# Patient Record
Sex: Male | Born: 1960 | Race: White | Hispanic: No | Marital: Single
Health system: Southern US, Community
[De-identification: ages and names within clinical notes are randomized; demographics above are authoritative.]

## PROBLEM LIST (undated history)

## (undated) ENCOUNTER — Emergency Department (HOSPITAL_COMMUNITY): Admission: EM | Payer: No Typology Code available for payment source | Source: Home / Self Care

## (undated) DIAGNOSIS — F1010 Alcohol abuse, uncomplicated: Secondary | ICD-10-CM

## (undated) DIAGNOSIS — F329 Major depressive disorder, single episode, unspecified: Secondary | ICD-10-CM

## (undated) DIAGNOSIS — F32A Depression, unspecified: Secondary | ICD-10-CM

## (undated) DIAGNOSIS — Z789 Other specified health status: Secondary | ICD-10-CM

## (undated) HISTORY — PX: HAND SURGERY: SHX662

## (undated) HISTORY — PX: NO PAST SURGERIES: SHX2092

---
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
## 2009-07-26 ENCOUNTER — Emergency Department (HOSPITAL_COMMUNITY): Admission: EM | Admit: 2009-07-26 | Discharge: 2009-07-26 | Payer: Self-pay | Admitting: Emergency Medicine

## 2009-08-04 ENCOUNTER — Ambulatory Visit (HOSPITAL_COMMUNITY): Admission: RE | Admit: 2009-08-04 | Discharge: 2009-08-04 | Payer: Self-pay | Admitting: Orthopedic Surgery

## 2010-08-15 LAB — CBC
MCV: 89.9 fL (ref 78.0–100.0)
Platelets: 226 10*3/uL (ref 150–400)
RDW: 14.8 % (ref 11.5–15.5)
WBC: 6.5 10*3/uL (ref 4.0–10.5)

## 2012-12-02 ENCOUNTER — Emergency Department (HOSPITAL_COMMUNITY)
Admission: EM | Admit: 2012-12-02 | Discharge: 2012-12-03 | Disposition: A | Discharge status: Home / Self Care | Payer: No Typology Code available for payment source | Attending: Emergency Medicine | Admitting: Emergency Medicine

## 2012-12-02 ENCOUNTER — Emergency Department (HOSPITAL_COMMUNITY): Payer: No Typology Code available for payment source

## 2012-12-02 ENCOUNTER — Encounter (HOSPITAL_COMMUNITY): Payer: Self-pay | Admitting: *Deleted

## 2012-12-02 DIAGNOSIS — R413 Other amnesia: Secondary | ICD-10-CM | POA: Insufficient documentation

## 2012-12-02 DIAGNOSIS — S62644A Nondisplaced fracture of proximal phalanx of right ring finger, initial encounter for closed fracture: Secondary | ICD-10-CM

## 2012-12-02 DIAGNOSIS — M542 Cervicalgia: Secondary | ICD-10-CM | POA: Insufficient documentation

## 2012-12-02 DIAGNOSIS — Y92410 Unspecified street and highway as the place of occurrence of the external cause: Secondary | ICD-10-CM | POA: Insufficient documentation

## 2012-12-02 DIAGNOSIS — R1032 Left lower quadrant pain: Secondary | ICD-10-CM | POA: Insufficient documentation

## 2012-12-02 DIAGNOSIS — S59919A Unspecified injury of unspecified forearm, initial encounter: Secondary | ICD-10-CM | POA: Insufficient documentation

## 2012-12-02 DIAGNOSIS — F1010 Alcohol abuse, uncomplicated: Secondary | ICD-10-CM | POA: Insufficient documentation

## 2012-12-02 DIAGNOSIS — Y9389 Activity, other specified: Secondary | ICD-10-CM | POA: Insufficient documentation

## 2012-12-02 DIAGNOSIS — S62329A Displaced fracture of shaft of unspecified metacarpal bone, initial encounter for closed fracture: Secondary | ICD-10-CM | POA: Insufficient documentation

## 2012-12-02 DIAGNOSIS — S62356A Nondisplaced fracture of shaft of fifth metacarpal bone, right hand, initial encounter for closed fracture: Secondary | ICD-10-CM

## 2012-12-02 DIAGNOSIS — F17200 Nicotine dependence, unspecified, uncomplicated: Secondary | ICD-10-CM | POA: Insufficient documentation

## 2012-12-02 DIAGNOSIS — S59909A Unspecified injury of unspecified elbow, initial encounter: Secondary | ICD-10-CM | POA: Insufficient documentation

## 2012-12-02 DIAGNOSIS — F10129 Alcohol abuse with intoxication, unspecified: Secondary | ICD-10-CM

## 2012-12-02 DIAGNOSIS — S298XXA Other specified injuries of thorax, initial encounter: Secondary | ICD-10-CM | POA: Insufficient documentation

## 2012-12-02 DIAGNOSIS — M546 Pain in thoracic spine: Secondary | ICD-10-CM | POA: Insufficient documentation

## 2012-12-02 DIAGNOSIS — S62640A Nondisplaced fracture of proximal phalanx of right index finger, initial encounter for closed fracture: Secondary | ICD-10-CM

## 2012-12-02 DIAGNOSIS — R404 Transient alteration of awareness: Secondary | ICD-10-CM | POA: Insufficient documentation

## 2012-12-02 DIAGNOSIS — M25519 Pain in unspecified shoulder: Secondary | ICD-10-CM | POA: Insufficient documentation

## 2012-12-02 DIAGNOSIS — R0789 Other chest pain: Secondary | ICD-10-CM | POA: Insufficient documentation

## 2012-12-02 DIAGNOSIS — Y998 Other external cause status: Secondary | ICD-10-CM | POA: Insufficient documentation

## 2012-12-02 DIAGNOSIS — S6990XA Unspecified injury of unspecified wrist, hand and finger(s), initial encounter: Secondary | ICD-10-CM | POA: Insufficient documentation

## 2012-12-02 DIAGNOSIS — IMO0002 Reserved for concepts with insufficient information to code with codable children: Secondary | ICD-10-CM | POA: Insufficient documentation

## 2012-12-02 DIAGNOSIS — F10229 Alcohol dependence with intoxication, unspecified: Secondary | ICD-10-CM | POA: Insufficient documentation

## 2012-12-02 HISTORY — DX: Alcohol abuse, uncomplicated: F10.10

## 2012-12-02 LAB — COMPREHENSIVE METABOLIC PANEL
ALT: 25 U/L (ref 0–53)
BUN: 8 mg/dL (ref 6–23)
Creatinine, Ser: 0.69 mg/dL (ref 0.50–1.35)
GFR calc Af Amer: >90 mL/min (ref >90)
Glucose, Bld: 80 mg/dL (ref 70–99)
Potassium: 3.8 mEq/L (ref 3.5–5.1)
Sodium: 142 mEq/L (ref 135–145)
Total Bilirubin: 0.3 mg/dL (ref 0.3–1.2)

## 2012-12-02 LAB — CBC WITH DIFFERENTIAL/PLATELET
HCT: 38.6 % — ABNORMAL LOW (ref 39.0–52.0)
Hemoglobin: 13.5 g/dL (ref 13.0–17.0)
MCH: 31.5 pg (ref 26.0–34.0)
MCHC: 35 g/dL (ref 30.0–36.0)
MCV: 90 fL (ref 78.0–100.0)
Neutro Abs: 4.7 10*3/uL (ref 1.7–7.7)
RDW: 14.3 % (ref 11.5–15.5)
WBC: 7.5 10*3/uL (ref 4.0–10.5)

## 2012-12-02 LAB — ETHANOL: Alcohol, Ethyl (B): 242 mg/dL — ABNORMAL HIGH (ref 0–11)

## 2012-12-02 MED ORDER — IOHEXOL 300 MG/ML  SOLN
100.0000 mL | Freq: Once | INTRAMUSCULAR | Status: AC | PRN
  Administered 2012-12-02: 100 mL via INTRAVENOUS
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
## 2012-12-03 ENCOUNTER — Encounter (HOSPITAL_COMMUNITY): Payer: Self-pay | Admitting: *Deleted

## 2012-12-03 ENCOUNTER — Emergency Department (HOSPITAL_COMMUNITY): Payer: No Typology Code available for payment source

## 2012-12-03 DIAGNOSIS — F10129 Alcohol abuse with intoxication, unspecified: Secondary | ICD-10-CM | POA: Diagnosis present

## 2012-12-03 MED ORDER — TRAMADOL HCL 50 MG PO TABS
50.0000 mg | ORAL_TABLET | Freq: Four times a day (QID) | ORAL | Status: DC | PRN
Start: 2012-12-03 — End: 2012-12-19

## 2012-12-03 NOTE — ED Notes (Signed)
Patient being taken to xray at this time.

## 2012-12-03 NOTE — Progress Notes (Signed)
Orthopedic Tech Progress Note Patient Details:  Andrew Rivas 10-21-1960 [id number] Ulna gutter splint with radial gutter applied to Right UE to stabilize 2nd 4th and 5th digits. Fractures in all 3. Patient tolerated application well. Care instructions explained. Patient was asked if he would like arm sling as well and stated he would be fine without one.  Ortho Devices Type of Ortho Device: Rad Gutter splint;Ulna gutter splint Ortho Device/Splint Location: Right UE Ortho Device/Splint Interventions: Application   Asia R Thompson 12/03/2012, 8:16 AM

## 2012-12-03 NOTE — ED Provider Notes (Addendum)
Patient has been sleeping throughout the night. He apparently took off his cervical collar without anybody. On exam, neck is nontender with normal range of motion. I do not feel he needs any additional imaging of the cervical spine and he is discharged.  Dione Booze, MD 12/03/12 [phone]  As he was getting ready to be discharged, patient started complaining of pain in his right hand which was noted to be swollen. He was sent for x-rays which showed a fracture through the fifth metacarpal with minimal angulation, and intra-articular fracture is of the proximal phalanges of the second third fingers. He was placed in an ulnar gutter splint and referred to hand surgery for followup. Prescription will be given for, call for pain.  Dione Booze, MD 12/03/12 [phone]
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
## 2012-12-03 NOTE — ED Notes (Signed)
Xray results are noted,  ERMD aware of fractures

## 2012-12-03 NOTE — ED Notes (Signed)
Patient is now alert and oriented.  He is complaining of pain in his right hand,  States he can barely move the hand due to pain.  Swelling is noted.  Xray ordered

## 2012-12-19 ENCOUNTER — Emergency Department (HOSPITAL_COMMUNITY)
Admission: EM | Admit: 2012-12-19 | Discharge: 2012-12-19 | Disposition: A | Discharge status: Home / Self Care | Payer: Self-pay | Attending: Emergency Medicine | Admitting: Emergency Medicine

## 2012-12-19 ENCOUNTER — Emergency Department (HOSPITAL_COMMUNITY): Payer: Self-pay

## 2012-12-19 ENCOUNTER — Encounter (HOSPITAL_COMMUNITY): Payer: Self-pay | Admitting: Emergency Medicine

## 2012-12-19 DIAGNOSIS — J3489 Other specified disorders of nose and nasal sinuses: Secondary | ICD-10-CM | POA: Insufficient documentation

## 2012-12-19 DIAGNOSIS — J069 Acute upper respiratory infection, unspecified: Secondary | ICD-10-CM | POA: Insufficient documentation

## 2012-12-19 DIAGNOSIS — F17200 Nicotine dependence, unspecified, uncomplicated: Secondary | ICD-10-CM | POA: Insufficient documentation

## 2012-12-19 DIAGNOSIS — R05 Cough: Secondary | ICD-10-CM | POA: Insufficient documentation

## 2012-12-19 DIAGNOSIS — R059 Cough, unspecified: Secondary | ICD-10-CM | POA: Insufficient documentation

## 2012-12-19 DIAGNOSIS — F1010 Alcohol abuse, uncomplicated: Secondary | ICD-10-CM | POA: Insufficient documentation

## 2012-12-19 MED ORDER — ALBUTEROL SULFATE HFA 108 (90 BASE) MCG/ACT IN AERS
2.0000 | INHALATION_SPRAY | RESPIRATORY_TRACT | Status: DC | PRN
  Administered 2012-12-19: 2 via RESPIRATORY_TRACT
  Filled 2012-12-19: qty 6.7

## 2012-12-19 NOTE — ED Provider Notes (Signed)
CSN: [id number]     Arrival date & time 12/19/12  0801 History     First MD Initiated Contact with Patient 12/19/12 [phone]     Chief Complaint  Patient presents with  . URI   (Consider location/radiation/quality/duration/timing/severity/associated sxs/prior Treatment) Patient is a 52 y.o. male presenting with URI. The history is provided by the patient. No language interpreter was used.  URI Presenting symptoms: congestion and cough   Severity:  Mild Onset quality:  Gradual Duration:  2 weeks Timing:  Intermittent Progression:  Partially resolved Chronicity:  New Relieved by:  Decongestant Worsened by:  Nothing tried Associated symptoms: sinus pain   Risk factors: chronic respiratory disease     Past Medical History  Diagnosis Date  . Alcohol abuse    Past Surgical History  Procedure Laterality Date  . Hand surgery Left    No family history on file. History  Substance Use Topics  . Smoking status: Current Every Day Smoker  . Smokeless tobacco: Not on file  . Alcohol Use: Yes     Comment: admits 2-3 beers daily but today had 12 pack    Review of Systems  HENT: Positive for congestion.   Respiratory: Positive for cough.   All other systems reviewed and are negative.    Allergies  Review of patient's allergies indicates no known allergies.  Home Medications   Current Outpatient Rx  Name  Route  Sig  Dispense  Refill  . guaiFENesin (MUCINEX) 600 MG 12 hr tablet   Oral   Take 1,200 mg by mouth 2 (two) times daily as needed for congestion.          BP 118/70  Pulse 82  Temp(Src) 98.7 F (37.1 C)  Resp 19  SpO2 92% Physical Exam  Nursing note and vitals reviewed. Constitutional: He is oriented to person, place, and time. He appears well-developed and well-nourished.  HENT:  Head: Normocephalic and atraumatic.  Right Ear: External ear normal.  Left Ear: External ear normal.  Nose: Nose normal.  Mouth/Throat: Oropharynx is clear and moist. No  oropharyngeal exudate.  Eyes: Conjunctivae and EOM are normal. Pupils are equal, round, and reactive to light. Right eye exhibits no discharge. Left eye exhibits no discharge. No scleral icterus.  Neck: Normal range of motion. Neck supple. No JVD present.  Cardiovascular: Normal rate, regular rhythm, normal heart sounds and intact distal pulses.  Exam reveals no gallop and no friction rub.   No murmur heard. Pulmonary/Chest: Effort normal and breath sounds normal. No respiratory distress. He has no wheezes. He has no rales. He exhibits no tenderness.  Abdominal: Soft. Bowel sounds are normal. He exhibits no distension and no mass. There is no tenderness. There is no rebound and no guarding.  Musculoskeletal: Normal range of motion. He exhibits no edema and no tenderness.  Neurological: He is alert and oriented to person, place, and time.  CN 3-12 intact  Skin: Skin is warm and dry.  Psychiatric: He has a normal mood and affect. His behavior is normal. Judgment and thought content normal.    ED Course   Procedures (including critical care time)  Labs Reviewed - No data to display Dg Chest 2 View  12/19/2012   *RADIOLOGY REPORT*  Clinical Data: Cough and shortness of breath.  CHEST - 2 VIEW  Comparison: Chest CT 12/02/2012.  Findings: The cardiac silhouette, mediastinal and hilar contours are normal and stable.  The lungs are clear.  No pleural effusion. The bony thorax is intact.  Remote ununited distal clavicle fracture on the right.  IMPRESSION: No acute cardiopulmonary findings.   Original Report Authenticated By: Rudie Meyer, M.D.   1. URI (upper respiratory infection)     MDM  Patient with URI, no evidence of pneumonia.  Will discharge to home with inhaler.  Recommend continuing mucinex.  Afebrile, PERC negative.  VSS.  Roxy Horseman, PA-C 12/19/12 0926  Roxy Horseman, PA-C 12/19/12 [phone]

## 2012-12-19 NOTE — ED Provider Notes (Signed)
Medical screening examination/treatment/procedure(s) were performed by non-physician practitioner and as supervising physician I was immediately available for consultation/collaboration.  Ethelda Chick, MD 12/19/12 [phone]

## 2012-12-19 NOTE — ED Notes (Signed)
[facility] and he has cold and congestion in his nose and chest sputum is yellow/ green he states states he thinks this cold is going around there he coughing alot

## 2016-07-24 ENCOUNTER — Emergency Department (HOSPITAL_COMMUNITY)
Admission: EM | Admit: 2016-07-24 | Discharge: 2016-07-24 | Discharge status: Against Medical Advice | Payer: No Typology Code available for payment source

## 2016-07-24 NOTE — ED Notes (Signed)
Pt. Stated that he was leaving. Pt. Walked out. Pt. Was called x3 for vitals.  Nurses aware.
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
## 2017-01-24 ENCOUNTER — Emergency Department (HOSPITAL_COMMUNITY)
Admission: EM | Admit: 2017-01-24 | Discharge: 2017-01-24 | Disposition: A | Discharge status: Home / Self Care | Payer: Self-pay | Attending: Emergency Medicine | Admitting: Emergency Medicine

## 2017-01-24 ENCOUNTER — Encounter (HOSPITAL_COMMUNITY): Payer: Self-pay | Admitting: Emergency Medicine

## 2017-01-24 DIAGNOSIS — F10920 Alcohol use, unspecified with intoxication, uncomplicated: Secondary | ICD-10-CM | POA: Insufficient documentation

## 2017-01-24 DIAGNOSIS — F17200 Nicotine dependence, unspecified, uncomplicated: Secondary | ICD-10-CM | POA: Insufficient documentation

## 2017-01-24 DIAGNOSIS — F1910 Other psychoactive substance abuse, uncomplicated: Secondary | ICD-10-CM | POA: Insufficient documentation

## 2017-01-24 NOTE — ED Notes (Signed)
Patient ambulated to bathroom with no assistance.

## 2017-01-24 NOTE — Discharge Instructions (Signed)
Substance Abuse Treatment Programs ° °Intensive Outpatient Programs °[facility]     °[address]      °[place], [place]                   °[phone]      ° °[facility] °[address] °[place], [place] °336-379-7146 ° °[facility]     °(Inpatient and outpatient)     °[address]           °[phone]   ° °[facility] °336-288-1484 (Suboxone and Methadone) ° °[address]      °[place], [place] [address]      °336-882-2125      ° °[address] °[place], [place] °852-3033 ° °[facility] (Outpatient/Inpatient, Chemical)    °(insurance only) [phone]      °       °[facility] (Groups & Residential) °[place], [place] °336-389-1413 ° °   °[facility]     °[address]     °[place], [place]      °336-389-1413      ° °[facility] (for caregivers and family) °[address] °[place], [place] °336-299-4655 ° ° ° ° ° °Residential Treatment Programs °[facility]      °[address], [place], [place] [address]  °([phone]      ° °[facility] °[address] [place], [place] [address] °919-419-1059 ° °[facility]        °336-248-8914      ° °[facility] °1-800-659-3381 ° °[facility]             °[address]                                         °[place], [place]                                                °877-615-2722 or 336-784-9470                              ° °[facility] [place] °[address] °[place], [address] °1.877.941.8954 ° °[facility]    °[address]      °[place], [place]     °336-273-5306      ° °[facility] °[address] °[place], [place] °336-285-9073 ° °[facility]   °[address]     °[place], [place] [address]     °336-899-1550      °Admissions: 8am-3pm M-F ° °[facility] °[address] °,  [place] °336-227-7417 ° °[facility]: Residential Program (90 Days)   °[place], [place]      °336-725-8389 or 800-758-6077    ° °[facility] °[place], [place] °(Walk in Hours over the weekend or by referral) ° °[facility] °[address], [place], [place] [address] °(336) [phone] ° °Crisis Mobile: [facility]:  1-877-626-1772 (for crisis response 24 hours a day) °[facility] Hotline:      1-800-256-2452 °Outpatient Psychiatry and Counseling ° °[facility]: Mobile Crisis   Management 24 hours:  1-877-626-1772 ° °[facility] sliding scale fee and walk in schedule: M-F 8am-12pm/1pm-3pm °[address]  °[place], [place] [address] °336-387-6161 ° °[facility] °[address] °[place], [place] [address] °336-703-9650 ° °[facility] (Former[facility] known [facility])- new patient walk-in appointments available Monday - Friday 8am -3pm.          °[address] °[place], [place] [address] °336-676-6840 or crisis line- 336-676-6905 ° °[facility]/ Intensive Outpatient Therapy Program °[address] °[place], [place] [address] °336-832-9804 ° °[facility]                  °Crisis Services      °336.641.4993      °[address]     °[place], [place] [address]                ° °[facility]   °[facility] °[phone] °[address] °[place], [place] [address] ° ° °[facility]          °[address]  °[place], [place] [address]       °(336) [phone] ° °[facility] °[address] °[place], [place] [address] °336-292-1510 ° °[facility]    °[address]    °[place], [place] [address]     °336-632-3505      ° °Parish McKinney, MD     °[address]     °[place] [address]     °336-282-1251     °  °[facility] °[address] °[place] [address] ° °[facility]     °[address]     °[place], [place]      °336-542-2076      ° °[facility] °Shamsher Ahluwalia, MD °[address] °[place], [place] [address] °336-420-9558 ° °[facility]     °[address]     °[place], [place] [address]     °336-274-1237      ° °[facility] °[address] °[place], [place] [address] °336-854-4450 °Resources for Temporary Residential Assistance/Crisis Centers ° °DAY CENTERS °[facility] °M-F 8am-3pm   °[address] [place], [place] [address]   336-332-0824 °Services include: laundry, barbering, support groups, case management, phone  & computer access, showers, AA/NA mtgs, mental health/substance abuse nurse, job skills class, disability information, VA assistance, spiritual classes, etc.  ° °HOMELESS SHELTERS ° °[facility]     °[facility]   °[address], [place]     °336.271.5959       °       °[facility] (women and children)       °[address] °[place], [place] [address] °336-275-0820 °[email or web address] for application and process °Application Required ° °[facility]   °[address]    °[place] [address]     °336.886.4922       °             °[facility] °[address] °[place],  [address] °336.273.5572 °336-235-0363(schedule application appt.) °Application Required ° °[facility] (women only)    °[address]     °[place],  [address]     °336-884-1039      °  Intake starts 6pm daily °Need valid ID, SSC, & Police report °[facility] [place] °[address] °[place], [place] °336-881-5420 °Application Required ° °[facility] (men only)     °[address]      °[place], [place]     °336.748.1962      ° °[facility] °(Pregnant women only) °[address] °[place], [place] °336-275-0206 ° °[facility]      °[address]      °[place], [place] [address]     °336-722-9951      °       °[facility] °[address] °[place], [place] °336-723-1848 °90 day commitment/SA/Application process ° °[facility](men only)     °[address]     °[place], [place]     °336-748-1962       °Check-in at 7pm     °       °[facility] °[address] °[place], [place] [address] °336-248-6684 °Men/Women/Women and Children must be there by 7 pm ° °[facility] °[place], [place] °336-722-8721                ° °
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
## 2017-01-24 NOTE — ED Triage Notes (Signed)
Pt comes from the bathroom at [employer] via EMS after bystanders called after finding him during closing asleep.  Pt reports drinking 24 beers and taking hydrocodone that was not prescribed to him.  Pt laughing and in no acute distress in triage. Vitals [facility]NL in route. Ambulatory on arrival.

## 2017-09-23 ENCOUNTER — Emergency Department (HOSPITAL_COMMUNITY)
Admission: EM | Admit: 2017-09-23 | Discharge: 2017-09-23 | Disposition: A | Discharge status: Home / Self Care | Payer: Self-pay | Attending: Emergency Medicine | Admitting: Emergency Medicine

## 2017-09-23 ENCOUNTER — Encounter (HOSPITAL_COMMUNITY): Payer: Self-pay

## 2017-09-23 ENCOUNTER — Emergency Department (HOSPITAL_COMMUNITY): Payer: Self-pay

## 2017-09-23 ENCOUNTER — Other Ambulatory Visit: Payer: Self-pay

## 2017-09-23 DIAGNOSIS — Z590 Homelessness: Secondary | ICD-10-CM | POA: Insufficient documentation

## 2017-09-23 DIAGNOSIS — R531 Weakness: Secondary | ICD-10-CM | POA: Insufficient documentation

## 2017-09-23 DIAGNOSIS — R2681 Unsteadiness on feet: Secondary | ICD-10-CM | POA: Insufficient documentation

## 2017-09-23 DIAGNOSIS — R569 Unspecified convulsions: Secondary | ICD-10-CM | POA: Insufficient documentation

## 2017-09-23 DIAGNOSIS — F17200 Nicotine dependence, unspecified, uncomplicated: Secondary | ICD-10-CM | POA: Insufficient documentation

## 2017-09-23 LAB — URINALYSIS, ROUTINE W REFLEX MICROSCOPIC
BILIRUBIN URINE: NEGATIVE
GLUCOSE, UA: NEGATIVE mg/dL
HGB URINE DIPSTICK: NEGATIVE
Leukocytes, UA: NEGATIVE
Nitrite: NEGATIVE
PH: 5.5 (ref 5.0–8.0)
PROTEIN: NEGATIVE mg/dL
Specific Gravity, Urine: 1.025 (ref 1.005–1.030)

## 2017-09-23 LAB — CBC WITH DIFFERENTIAL/PLATELET
BASOS ABS: 0 10*3/uL (ref 0.0–0.1)
Basophils Relative: 1 %
EOS PCT: 1 %
Eosinophils Absolute: 0.1 10*3/uL (ref 0.0–0.7)
HEMATOCRIT: 42.8 % (ref 39.0–52.0)
Hemoglobin: 15.1 g/dL (ref 13.0–17.0)
LYMPHS ABS: 1.2 10*3/uL (ref 0.7–4.0)
LYMPHS PCT: 14 %
MCH: 31.5 pg (ref 26.0–34.0)
MCHC: 35.3 g/dL (ref 30.0–36.0)
MCV: 89.4 fL (ref 78.0–100.0)
MONO ABS: 0.6 10*3/uL (ref 0.1–1.0)
Monocytes Relative: 7 %
NEUTROS ABS: 6.5 10*3/uL (ref 1.7–7.7)
Neutrophils Relative %: 77 %
Platelets: 268 10*3/uL (ref 150–400)
RBC: 4.79 MIL/uL (ref 4.22–5.81)
RDW: 14.6 % (ref 11.5–15.5)
WBC: 8.4 10*3/uL (ref 4.0–10.5)

## 2017-09-23 LAB — COMPREHENSIVE METABOLIC PANEL
ALT: 40 U/L (ref 17–63)
AST: 53 U/L — ABNORMAL HIGH (ref 15–41)
Albumin: 3.8 g/dL (ref 3.5–5.0)
Alkaline Phosphatase: 76 U/L (ref 38–126)
Anion gap: 15 (ref 5–15)
BILIRUBIN TOTAL: 0.5 mg/dL (ref 0.3–1.2)
BUN: 10 mg/dL (ref 6–20)
CO2: 22 mmol/L (ref 22–32)
CREATININE: 0.64 mg/dL (ref 0.61–1.24)
Calcium: 8.2 mg/dL — ABNORMAL LOW (ref 8.9–10.3)
Chloride: 105 mmol/L (ref 101–111)
Glucose, Bld: 82 mg/dL (ref 65–99)
POTASSIUM: 4.3 mmol/L (ref 3.5–5.1)
Sodium: 142 mmol/L (ref 135–145)
TOTAL PROTEIN: 7 g/dL (ref 6.5–8.1)

## 2017-09-23 LAB — MAGNESIUM: MAGNESIUM: 1.8 mg/dL (ref 1.7–2.4)

## 2017-09-23 LAB — ETHANOL: ALCOHOL ETHYL (B): 108 mg/dL — AB (ref <10)

## 2017-09-23 LAB — AMMONIA: Ammonia: 28 umol/L (ref 9–35)

## 2017-09-23 LAB — CBG MONITORING, ED: Glucose-Capillary: 75 mg/dL (ref 65–99)

## 2017-09-23 LAB — CK: CK TOTAL: 486 U/L — AB (ref 49–397)

## 2017-09-23 MED ORDER — LACTATED RINGERS IV BOLUS
1000.0000 mL | Freq: Once | INTRAVENOUS | Status: AC
  Administered 2017-09-23: 1000 mL via INTRAVENOUS

## 2017-09-23 MED ORDER — SODIUM CHLORIDE 0.9 % IV SOLN
1.0000 mg | Freq: Once | INTRAVENOUS | Status: AC
Start: 2017-09-23 — End: 2017-09-23
  Administered 2017-09-23: 1 mg via INTRAVENOUS
  Filled 2017-09-23: qty 0.2

## 2017-09-23 MED ORDER — LORAZEPAM 1 MG PO TABS
1.0000 mg | ORAL_TABLET | Freq: Once | ORAL | Status: AC
  Administered 2017-09-23: 1 mg via ORAL
  Filled 2017-09-23: qty 1

## 2017-09-23 MED ORDER — THIAMINE HCL 100 MG/ML IJ SOLN
100.0000 mg | Freq: Once | INTRAMUSCULAR | Status: AC
  Administered 2017-09-23: 100 mg via INTRAVENOUS
  Filled 2017-09-23: qty 2

## 2017-09-23 MED ORDER — LORAZEPAM 2 MG/ML IJ SOLN
2.0000 mg | Freq: Once | INTRAMUSCULAR | Status: AC
  Administered 2017-09-23: 2 mg via INTRAVENOUS
  Filled 2017-09-23: qty 1
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
## 2017-09-23 NOTE — ED Notes (Signed)
Pt ambulated over 30 ft w/o assist  .  Pt exemplified slight unsteady gait.  Pt stated that he felt a little weak.

## 2017-09-23 NOTE — ED Notes (Signed)
He is able to eat/drink, however, he is too unsteady to ambulate.

## 2017-09-23 NOTE — ED Notes (Signed)
Pt tried to stand for a few minutes to urinate but was not steady on his feet.   Pt stated that he felt weak.  Pt was able to intake 120cc and asked for more.  Pt is eating at present.

## 2017-09-23 NOTE — ED Notes (Signed)
Pt is sleep and not easy to arouse.  Will do fluid challenge when pt is not sleep.   RN informed.
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
## 2017-09-23 NOTE — ED Notes (Signed)
Bed: ZO10 Expected date:  Expected time:  Means of arrival:  Comments: 57 yo seizure

## 2017-09-23 NOTE — ED Notes (Signed)
Pt had a sandwich and two carton of  milk

## 2017-09-23 NOTE — Discharge Instructions (Signed)
Start taking a multivitamin with Thiamine.  Call one of the numbers provided for help with rehabilitation.

## 2017-09-26 ENCOUNTER — Emergency Department (HOSPITAL_COMMUNITY)
Admission: EM | Admit: 2017-09-26 | Discharge: 2017-09-28 | Disposition: A | Discharge status: Other Acute Inpatient Hospital | Payer: Self-pay | Attending: Emergency Medicine | Admitting: Emergency Medicine

## 2017-09-26 ENCOUNTER — Other Ambulatory Visit: Payer: Self-pay

## 2017-09-26 DIAGNOSIS — F17200 Nicotine dependence, unspecified, uncomplicated: Secondary | ICD-10-CM | POA: Insufficient documentation

## 2017-09-26 DIAGNOSIS — F1010 Alcohol abuse, uncomplicated: Secondary | ICD-10-CM | POA: Insufficient documentation

## 2017-09-26 DIAGNOSIS — R45851 Suicidal ideations: Secondary | ICD-10-CM | POA: Insufficient documentation

## 2017-09-26 DIAGNOSIS — F332 Major depressive disorder, recurrent severe without psychotic features: Secondary | ICD-10-CM | POA: Insufficient documentation

## 2017-09-26 LAB — ACETAMINOPHEN LEVEL

## 2017-09-26 LAB — COMPREHENSIVE METABOLIC PANEL
ALT: 46 U/L (ref 17–63)
AST: 71 U/L — ABNORMAL HIGH (ref 15–41)
Albumin: 3.6 g/dL (ref 3.5–5.0)
Alkaline Phosphatase: 74 U/L (ref 38–126)
Anion gap: 13 (ref 5–15)
BUN: 6 mg/dL (ref 6–20)
CHLORIDE: 100 mmol/L — AB (ref 101–111)
CO2: 21 mmol/L — AB (ref 22–32)
Calcium: 8.2 mg/dL — ABNORMAL LOW (ref 8.9–10.3)
Creatinine, Ser: 0.74 mg/dL (ref 0.61–1.24)
GFR calc non Af Amer: >60 mL/min (ref >60)
Glucose, Bld: 77 mg/dL (ref 65–99)
Potassium: 3.9 mmol/L (ref 3.5–5.1)
SODIUM: 134 mmol/L — AB (ref 135–145)
Total Bilirubin: 0.7 mg/dL (ref 0.3–1.2)
Total Protein: 6.1 g/dL — ABNORMAL LOW (ref 6.5–8.1)

## 2017-09-26 LAB — CBC WITH DIFFERENTIAL/PLATELET
BASOS PCT: 1 %
Basophils Absolute: 0 10*3/uL (ref 0.0–0.1)
EOS ABS: 0.2 10*3/uL (ref 0.0–0.7)
Eosinophils Relative: 4 %
HEMATOCRIT: 38.9 % — AB (ref 39.0–52.0)
HEMOGLOBIN: 13.9 g/dL (ref 13.0–17.0)
Lymphocytes Relative: 32 %
Lymphs Abs: 1.9 10*3/uL (ref 0.7–4.0)
MCH: 31.4 pg (ref 26.0–34.0)
MCHC: 35.7 g/dL (ref 30.0–36.0)
MCV: 87.8 fL (ref 78.0–100.0)
MONO ABS: 0.4 10*3/uL (ref 0.1–1.0)
Monocytes Relative: 6 %
NEUTROS ABS: 3.3 10*3/uL (ref 1.7–7.7)
Neutrophils Relative %: 57 %
Platelets: 204 10*3/uL (ref 150–400)
RBC: 4.43 MIL/uL (ref 4.22–5.81)
RDW: 14.7 % (ref 11.5–15.5)
WBC: 5.7 10*3/uL (ref 4.0–10.5)

## 2017-09-26 LAB — SALICYLATE LEVEL

## 2017-09-26 LAB — ETHANOL: ALCOHOL ETHYL (B): 298 mg/dL — AB (ref <10)

## 2017-09-26 MED ORDER — LORAZEPAM 2 MG/ML IJ SOLN: 0.0000 mg | Freq: Two times a day (BID) | INTRAMUSCULAR | Status: DC

## 2017-09-26 MED ORDER — LORAZEPAM 2 MG/ML IJ SOLN: 0.0000 mg | Freq: Four times a day (QID) | INTRAMUSCULAR | Status: DC

## 2017-09-26 MED ORDER — LORAZEPAM 1 MG PO TABS
0.0000 mg | ORAL_TABLET | Freq: Four times a day (QID) | ORAL | Status: DC
  Administered 2017-09-26 – 2017-09-28 (×3): 1 mg via ORAL
  Filled 2017-09-26 (×3): qty 1

## 2017-09-26 MED ORDER — VITAMIN B-1 100 MG PO TABS
100.0000 mg | ORAL_TABLET | Freq: Every day | ORAL | Status: DC
  Administered 2017-09-27 – 2017-09-28 (×2): 100 mg via ORAL
  Filled 2017-09-26 (×2): qty 1

## 2017-09-26 MED ORDER — THIAMINE HCL 100 MG/ML IJ SOLN: 100.0000 mg | Freq: Every day | INTRAMUSCULAR | Status: DC

## 2017-09-26 MED ORDER — LORAZEPAM 1 MG PO TABS: 0.0000 mg | ORAL_TABLET | Freq: Two times a day (BID) | ORAL | Status: DC
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
## 2017-09-26 NOTE — ED Notes (Signed)
TTS in process-Monique,RN  

## 2017-09-27 LAB — RAPID URINE DRUG SCREEN, HOSP PERFORMED
Amphetamines: NOT DETECTED
Barbiturates: NOT DETECTED
Benzodiazepines: NOT DETECTED
Cocaine: NOT DETECTED
OPIATES: NOT DETECTED
Tetrahydrocannabinol: NOT DETECTED

## 2017-09-27 LAB — URINALYSIS, ROUTINE W REFLEX MICROSCOPIC
Bilirubin Urine: NEGATIVE
Glucose, UA: NEGATIVE mg/dL
Hgb urine dipstick: NEGATIVE
Ketones, ur: 5 mg/dL — AB
LEUKOCYTES UA: NEGATIVE
NITRITE: NEGATIVE
PH: 5 (ref 5.0–8.0)
Protein, ur: NEGATIVE mg/dL
SPECIFIC GRAVITY, URINE: 1.011 (ref 1.005–1.030)

## 2017-09-27 MED ORDER — IBUPROFEN 400 MG PO TABS
600.0000 mg | ORAL_TABLET | Freq: Four times a day (QID) | ORAL | Status: DC | PRN
  Administered 2017-09-27 – 2017-09-28 (×2): 600 mg via ORAL
  Filled 2017-09-27 (×2): qty 1

## 2017-09-27 NOTE — ED Notes (Signed)
Pt c/o having the shakes, CWA was 9, pt medicated and TTS being dione now

## 2017-09-27 NOTE — ED Notes (Signed)
Dr Jeraldine Loots into remove tick from pts rt arm pit

## 2017-09-27 NOTE — ED Notes (Addendum)
Given coke cola   To drink states feels much better

## 2017-09-27 NOTE — Progress Notes (Signed)
Pt. meets criteria for inpatient treatment per Donell Sievert, PA.  Referred out to the following hospitals:  [facility]  [facility]  [facility]  [facility]       Disposition CSW will continue to follow for placement.  Timmothy Euler. Kaylyn Lim, MSW, LCSWA Disposition Clinical Social Work [phone] (cell) [phone] (office)

## 2017-09-27 NOTE — ED Provider Notes (Signed)
1:59 PM Patient awaiting psychiatric placement. He states that he is physically comfortable, though he does have a take he just found in his right under arm. After obtaining verbal consent to remove the tick, I did so.  Procedure well-tolerated.  .Foreign Body Removal Date/Time: 09/27/2017 2:00 PM Performed by: Gerhard Munch, MD Authorized by: Gerhard Munch, MD  Consent: The procedure was performed in an emergent situation. Verbal consent obtained. Risks and benefits: risks, benefits and alternatives were discussed Consent given by: patient Patient consent: the patient's understanding of the procedure matches consent given Procedure consent: procedure consent matches procedure scheduled Required items: required blood products, implants, devices, and special equipment available Patient identity confirmed: verbally with patient Time out: Immediately prior to procedure a "time out" was called to verify the correct patient, procedure, equipment, support staff and site/side marked as required. Body area: skin General location: trunk Location details: right axilla  Sedation: Patient sedated: no  Patient restrained: no Patient cooperative: yes Localization method: visualized Removal mechanism: forceps Dressing: dressing applied Tendon involvement: none Depth: subcutaneous Complexity: simple 1 objects recovered. Objects recovered: tick Post-procedure assessment: foreign body removed Patient tolerance: Patient tolerated the procedure well with no immediate complications      Gerhard Munch, MD 09/27/17 1402

## 2017-09-27 NOTE — [facility] Counselor (Signed)
Patient has visible tremors. He reports he feels "weak and shaky". Pt says was unable to eat this morning. He reports he has probably had seizures in the past when withdrawing from alcohol. Pt endorses suicide. He reports he tried to kill himself last night when he drank a lot of alcohol and ingested four or five opioid pills. Pt says he would sign himself in as voluntary as a psychiatric inpatient facility. He says he is worried he might try to kill himself if he is discharged. Patient continues to meet criteria for inpatient treatment.   Evette Cristal, [place] Therapeutic Triage Specialist

## 2017-09-28 ENCOUNTER — Encounter (HOSPITAL_COMMUNITY): Payer: Self-pay

## 2017-09-28 ENCOUNTER — Inpatient Hospital Stay (HOSPITAL_COMMUNITY)
Admission: AD | Admit: 2017-09-28 | Discharge: 2017-10-03 | DRG: 885 | Disposition: A | Discharge status: Home / Self Care | Payer: No Typology Code available for payment source | Source: Intra-hospital | Attending: Psychiatry | Admitting: Psychiatry

## 2017-09-28 ENCOUNTER — Other Ambulatory Visit: Payer: Self-pay

## 2017-09-28 DIAGNOSIS — F322 Major depressive disorder, single episode, severe without psychotic features: Secondary | ICD-10-CM | POA: Diagnosis not present

## 2017-09-28 DIAGNOSIS — R450 Nervousness: Secondary | ICD-10-CM | POA: Diagnosis not present

## 2017-09-28 DIAGNOSIS — Z653 Problems related to other legal circumstances: Secondary | ICD-10-CM | POA: Diagnosis not present

## 2017-09-28 DIAGNOSIS — Z818 Family history of other mental and behavioral disorders: Secondary | ICD-10-CM | POA: Diagnosis not present

## 2017-09-28 DIAGNOSIS — Y908 Blood alcohol level of 240 mg/100 ml or more: Secondary | ICD-10-CM | POA: Diagnosis present

## 2017-09-28 DIAGNOSIS — F10239 Alcohol dependence with withdrawal, unspecified: Secondary | ICD-10-CM | POA: Diagnosis present

## 2017-09-28 DIAGNOSIS — F17210 Nicotine dependence, cigarettes, uncomplicated: Secondary | ICD-10-CM | POA: Diagnosis present

## 2017-09-28 DIAGNOSIS — R45851 Suicidal ideations: Secondary | ICD-10-CM | POA: Diagnosis present

## 2017-09-28 DIAGNOSIS — F1590 Other stimulant use, unspecified, uncomplicated: Secondary | ICD-10-CM | POA: Diagnosis not present

## 2017-09-28 DIAGNOSIS — F10129 Alcohol abuse with intoxication, unspecified: Secondary | ICD-10-CM | POA: Diagnosis not present

## 2017-09-28 DIAGNOSIS — F419 Anxiety disorder, unspecified: Secondary | ICD-10-CM | POA: Diagnosis present

## 2017-09-28 DIAGNOSIS — F1024 Alcohol dependence with alcohol-induced mood disorder: Secondary | ICD-10-CM | POA: Diagnosis present

## 2017-09-28 DIAGNOSIS — Z560 Unemployment, unspecified: Secondary | ICD-10-CM | POA: Diagnosis not present

## 2017-09-28 DIAGNOSIS — T1491XA Suicide attempt, initial encounter: Secondary | ICD-10-CM | POA: Diagnosis not present

## 2017-09-28 DIAGNOSIS — F1910 Other psychoactive substance abuse, uncomplicated: Secondary | ICD-10-CM | POA: Diagnosis present

## 2017-09-28 DIAGNOSIS — F1020 Alcohol dependence, uncomplicated: Secondary | ICD-10-CM

## 2017-09-28 DIAGNOSIS — X780XXA Intentional self-harm by sharp glass, initial encounter: Secondary | ICD-10-CM | POA: Diagnosis not present

## 2017-09-28 DIAGNOSIS — Z590 Homelessness: Secondary | ICD-10-CM | POA: Diagnosis not present

## 2017-09-28 DIAGNOSIS — R454 Irritability and anger: Secondary | ICD-10-CM | POA: Diagnosis not present

## 2017-09-28 DIAGNOSIS — F339 Major depressive disorder, recurrent, unspecified: Secondary | ICD-10-CM | POA: Diagnosis present

## 2017-09-28 DIAGNOSIS — M545 Low back pain: Secondary | ICD-10-CM | POA: Diagnosis not present

## 2017-09-28 DIAGNOSIS — G4700 Insomnia, unspecified: Secondary | ICD-10-CM | POA: Diagnosis present

## 2017-09-28 DIAGNOSIS — F1490 Cocaine use, unspecified, uncomplicated: Secondary | ICD-10-CM | POA: Diagnosis not present

## 2017-09-28 DIAGNOSIS — F332 Major depressive disorder, recurrent severe without psychotic features: Principal | ICD-10-CM | POA: Diagnosis present

## 2017-09-28 DIAGNOSIS — R4587 Impulsiveness: Secondary | ICD-10-CM | POA: Diagnosis not present

## 2017-09-28 DIAGNOSIS — F1190 Opioid use, unspecified, uncomplicated: Secondary | ICD-10-CM | POA: Diagnosis not present

## 2017-09-28 DIAGNOSIS — F1290 Cannabis use, unspecified, uncomplicated: Secondary | ICD-10-CM | POA: Diagnosis not present

## 2017-09-28 DIAGNOSIS — R456 Violent behavior: Secondary | ICD-10-CM | POA: Diagnosis not present

## 2017-09-28 HISTORY — DX: Other specified health status: Z78.9

## 2017-09-28 MED ORDER — HYDROXYZINE HCL 25 MG PO TABS
25.0000 mg | ORAL_TABLET | Freq: Four times a day (QID) | ORAL | Status: AC | PRN
  Administered 2017-09-29 – 2017-09-30 (×2): 25 mg via ORAL
  Filled 2017-09-28 (×2): qty 1

## 2017-09-28 MED ORDER — CHLORDIAZEPOXIDE HCL 25 MG PO CAPS
25.0000 mg | ORAL_CAPSULE | Freq: Three times a day (TID) | ORAL | Status: AC
  Administered 2017-09-29 (×3): 25 mg via ORAL
  Filled 2017-09-28 (×3): qty 1

## 2017-09-28 MED ORDER — CHLORDIAZEPOXIDE HCL 25 MG PO CAPS
25.0000 mg | ORAL_CAPSULE | Freq: Every day | ORAL | Status: AC
  Administered 2017-10-01: 25 mg via ORAL
  Filled 2017-09-28: qty 1

## 2017-09-28 MED ORDER — CHLORDIAZEPOXIDE HCL 25 MG PO CAPS
25.0000 mg | ORAL_CAPSULE | Freq: Four times a day (QID) | ORAL | Status: AC | PRN
  Administered 2017-09-29: 25 mg via ORAL
  Filled 2017-09-28: qty 1

## 2017-09-28 MED ORDER — ONDANSETRON 4 MG PO TBDP: 4.0000 mg | ORAL_TABLET | Freq: Four times a day (QID) | ORAL | Status: AC | PRN

## 2017-09-28 MED ORDER — TRAZODONE HCL 50 MG PO TABS
50.0000 mg | ORAL_TABLET | Freq: Every evening | ORAL | Status: DC | PRN
  Administered 2017-09-28 – 2017-10-02 (×5): 50 mg via ORAL
  Filled 2017-09-28 (×2): qty 1
  Filled 2017-09-28: qty 7
  Filled 2017-09-28: qty 1
  Filled 2017-09-28: qty 5

## 2017-09-28 MED ORDER — ENSURE ENLIVE PO LIQD
237.0000 mL | Freq: Two times a day (BID) | ORAL | Status: DC
  Administered 2017-09-28 – 2017-09-30 (×4): 237 mL via ORAL

## 2017-09-28 MED ORDER — VITAMIN B-1 100 MG PO TABS
100.0000 mg | ORAL_TABLET | Freq: Every day | ORAL | Status: DC
  Administered 2017-09-29 – 2017-10-03 (×5): 100 mg via ORAL
  Filled 2017-09-28 (×8): qty 1

## 2017-09-28 MED ORDER — CHLORDIAZEPOXIDE HCL 25 MG PO CAPS
25.0000 mg | ORAL_CAPSULE | Freq: Four times a day (QID) | ORAL | Status: AC
  Administered 2017-09-28 (×2): 25 mg via ORAL
  Filled 2017-09-28 (×2): qty 1

## 2017-09-28 MED ORDER — ACETAMINOPHEN 325 MG PO TABS
650.0000 mg | ORAL_TABLET | Freq: Four times a day (QID) | ORAL | Status: DC | PRN
  Administered 2017-09-30 (×2): 650 mg via ORAL
  Filled 2017-09-28 (×2): qty 2

## 2017-09-28 MED ORDER — CHLORDIAZEPOXIDE HCL 25 MG PO CAPS
25.0000 mg | ORAL_CAPSULE | ORAL | Status: AC
  Administered 2017-09-30 (×2): 25 mg via ORAL
  Filled 2017-09-28 (×2): qty 1

## 2017-09-28 MED ORDER — LOPERAMIDE HCL 2 MG PO CAPS: 2.0000 mg | ORAL_CAPSULE | ORAL | Status: AC | PRN

## 2017-09-28 MED ORDER — ADULT MULTIVITAMIN W/MINERALS CH
1.0000 | ORAL_TABLET | Freq: Every day | ORAL | Status: DC
  Administered 2017-09-28 – 2017-10-03 (×6): 1 via ORAL
  Filled 2017-09-28 (×8): qty 1

## 2017-09-28 MED ORDER — ALUM & MAG HYDROXIDE-SIMETH 200-200-20 MG/5ML PO SUSP
30.0000 mL | ORAL | Status: DC | PRN
  Administered 2017-10-01 – 2017-10-02 (×2): 30 mL via ORAL
  Filled 2017-09-28 (×2): qty 30

## 2017-09-28 MED ORDER — MAGNESIUM HYDROXIDE 400 MG/5ML PO SUSP: 30.0000 mL | Freq: Every day | ORAL | Status: DC | PRN

## 2017-09-28 NOTE — ED Notes (Signed)
Report given to [facility], [facility] called, patient waiting to be picked up.
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
## 2017-09-28 NOTE — ED Notes (Signed)
Copy of voluntary admission and consent signed and faxed to [facility]

## 2017-09-28 NOTE — ED Notes (Signed)
Report called to [facility] 

## 2017-09-28 NOTE — ED Notes (Signed)
Patient reports that he did not sleep last night, he states that he was a "very bad dream, hearing voices, telling me WHY, WHY" and patient's hands tremors as he is tearful. He repeats "why cant I sleep, my drinking is doing this to me" Patient also reports having a headache. Will continue to re-assess

## 2017-09-28 NOTE — ED Notes (Signed)
[facility]  CALLED  FOR  TRANSPORT   

## 2017-09-28 NOTE — Tx Team (Signed)
Initial Treatment Plan 09/28/2017 7:42 PM Andrew Rivas [id number]    PATIENT STRESSORS: Financial difficulties Legal issue Substance abuse   PATIENT STRENGTHS: Capable of independent living Motivation for treatment/growth Physical Health   PATIENT IDENTIFIED PROBLEMS: Patient is homeless  Patient abuses alcohol Patient abuses Oxycontin                   DISCHARGE CRITERIA:  Ability to meet basic life and health needs Improved stabilization in mood, thinking, and/or behavior Safe-care adequate arrangements made  PRELIMINARY DISCHARGE PLAN: Attend aftercare/continuing care group Outpatient therapy  PATIENT/FAMILY INVOLVEMENT: This treatment plan has been presented to and reviewed with the patient, Andrew Rivas.  The patient and family have been given the opportunity to ask questions and make suggestions.  Kirstie Mirza, RN 09/28/2017, 7:42 PM

## 2017-09-28 NOTE — [facility] Counselor (Signed)
Patient accepted to [facility] by Donell Sievert, PA and the attending provider is Dr. Jola Babinski. The room number is 300-1. Nursing report [phone]. Patient is voluntary and pending [facility] transport. Patient's nurse Al Decant) made aware of the disposition.

## 2017-09-29 DIAGNOSIS — Z560 Unemployment, unspecified: Secondary | ICD-10-CM

## 2017-09-29 DIAGNOSIS — Z653 Problems related to other legal circumstances: Secondary | ICD-10-CM

## 2017-09-29 DIAGNOSIS — R4587 Impulsiveness: Secondary | ICD-10-CM

## 2017-09-29 DIAGNOSIS — F1190 Opioid use, unspecified, uncomplicated: Secondary | ICD-10-CM

## 2017-09-29 DIAGNOSIS — F1020 Alcohol dependence, uncomplicated: Secondary | ICD-10-CM

## 2017-09-29 DIAGNOSIS — R454 Irritability and anger: Secondary | ICD-10-CM

## 2017-09-29 DIAGNOSIS — R456 Violent behavior: Secondary | ICD-10-CM

## 2017-09-29 DIAGNOSIS — Z720 Tobacco use: Secondary | ICD-10-CM

## 2017-09-29 DIAGNOSIS — F10129 Alcohol abuse with intoxication, unspecified: Secondary | ICD-10-CM

## 2017-09-29 DIAGNOSIS — T1491XA Suicide attempt, initial encounter: Secondary | ICD-10-CM

## 2017-09-29 DIAGNOSIS — F332 Major depressive disorder, recurrent severe without psychotic features: Principal | ICD-10-CM

## 2017-09-29 DIAGNOSIS — G4700 Insomnia, unspecified: Secondary | ICD-10-CM

## 2017-09-29 DIAGNOSIS — Z590 Homelessness: Secondary | ICD-10-CM

## 2017-09-29 DIAGNOSIS — Y908 Blood alcohol level of 240 mg/100 ml or more: Secondary | ICD-10-CM

## 2017-09-29 DIAGNOSIS — F1490 Cocaine use, unspecified, uncomplicated: Secondary | ICD-10-CM

## 2017-09-29 DIAGNOSIS — F1024 Alcohol dependence with alcohol-induced mood disorder: Secondary | ICD-10-CM

## 2017-09-29 DIAGNOSIS — Z818 Family history of other mental and behavioral disorders: Secondary | ICD-10-CM

## 2017-09-29 DIAGNOSIS — F1590 Other stimulant use, unspecified, uncomplicated: Secondary | ICD-10-CM

## 2017-09-29 DIAGNOSIS — X780XXA Intentional self-harm by sharp glass, initial encounter: Secondary | ICD-10-CM

## 2017-09-29 DIAGNOSIS — F1290 Cannabis use, unspecified, uncomplicated: Secondary | ICD-10-CM

## 2017-09-29 MED ORDER — SERTRALINE HCL 25 MG PO TABS
25.0000 mg | ORAL_TABLET | Freq: Every day | ORAL | Status: DC
  Administered 2017-09-29: 25 mg via ORAL
  Filled 2017-09-29 (×4): qty 1

## 2017-09-29 MED ORDER — SERTRALINE HCL 50 MG PO TABS
50.0000 mg | ORAL_TABLET | Freq: Every day | ORAL | Status: DC
  Administered 2017-09-30 – 2017-10-03 (×4): 50 mg via ORAL
  Filled 2017-09-29 (×3): qty 1
  Filled 2017-09-29: qty 7
  Filled 2017-09-29 (×3): qty 1
  Filled 2017-09-29: qty 7

## 2017-09-29 NOTE — Progress Notes (Signed)
[facility] Group Notes:  (Nursing/MHT/Case Management/Adjunct)  Date:  09/29/2017  Time:  2045 Type of Therapy:  wrap up group  Participation Level:  Active  Participation Quality:  Appropriate, Attentive, Sharing and Supportive  Affect:  Flat  Cognitive:  Appropriate  Insight:  Good  Engagement in Group:  Engaged  Modes of Intervention:  Clarification, Education and Support  Summary of Progress/Problems:  Andrew Rivas 09/29/2017, 9:30 PM

## 2017-09-29 NOTE — [facility] Group Notes (Signed)
[facility] Group Notes:  (Nursing/MHT/Case Management/Adjunct)  Date:  09/29/2017  Time:  9:14 AM  Type of Therapy:  Psychoeducational Skills  Participation Level:  Active  Participation Quality:  Appropriate  Affect:  Appropriate  Cognitive:  Alert  Insight:  Appropriate  Engagement in Group:  Engaged  Modes of Intervention:  Discussion and Education  Summary of Progress/Problems:  Pt attended and participated Orientation/ Psychoeducational group. During this group staff discussed the unit rules and the treatment agreement. Pts then participated in a therapeutic game.    Karren Cobble 09/29/2017, 9:14 AM

## 2017-09-29 NOTE — Progress Notes (Signed)
D: Patient presents anxious, shaky, with tremor in bilateral arms. Patient reports having a mild headache and anxiety and auditory hallucinations of "people talking to me that aren't there." Patient also listed on inventory sheet: tremors, cravings, runny nose, chilling, cramping. Patient scored 6 on CIWA, continuing to administer Librium taper. Patient wants to quit use of drugs and alcohol, and is attending group therapy sessions to help with such.  Sleep was poor last night, and patient received trazodone, but reports it was not effective. Energy level is low, concentration is good, appetite is poor. Patient rates depression 8/10, hopelessness 6/10, anxiety 8/10. Patient denies SI, however.   A: Patient checked q15 min, and checks reviewed. Reviewed medication changes with patient and educated on side effects. Educated patient on importance of attending group therapy sessions and educated on several coping skills. Encouarged participation in milieu through recreation therapy and attending meals with peers. Educated on depression management and treatment. R: Patient receptive to education on medications, and is medication compliant. Patient attending all group therapy, recreation time, and cafeteria with peers. Patient contracts for safety on the unit. Goal: "to pull myself together" and to meet goal: "move around a little more, get a shower."
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
## 2017-09-29 NOTE — Progress Notes (Signed)
D.  Pt pleasant on approach, mild withdrawal symptoms reported.  Pt was positive for evening wrap up group, observed engaged in appropriate interaction with peers on the unit.  Pt denies SI/HI/AVH at this time.  A.  Support and encouragement offered, medication given as ordered  R. Pt remains safe on the unit, will continue to monitor.

## 2017-09-29 NOTE — Progress Notes (Signed)
Pt reports he is feeling better than he felt earlier today.  He has been observed sitting in the dayroom watching TV.  He attended evening AA group.  He reports some mild to moderate withdrawal symptoms still and in compliant with meds.  He denies SI/HI/AVH at this time.  He makes his needs known to staff.  Support and encouragement offered.  Medicated per orders.  Discharge plans are in process.  Safety maintained with q15 minute checks.

## 2017-09-29 NOTE — [facility] Suicide Risk Assessment (Signed)
[facility] INPATIENT:  Family/Significant Other Suicide Prevention Education  Suicide Prevention Education:  Patient Refusal for Family/Significant Other Suicide Prevention Education: The patient KIEN MIRSKY has refused to provide written consent for family/significant other to be provided Family/Significant Other Suicide Prevention Education during admission and/or prior to discharge.  Physician notified.  Suicide Prevention Education was reviewed thoroughly with patient, including risk factors, warning signs, and what to do.  Mobile Crisis services were described and that telephone number pointed out, with encouragement to patient to put this number in personal cell phone.  Brochure was provided to patient to share with loved ones.  Patient acknowledged the ways in which they are at risk, and how working through each of their issues can gradually start to reduce their risk factors.  Patient was encouraged to think of the information in the context of people in their own lives.  Patient denied having access to weapons.  Patient endorsed a desire to live.   Carloyn Jaeger Grossman-Orr 09/29/2017, 2:44 PM
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
## 2017-09-29 NOTE — Plan of Care (Signed)
Patient participating in treatment plan, working on coping skills. Patient demonstrates an improving mood, decrease in SI. Patient still experiencing withdrawal from alcohol and opiates.

## 2017-09-30 DIAGNOSIS — F419 Anxiety disorder, unspecified: Secondary | ICD-10-CM

## 2017-09-30 DIAGNOSIS — F17210 Nicotine dependence, cigarettes, uncomplicated: Secondary | ICD-10-CM

## 2017-09-30 DIAGNOSIS — F322 Major depressive disorder, single episode, severe without psychotic features: Secondary | ICD-10-CM

## 2017-09-30 DIAGNOSIS — M545 Low back pain: Secondary | ICD-10-CM

## 2017-09-30 DIAGNOSIS — R450 Nervousness: Secondary | ICD-10-CM

## 2017-09-30 LAB — TSH: TSH: 2.058 u[IU]/mL (ref 0.350–4.500)

## 2017-09-30 LAB — CK: Total CK: 81 U/L (ref 49–397)

## 2017-09-30 MED ORDER — LIDOCAINE 5 % EX PTCH
1.0000 | MEDICATED_PATCH | Freq: Every day | CUTANEOUS | Status: DC
  Administered 2017-09-30 – 2017-10-02 (×3): 1 via TRANSDERMAL
  Filled 2017-09-30 (×2): qty 1
  Filled 2017-09-30 (×2): qty 7
  Filled 2017-09-30: qty 1
  Filled 2017-09-30: qty 7
  Filled 2017-09-30 (×2): qty 1

## 2017-09-30 NOTE — Progress Notes (Signed)
Pt did not attend goals group today. During goals group, pt was sleep.
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
## 2017-09-30 NOTE — Progress Notes (Signed)
D.  Pt pleasant on approach, no complaints voiced at this time.  Pt was positive for evening AA group, observed engaged in appropriate interaction with peers on the unit.  Pt states "it will be probably be another couple of weeks until I get right, but then no more drinking for me, I'm ready to quit".  A.  Support and encouragement offered, medication given as ordered R.  Pt remains safe on the unit, will continue to monitor.

## 2017-09-30 NOTE — Progress Notes (Deleted)
[email or web address]   D Pt is observed OOB UAL on the 300 hall today..he tolerates this well... He makes direct eye  contat and says

## 2017-09-30 NOTE — [facility] Group Notes (Signed)
[facility] LCSW Group Therapy Note  09/30/2017  9:00-10:00AM  Type of Therapy and Topic:  Group Therapy:  Acknowledging and Resolving Issues with Mothers  Participation Level:  Active   Description of Group:   Patients in this group were asked to briefly describe their experience with the mother figure(s) in their lives, both in childhood and adulthood.  Different types of support provided by these individuals were identified.   Patients were then encouraged to determine whether their mother figure was or is a healthy or unhealthy support.  The manner in which that early relationship has shaped patient's feelings and life decisions was pointed out and acknowledged.  Group members gave support to each other.  CSW led a discussion on how helpful it can be to resolve past issues, and how this can be done whether the mother figure is now alive or already deceased.  An emphasis was placed on continuing to work with a therapist on these issues  when patients leave the hospital in order to be able to focus on the future instead of the past, to continue becoming healthier and happier.   Therapeutic Goals: 1)  discuss the possibility of mother figure(s) being positive and/or negative in one's life, normalizing that some people never had positive experiences with "maternal" persons  2)  describe patient's specific example of mother figure(s), allowing time to vent  3)  identify the patient's current need for resolution in the relationship with the aforementioned person  4)  elicit commitments to work on resolving feelings about mother figure(s) in order to move forward in life and wellness   Summary of Patient Progress:  The patient expressed full comprehension of the concepts presented, and talked at length about his relationship with his mother who is now deceased.  He said repeatedly how close they were although he wished they had been closer, and that he had been able to tell her more.  Looking back he can see  that she suffered from significant depression, and recalls once when she attempted suicide in front of him by cutting her wrist with a can lid.  He stated he has to accept that she is gone, but days like today are hard and he can't "focus on it too much."  He was encouraged to write to his mother or talk to her and not keep his feelings inside.   Therapeutic Modalities:   Processing Brief Solution-Focused Therapy  Lynnell Chad

## 2017-09-30 NOTE — Progress Notes (Signed)
Nutrition Brief Note  Patient identified on the Malnutrition Screening Tool (MST) Report  No weight loss recorded. Pt was eating 100% of meals in the ED. Ensure supplements have been ordered, will continue.  Wt Readings from Last 15 Encounters:  09/28/17 140 lb (63.5 kg)  09/26/17 140 lb (63.5 kg)  09/23/17 140 lb (63.5 kg)    Body mass index is 20.67 kg/m. Patient meets criteria for normal based on current BMI.   Labs and medications reviewed.   No nutrition interventions warranted at this time. If nutrition issues arise, please consult RD.   Tilda Franco, MS, RD, LDN [facility] Inpatient Clinical Dietitian Pager: [phone] After Hours Pager: [phone]

## 2017-10-01 DIAGNOSIS — F1020 Alcohol dependence, uncomplicated: Secondary | ICD-10-CM

## 2017-10-01 DIAGNOSIS — F339 Major depressive disorder, recurrent, unspecified: Secondary | ICD-10-CM | POA: Diagnosis present

## 2017-10-01 MED ORDER — HYDROXYZINE HCL 25 MG PO TABS
25.0000 mg | ORAL_TABLET | Freq: Three times a day (TID) | ORAL | Status: DC | PRN
  Administered 2017-10-01 – 2017-10-02 (×2): 25 mg via ORAL
  Filled 2017-10-01: qty 5
  Filled 2017-10-01: qty 10

## 2017-10-01 MED ORDER — GABAPENTIN 300 MG PO CAPS
300.0000 mg | ORAL_CAPSULE | Freq: Three times a day (TID) | ORAL | Status: DC
Start: 2017-10-01 — End: 2017-10-03
  Administered 2017-10-01 – 2017-10-03 (×6): 300 mg via ORAL
  Filled 2017-10-01 (×2): qty 1
  Filled 2017-10-01 (×2): qty 21
  Filled 2017-10-01 (×6): qty 1
  Filled 2017-10-01 (×2): qty 21
  Filled 2017-10-01: qty 1
  Filled 2017-10-01: qty 21
  Filled 2017-10-01: qty 1
  Filled 2017-10-01: qty 21

## 2017-10-01 MED ORDER — IBUPROFEN 400 MG PO TABS
400.0000 mg | ORAL_TABLET | Freq: Four times a day (QID) | ORAL | Status: DC | PRN
Start: 2017-10-01 — End: 2017-10-03
  Administered 2017-10-01 – 2017-10-03 (×6): 400 mg via ORAL
  Filled 2017-10-01 (×6): qty 1
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
## 2017-10-01 NOTE — Progress Notes (Signed)
Adult Psychoeducational Group Note  Date:  10/01/2017 Time:  8:55 PM  Group Topic/Focus:  Wrap-Up Group:   The focus of this group is to help patients review their daily goal of treatment and discuss progress on daily workbooks.  Participation Level:  Active  Participation Quality:  Appropriate  Affect:  Appropriate  Cognitive:  Alert  Insight: Appropriate  Engagement in Group:  Engaged  Modes of Intervention:  Discussion  Additional Comments:  Patient stated having a good day. Patient stated he socialized with his peers. Patient's goal for today was to "get body back right, and to get on the right medications".  Andrew Rivas L Andrew Rivas 10/01/2017, 8:55 PM

## 2017-10-01 NOTE — Progress Notes (Signed)
Pt attended orientation and goals group. Pt also participated and said his goal for the day is to get through his withdrawal. He also wants to get over his suicidal thoughts.

## 2017-10-01 NOTE — Progress Notes (Signed)
Patient ID: Andrew Rivas, male   DOB: 1960-08-20, 57 y.o.   MRN: [id number]  Nursing Progress Note [id number]  Data: Patient presents with pleasant mood and is mildly anxious. Patient does endorse some withdrawal symptoms including body aches, headache and restlessness. Patient complaint with scheduled medications. Patient denies SI/HI/AVH or pain. Patient contracts for safety on the unit at this time. Patient completed self-inventory sheet and rates depression, hopelessness, and anxiety 8,8,7 respectively. Patient rates their sleep and appetite as fair/fair respectively. Patient states goal for today is to "get plenty of rest". Patient is visible in the milieu and cooperative with staff.  Action: Patient educated about and provided medication per provider's orders. Patient safety maintained with q15 min safety checks. Low fall risk precautions in place. Emotional support given. 1:1 interaction and active listening provided. Patient encouraged to attend meals and groups. Labs, vital signs and patient behavior monitored throughout shift. Patient encouraged to work on treatment plan and goals.  Response: Patient remains safe on the unit at this time. Patient is interacting with peers appropriately on the unit. Will continue to support and monitor.

## 2017-10-01 NOTE — [facility] Group Notes (Signed)
LCSW Group Therapy Note   10/01/2017 1:15pm   Type of Therapy and Topic:  Group Therapy:  Overcoming Obstacles   Participation Level:  Active   Description of Group:    In this group patients will be encouraged to explore what they see as obstacles to their own wellness and recovery. They will be guided to discuss their thoughts, feelings, and behaviors related to these obstacles. The group will process together ways to cope with barriers, with attention given to specific choices patients can make. Each patient will be challenged to identify changes they are motivated to make in order to overcome their obstacles. This group will be process-oriented, with patients participating in exploration of their own experiences as well as giving and receiving support and challenge from other group members.   Therapeutic Goals: 1. Patient will identify personal and current obstacles as they relate to admission. 2. Patient will identify barriers that currently interfere with their wellness or overcoming obstacles.  3. Patient will identify feelings, thought process and behaviors related to these barriers. 4. Patient will identify two changes they are willing to make to overcome these obstacles:      Summary of Patient Progress   Andrew Rivas was attentive and engaged during today's processing group. He shared that his biggest obstacle involves having to go to court and then to jail for not being able to pay fees associated with his probation. Andrew Rivas states that he has been struggling with depression and substance abuse for many years. Andrew Rivas continues to show progress in the group setting with improving insight.    Therapeutic Modalities:   Cognitive Behavioral Therapy Solution Focused Therapy Motivational Interviewing Relapse Prevention Therapy  Ledell Peoples Smart, LCSW 10/01/2017 10:31 AM

## 2017-10-01 NOTE — Progress Notes (Signed)
Late entry for 09/30/17    D Pt has been observed OOB UAL on the 300 hall today tolerated well. He is seen socializing appropriately with his peers. He is cooperative, endorses a flat, depressed affect. He takes his scheduled meds as planned.     A> He completed his daily assessment and on this he wrote he denies SI today and he rated his feelings of depression, hopelessness and anxiety " 8/8/7", respectively.     R Currently he does not have final dc plans. Safety is in  Place.

## 2017-10-01 NOTE — Progress Notes (Signed)
Nursing Progress Note: 7p-7a D: Pt currently presents with a sad/depressed affect and behavior. Pt states "I was just feeling really depressed earlier today. I just am glad I can stay here another day. I feel like I'm still detoxing. I don't wanna leave before it's time." Interacting appropriately with the milieu. Pt reports good sleep during the previous night with current medication regimen. Pt did attend wrap-up group.  A: Pt provided with medications per providers orders. Pt's labs and vitals were monitored throughout the night. Pt supported emotionally and encouraged to express concerns and questions. Pt educated on medications.  R: Pt's safety ensured with 15 minute and environmental checks. Pt currently denies SI, HI, and AVH. Pt verbally contracts to seek staff if SI,HI, or AVH occurs and to consult with staff before acting on any harmful thoughts. Will continue to monitor.

## 2017-10-01 NOTE — Tx Team (Signed)
Interdisciplinary Treatment and Diagnostic Plan Update  10/01/2017 Time of Session: 0830AM Andrew Rivas MRN: [id number]  Principal Diagnosis: MDD, recurrent, severe  Secondary Diagnoses: Active Problems:   MDD (major depressive disorder), severe (HCC)   Alcohol dependence with alcohol-induced mood disorder (HCC)   Current Medications:  Current Facility-Administered Medications  Medication Dose Route Frequency Provider Last Rate Last Dose  . acetaminophen (TYLENOL) tablet 650 mg  650 mg Oral Q6H PRN Money, Gerlene Burdock, FNP   650 mg at 09/30/17 2213  . alum & mag hydroxide-simeth (MAALOX/MYLANTA) 200-200-20 MG/5ML suspension 30 mL  30 mL Oral Q4H PRN Money, Gerlene Burdock, FNP      . chlordiazePOXIDE (LIBRIUM) capsule 25 mg  25 mg Oral Q6H PRN Money, Gerlene Burdock, FNP   25 mg at 09/29/17 2101  . feeding supplement (ENSURE ENLIVE) (ENSURE ENLIVE) liquid 237 mL  237 mL Oral BID BM Money, Travis B, FNP   237 mL at 09/30/17 1037  . hydrOXYzine (ATARAX/VISTARIL) tablet 25 mg  25 mg Oral Q6H PRN Money, Gerlene Burdock, FNP   25 mg at 09/30/17 2212  . lidocaine (LIDODERM) 5 % 1 patch  1 patch Transdermal Daily Oneta Rack, NP   1 patch at 10/01/17 0759  . loperamide (IMODIUM) capsule 2-4 mg  2-4 mg Oral PRN Money, Gerlene Burdock, FNP      . magnesium hydroxide (MILK OF MAGNESIA) suspension 30 mL  30 mL Oral Daily PRN Money, Gerlene Burdock, FNP      . multivitamin with minerals tablet 1 tablet  1 tablet Oral Daily Money, Gerlene Burdock, FNP   1 tablet at 10/01/17 0758  . ondansetron (ZOFRAN-ODT) disintegrating tablet 4 mg  4 mg Oral Q6H PRN Money, Gerlene Burdock, FNP      . sertraline (ZOLOFT) tablet 50 mg  50 mg Oral Daily Cobos, Rockey Situ, MD   50 mg at 10/01/17 0757  . thiamine (VITAMIN B-1) tablet 100 mg  100 mg Oral Daily Money, Gerlene Burdock, FNP   100 mg at 10/01/17 0758  . traZODone (DESYREL) tablet 50 mg  50 mg Oral QHS PRN Money, Gerlene Burdock, FNP   50 mg at 09/30/17 2212   PTA Medications: Medications Prior to Admission   Medication Sig Dispense Refill Last Dose  . Aspirin-Acetaminophen-Caffeine (GOODY HEADACHE PO) Take 1 Package by mouth as needed (pain).   Past Week at Unknown time  . oxyCODONE HCl (OXYCONTIN PO) Take by mouth.   09/26/2017 at Unknown time    Patient Stressors: Financial difficulties Legal issue Substance abuse  Patient Strengths: Capable of independent living Motivation for treatment/growth Physical Health  Treatment Modalities: Medication Management, Group therapy, Case management,  1 to 1 session with clinician, Psychoeducation, Recreational therapy.   Physician Treatment Plan for Primary Diagnosis: MDD, recurrent, severe Long Term Goal(s): Improvement in symptoms so as ready for discharge Improvement in symptoms so as ready for discharge   Short Term Goals: Ability to identify changes in lifestyle to reduce recurrence of condition will improve Ability to disclose and discuss suicidal ideas Ability to identify and develop effective coping behaviors will improve Compliance with prescribed medications will improve Ability to demonstrate self-control will improve Ability to identify and develop effective coping behaviors will improve Ability to maintain clinical measurements within normal limits will improve Compliance with prescribed medications will improve Ability to identify triggers associated with substance abuse/mental health issues will improve  Medication Management: Evaluate patient's response, side effects, and tolerance of medication regimen.  Therapeutic Interventions: 1  to 1 sessions, Unit Group sessions and Medication administration.  Evaluation of Outcomes: Progressing  Physician Treatment Plan for Secondary Diagnosis: Active Problems:   MDD (major depressive disorder), severe (HCC)   Alcohol dependence with alcohol-induced mood disorder (HCC)  Long Term Goal(s): Improvement in symptoms so as ready for discharge Improvement in symptoms so as ready for  discharge   Short Term Goals: Ability to identify changes in lifestyle to reduce recurrence of condition will improve Ability to disclose and discuss suicidal ideas Ability to identify and develop effective coping behaviors will improve Compliance with prescribed medications will improve Ability to demonstrate self-control will improve Ability to identify and develop effective coping behaviors will improve Ability to maintain clinical measurements within normal limits will improve Compliance with prescribed medications will improve Ability to identify triggers associated with substance abuse/mental health issues will improve     Medication Management: Evaluate patient's response, side effects, and tolerance of medication regimen.  Therapeutic Interventions: 1 to 1 sessions, Unit Group sessions and Medication administration.  Evaluation of Outcomes: Progressing   RN Treatment Plan for Primary Diagnosis: MDD, recurrent, severe Long Term Goal(s): Knowledge of disease and therapeutic regimen to maintain health will improve  Short Term Goals: Ability to remain free from injury will improve, Ability to disclose and discuss suicidal ideas and Ability to identify and develop effective coping behaviors will improve  Medication Management: RN will administer medications as ordered by provider, will assess and evaluate patient's response and provide education to patient for prescribed medication. RN will report any adverse and/or side effects to prescribing provider.  Therapeutic Interventions: 1 on 1 counseling sessions, Psychoeducation, Medication administration, Evaluate responses to treatment, Monitor vital signs and CBGs as ordered, Perform/monitor CIWA, COWS, AIMS and Fall Risk screenings as ordered, Perform wound care treatments as ordered.  Evaluation of Outcomes: Progressing   LCSW Treatment Plan for Primary Diagnosis: MDD, recurrent, severe Long Term Goal(s): Safe transition to  appropriate next level of care at discharge, Engage patient in therapeutic group addressing interpersonal concerns.  Short Term Goals: Engage patient in aftercare planning with referrals and resources, Facilitate patient progression through stages of change regarding substance use diagnoses and concerns and Identify triggers associated with mental health/substance abuse issues  Therapeutic Interventions: Assess for all discharge needs, 1 to 1 time with Social worker, Explore available resources and support systems, Assess for adequacy in community support network, Educate family and significant other(s) on suicide prevention, Complete Psychosocial Assessment, Interpersonal group therapy.  Evaluation of Outcomes: Progressing   Progress in Treatment: Attending groups: Yes. Participating in groups: Yes. Taking medication as prescribed: Yes. Toleration medication: Yes. Family/Significant other contact made: SPE completed with pt; pt declined to consent to family contact.  Patient understands diagnosis: Yes. Discussing patient identified problems/goals with staff: Yes. Medical problems stabilized or resolved: Yes. Denies suicidal/homicidal ideation: Yes. Issues/concerns per patient self-inventory: Yes. Other: n/a   New problem(s) identified: None   New Short Term/Long Term Goal(s): detox, medication management for mood stabilization; elimination of SI thoughts; development of comprehensive mental wellness/sobriety plan.   Patient Goal: "to stop using drus and get on medication for my mood."   Discharge Plan or Barriers: CSW assessing for appropriate referrals. Pt states he has court date on 5/16 and will be going to jail for six months. ADS for outpatient care. MHAG pamphlet and AA/NA information also provided for additional community support.   Reason for Continuation of Hospitalization: Anxiety Depression Medication stabilization Suicidal ideation Withdrawal symptoms  Estimated  Length of  Stay: Tuesday, 10/02/17  Attendees: Patient: Andrew Rivas 10/01/2017 8:34 AM  Physician: Dr. Jola Babinski MD; Dr. Altamese Media MD 10/01/2017 8:34 AM  Nursing: Marchelle Folks RN 10/01/2017 8:34 AM  RN Care Manager:x 10/01/2017 8:34 AM  Social Worker: Trula Slade, LCSW 10/01/2017 8:34 AM  Recreational Therapist: x 10/01/2017 8:34 AM  Other: Armandina Stammer NP; Nanine Means NP 10/01/2017 8:34 AM  Other:  10/01/2017 8:34 AM  Other: 10/01/2017 8:34 AM    Scribe for Treatment Team: Ledell Peoples Smart, LCSW 10/01/2017 8:34 AM

## 2017-10-01 NOTE — Progress Notes (Signed)
Patient did attend the evening speaker AA meeting.  

## 2017-10-02 NOTE — Progress Notes (Signed)
Recreation Therapy Notes  Animal-Assisted Activity (AAA) Program Checklist/Progress Notes Patient Eligibility Criteria Checklist & Daily Group note for Rec Tx Intervention  Date: 5.14.19 Time: 1430 Location: [address]   AAA/T Program Assumption of Risk Form signed by Engineer, production or Parent Legal Guardian YES   Patient is free of allergies or sever asthma NO  Patient reports no fear of animals YES   Patient reports no history of cruelty to animals YES   Patient understands his/her participation is voluntary YES   Patient washes hands before animal contact YES   Patient washes hands after animal contact YES   Education: Charity fundraiser, Appropriate Animal Interaction   Education Outcome: Acknowledges understanding/In group clarification offered/Needs additional education.   Clinical Observations/Feedback: Pt did not attend group.    Caroll Rancher, LRT/CTRS     Caroll Rancher A 10/02/2017 3:50 PM

## 2017-10-02 NOTE — Progress Notes (Signed)
Patient ID: Andrew Rivas, male   DOB: [birth year], 57 y.o.   MRN: [id number]  Nursing Progress Note [id number]  Data: Patient presents with depressed mood but is pleasant and cooperative. Patient complaint with scheduled medications. Patient denies SI/HI/AVH. Patient contracts for safety on the unit at this time. Patient completed self-inventory sheet and rates depression, hopelessness, and anxiety 6,6,8 respectively. Patient rates their sleep and appetite as fair/good respectively. Patient states goal for today is to "rest" and "get myself together". Patient blood pressure initially low this morning. Patient ate breakfast and drank 2 cups of Gatorade. BP increased after interventions. Patient does endorse a mild headache and gas pains and is provided PRN medications as ordered.  Action: Patient educated about and provided medication per provider's orders. Patient safety maintained with q15 min safety checks. Low fall risk precautions in place. Emotional support given. 1:1 interaction and active listening provided. Patient encouraged to attend meals and groups. Labs, vital signs and patient behavior monitored throughout shift. Patient encouraged to work on treatment plan and goals.  Response: Patient remains safe on the unit at this time. Patient is interacting with peers appropriately on the unit. Will continue to support and monitor.

## 2017-10-02 NOTE — B[facility] Group Notes (Signed)
[facility] Mental [facility] Association Group Therapy 10/02/2017 1:15pm  Type of Therapy: Mental Health Association Presentation  Participation Level: Active  Participation Quality: Attentive  Affect: Appropriate  Cognitive: Oriented  Insight: Developing/Improving  Engagement in Therapy: Engaged  Modes of Intervention: Discussion, Education and Socialization  Summary of Progress/Problems: CSW provided pt and group with MHAG information and other community resources The pt processed ways by which to utilize these services. CSW provided handouts and educational information pertaining to groups and services offered by the [facility]. Pt was engaged in presentation and was receptive to resources provided.   [employer], LCSW 10/02/2017 11:24 AM

## 2017-10-02 NOTE — Progress Notes (Signed)
Nursing Progress Note: 7p-7a D: Pt currently presents with a depressed/pleasant on approach affect and behavior. Pt states "I feel the affects of the meds. My mood has improved so much." Interacting appropriately with the milieu. Pt reports good sleep during the previous night with current medication regimen. Pt did attend wrap-up group.  A: Pt provided with medications per providers orders. Pt's labs and vitals were monitored throughout the night. Pt supported emotionally and encouraged to express concerns and questions. Pt educated on medications.  R: Pt's safety ensured with 15 minute and environmental checks. Pt currently denies SI, HI, and AVH. Pt verbally contracts to seek staff if SI,HI, or AVH occurs and to consult with staff before acting on any harmful thoughts. Will continue to monitor.
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
## 2017-10-02 NOTE — B[facility] Group Notes (Signed)
Adult Psychoeducational Group Note  Date:  10/02/2017 Time:  1600  Group Topic/Focus: Mindfulness, Support Systems, Reocvery Recovery Goals:   The focus of this group is to identify appropriate goals for recovery and establish a plan to achieve them.  Participation Level:  Minimal  Participation Quality:  Appropriate  Affect:  Depressed  Cognitive:  Alert and Oriented  Insight: Improving  Engagement in Group:  Engaged  Modes of Intervention:  Discussion, Education, Socialization and Support  Additional Comments:  Patient attended group and participated. Patient discussed that recovery to him meant making the right choices.  Marchelle Folks A Giavanni Odonovan 10/02/2017, 6:00 PM

## 2017-10-02 NOTE — Progress Notes (Signed)
Patient did attend the evening speaker AA meeting.  

## 2017-10-03 DIAGNOSIS — F339 Major depressive disorder, recurrent, unspecified: Secondary | ICD-10-CM

## 2017-10-03 DIAGNOSIS — F1010 Alcohol abuse, uncomplicated: Secondary | ICD-10-CM

## 2017-10-03 MED ORDER — SERTRALINE HCL 50 MG PO TABS: 50.0000 mg | ORAL_TABLET | Freq: Every day | ORAL | 0 refills | Status: DC

## 2017-10-03 MED ORDER — HYDROXYZINE HCL 25 MG PO TABS: 25.0000 mg | ORAL_TABLET | Freq: Three times a day (TID) | ORAL | 0 refills | Status: DC | PRN

## 2017-10-03 MED ORDER — GABAPENTIN 300 MG PO CAPS: 300.0000 mg | ORAL_CAPSULE | Freq: Three times a day (TID) | ORAL | 0 refills | Status: DC

## 2017-10-03 MED ORDER — TRAZODONE HCL 50 MG PO TABS: 50.0000 mg | ORAL_TABLET | Freq: Every evening | ORAL | 0 refills | Status: DC | PRN

## 2017-10-03 NOTE — Progress Notes (Signed)
D:  Patient's self inventory sheet, patient has fair sleep, sleep medication helpful.  Fair appetite, normal energy level, good concentration.  Rated depression 8, hopeless 7, anxiety 6.  Withdrawals, tremors, cravings, runny nose, irritability.  Denied SSI.  Physical problems, dizziness, headaches.  Physical pain, headaches, pain medication helpful.  Goal is "get over headaches and withdrawals.  Help me get my right meds to rid of this.  I need more time to make sure I am suitable to leave, working on that."   A:  Medications administered per MD orders.  Emotional support and encouragement given patient. R:  Patient denied SI and HI, contracts for safety.  Denied A/V hallucinations.  Safety maintained with1 5 minute checks.

## 2017-10-03 NOTE — Progress Notes (Signed)
Recreation Therapy Notes  Date: 5.15.19 Time: 0930 Location: 300 [address] Dayroom  Group Topic: Stress Management  Goal Area(s) Addresses:  Patient will verbalize importance of using healthy stress management.  Patient will identify positive emotions associated with healthy stress management.   Behavioral Response: Engaged  Intervention: Stress Management  Activity :  Meditation.  LRT played a meditation on being resilient in the face of adversity.  Patients were to follow along as the meditation played.   Education:  Stress Management, Discharge Planning.   Education Outcome: Acknowledges edcuation/In group clarification offered/Needs additional education  Clinical Observations/Feedback: Pt attended and participated in group.    Caroll Rancher, LRT/CTRS     Lillia Abed, Dequan [facility] A 10/03/2017 10:55 AM

## 2017-10-03 NOTE — Progress Notes (Signed)
  [facility] Adult Case Management Discharge Plan :  Will you be returning to the same living situation after discharge:  No.Pt is going to jail tomorrow to serve 6 mo sentence and will be going to shelter this evening At discharge, do you have transportation home?: Yes,  bus pass in chart Do you have the ability to pay for your medications: Yes,  mental health  Release of information consent forms completed and submitted to medical records by CSW.  Patient to Follow up at: Follow-up Information    [facility] Follow up.   Why:  Please walk in within 3 days of discharge to be assessed for outpatient mental health services including: medication management and therapy/Substance Abuse Intensive Outpatient Program. Walk in times: Monday-Friday 11am-12:30pm. Thank you.  Contact information: [address] [place], [place] [address] Phone: [phone] Fax: [phone]          Next level of care provider has access to [facility] Link:no  Safety Planning and Suicide Prevention discussed: Yes,  SPE completed with pt; pt declined to consent to collateral contact. SPI pamphlet and Mobile Crisis information provided to pt.   Have you used any form of tobacco in the last 30 days? (Cigarettes, Smokeless Tobacco, Cigars, and/or Pipes): No  Has patient been referred to the Quitline?: N/A patient is not a smoker  Patient has been referred for addiction treatment: Yes  [employer], LCSW 10/03/2017, 9:26 AM

## 2017-10-03 NOTE — [facility] Group Notes (Signed)
Adult Psychoeducational Group Note  Date:  10/03/2017 Time:  9:26 AM  Group Topic/Focus:  Goals Group:   The focus of this group is to help patients establish daily goals to achieve during treatment and discuss how the patient can incorporate goal setting into their daily lives to aide in recovery.  Participation Level:  Active  Participation Quality:  Appropriate  Affect:  Appropriate  Cognitive:  Alert  Insight: Appropriate  Engagement in Group:  Engaged  Modes of Intervention:  Orientation  Additional Comments:  Pt attended and participated in orientation/goals group. Pt goal for today is to talk with doctor about leaving tomorrow instead of today.   Dellia Nims 10/03/2017, 9:26 AM

## 2017-10-03 NOTE — Progress Notes (Signed)
Discharge Note:  Patient discharged with bus tickets.  Patient denied SI and HI.  Denied A/V hallucinations.  Suicide prevention information given and discussed with patient who stated he understood and had no questions.  Patient stated he received all his belongings, clothing, toiletries, misc items, prescriptions, etc.  Patient stated he appreciated all assistance received from [facility] staff.  All required discharge information given to patient at discharge.
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
## 2017-10-04 ENCOUNTER — Other Ambulatory Visit: Payer: Self-pay

## 2017-10-04 ENCOUNTER — Encounter (HOSPITAL_COMMUNITY): Payer: Self-pay | Admitting: Emergency Medicine

## 2017-10-04 ENCOUNTER — Inpatient Hospital Stay (HOSPITAL_COMMUNITY)
Admission: EM | Admit: 2017-10-04 | Discharge: 2017-10-05 | DRG: 918 | Disposition: A | Discharge status: Home / Self Care | Payer: Self-pay | Attending: Internal Medicine | Admitting: Internal Medicine

## 2017-10-04 ENCOUNTER — Inpatient Hospital Stay (HOSPITAL_COMMUNITY): Payer: Self-pay

## 2017-10-04 DIAGNOSIS — T43214A Poisoning by selective serotonin and norepinephrine reuptake inhibitors, undetermined, initial encounter: Principal | ICD-10-CM | POA: Diagnosis present

## 2017-10-04 DIAGNOSIS — T43594A Poisoning by other antipsychotics and neuroleptics, undetermined, initial encounter: Secondary | ICD-10-CM | POA: Diagnosis present

## 2017-10-04 DIAGNOSIS — F1210 Cannabis abuse, uncomplicated: Secondary | ICD-10-CM | POA: Diagnosis present

## 2017-10-04 DIAGNOSIS — F1490 Cocaine use, unspecified, uncomplicated: Secondary | ICD-10-CM | POA: Diagnosis present

## 2017-10-04 DIAGNOSIS — Z818 Family history of other mental and behavioral disorders: Secondary | ICD-10-CM

## 2017-10-04 DIAGNOSIS — T43224A Poisoning by selective serotonin reuptake inhibitors, undetermined, initial encounter: Secondary | ICD-10-CM | POA: Diagnosis present

## 2017-10-04 DIAGNOSIS — Z653 Problems related to other legal circumstances: Secondary | ICD-10-CM

## 2017-10-04 DIAGNOSIS — R51 Headache: Secondary | ICD-10-CM | POA: Diagnosis present

## 2017-10-04 DIAGNOSIS — F332 Major depressive disorder, recurrent severe without psychotic features: Secondary | ICD-10-CM

## 2017-10-04 DIAGNOSIS — R7401 Elevation of levels of liver transaminase levels: Secondary | ICD-10-CM | POA: Diagnosis present

## 2017-10-04 DIAGNOSIS — T43592A Poisoning by other antipsychotics and neuroleptics, intentional self-harm, initial encounter: Secondary | ICD-10-CM

## 2017-10-04 DIAGNOSIS — R45851 Suicidal ideations: Secondary | ICD-10-CM | POA: Diagnosis present

## 2017-10-04 DIAGNOSIS — F1290 Cannabis use, unspecified, uncomplicated: Secondary | ICD-10-CM

## 2017-10-04 DIAGNOSIS — R4587 Impulsiveness: Secondary | ICD-10-CM

## 2017-10-04 DIAGNOSIS — R110 Nausea: Secondary | ICD-10-CM | POA: Diagnosis present

## 2017-10-04 DIAGNOSIS — T50902A Poisoning by unspecified drugs, medicaments and biological substances, intentional self-harm, initial encounter: Secondary | ICD-10-CM

## 2017-10-04 DIAGNOSIS — R740 Nonspecific elevation of levels of transaminase and lactic acid dehydrogenase [LDH]: Secondary | ICD-10-CM | POA: Diagnosis present

## 2017-10-04 DIAGNOSIS — T43212A Poisoning by selective serotonin and norepinephrine reuptake inhibitors, intentional self-harm, initial encounter: Secondary | ICD-10-CM

## 2017-10-04 DIAGNOSIS — F17210 Nicotine dependence, cigarettes, uncomplicated: Secondary | ICD-10-CM

## 2017-10-04 DIAGNOSIS — T50904A Poisoning by unspecified drugs, medicaments and biological substances, undetermined, initial encounter: Secondary | ICD-10-CM | POA: Diagnosis present

## 2017-10-04 DIAGNOSIS — T391X1A Poisoning by 4-Aminophenol derivatives, accidental (unintentional), initial encounter: Secondary | ICD-10-CM | POA: Diagnosis present

## 2017-10-04 DIAGNOSIS — Y929 Unspecified place or not applicable: Secondary | ICD-10-CM

## 2017-10-04 DIAGNOSIS — R7989 Other specified abnormal findings of blood chemistry: Secondary | ICD-10-CM

## 2017-10-04 DIAGNOSIS — Y903 Blood alcohol level of 60-79 mg/100 ml: Secondary | ICD-10-CM | POA: Diagnosis present

## 2017-10-04 DIAGNOSIS — Z560 Unemployment, unspecified: Secondary | ICD-10-CM

## 2017-10-04 DIAGNOSIS — F1190 Opioid use, unspecified, uncomplicated: Secondary | ICD-10-CM

## 2017-10-04 DIAGNOSIS — F1590 Other stimulant use, unspecified, uncomplicated: Secondary | ICD-10-CM

## 2017-10-04 DIAGNOSIS — F1010 Alcohol abuse, uncomplicated: Secondary | ICD-10-CM | POA: Diagnosis present

## 2017-10-04 DIAGNOSIS — R109 Unspecified abdominal pain: Secondary | ICD-10-CM

## 2017-10-04 DIAGNOSIS — T43222A Poisoning by selective serotonin reuptake inhibitors, intentional self-harm, initial encounter: Secondary | ICD-10-CM

## 2017-10-04 DIAGNOSIS — R945 Abnormal results of liver function studies: Secondary | ICD-10-CM

## 2017-10-04 DIAGNOSIS — Z79899 Other long term (current) drug therapy: Secondary | ICD-10-CM

## 2017-10-04 DIAGNOSIS — T50901A Poisoning by unspecified drugs, medicaments and biological substances, accidental (unintentional), initial encounter: Secondary | ICD-10-CM | POA: Diagnosis present

## 2017-10-04 DIAGNOSIS — R197 Diarrhea, unspecified: Secondary | ICD-10-CM

## 2017-10-04 DIAGNOSIS — T1491XA Suicide attempt, initial encounter: Secondary | ICD-10-CM | POA: Diagnosis present

## 2017-10-04 DIAGNOSIS — Z87891 Personal history of nicotine dependence: Secondary | ICD-10-CM

## 2017-10-04 LAB — RAPID URINE DRUG SCREEN, HOSP PERFORMED
AMPHETAMINES: NOT DETECTED
BENZODIAZEPINES: POSITIVE — AB
Barbiturates: NOT DETECTED
COCAINE: NOT DETECTED
Opiates: NOT DETECTED
TETRAHYDROCANNABINOL: NOT DETECTED

## 2017-10-04 LAB — COMPREHENSIVE METABOLIC PANEL
ALBUMIN: 3.5 g/dL (ref 3.5–5.0)
ALK PHOS: 81 U/L (ref 38–126)
ALT: 231 U/L — AB (ref 17–63)
ALT: 201 U/L — AB (ref 17–63)
AST: 182 U/L — AB (ref 15–41)
AST: 126 U/L — ABNORMAL HIGH (ref 15–41)
Albumin: 3.2 g/dL — ABNORMAL LOW (ref 3.5–5.0)
Alkaline Phosphatase: 94 U/L (ref 38–126)
Anion gap: 12 (ref 5–15)
Anion gap: 10 (ref 5–15)
BILIRUBIN TOTAL: 0.2 mg/dL — AB (ref 0.3–1.2)
BUN: 10 mg/dL (ref 6–20)
BUN: 10 mg/dL (ref 6–20)
CALCIUM: 8 mg/dL — AB (ref 8.9–10.3)
CHLORIDE: 102 mmol/L (ref 101–111)
CO2: 25 mmol/L (ref 22–32)
CO2: 24 mmol/L (ref 22–32)
CREATININE: 0.58 mg/dL — AB (ref 0.61–1.24)
Calcium: 8.6 mg/dL — ABNORMAL LOW (ref 8.9–10.3)
Chloride: 102 mmol/L (ref 101–111)
Creatinine, Ser: 0.75 mg/dL (ref 0.61–1.24)
GFR calc Af Amer: >60 mL/min (ref >60)
GLUCOSE: 92 mg/dL (ref 65–99)
Glucose, Bld: 101 mg/dL — ABNORMAL HIGH (ref 65–99)
Potassium: 4.1 mmol/L (ref 3.5–5.1)
Potassium: 4.3 mmol/L (ref 3.5–5.1)
Sodium: 139 mmol/L (ref 135–145)
Sodium: 136 mmol/L (ref 135–145)
TOTAL PROTEIN: 6.2 g/dL — AB (ref 6.5–8.1)
Total Bilirubin: 0.4 mg/dL (ref 0.3–1.2)
Total Protein: 6.7 g/dL (ref 6.5–8.1)

## 2017-10-04 LAB — CBC WITH DIFFERENTIAL/PLATELET
BASOS ABS: 0.1 10*3/uL (ref 0.0–0.1)
BASOS PCT: 1 %
EOS PCT: 1 %
Eosinophils Absolute: 0.1 10*3/uL (ref 0.0–0.7)
HEMATOCRIT: 39 % (ref 39.0–52.0)
Hemoglobin: 13 g/dL (ref 13.0–17.0)
Lymphocytes Relative: 26 %
Lymphs Abs: 1.4 10*3/uL (ref 0.7–4.0)
MCH: 30.3 pg (ref 26.0–34.0)
MCHC: 33.3 g/dL (ref 30.0–36.0)
MCV: 90.9 fL (ref 78.0–100.0)
MONO ABS: 1 10*3/uL (ref 0.1–1.0)
MONOS PCT: 19 %
NEUTROS ABS: 2.9 10*3/uL (ref 1.7–7.7)
Neutrophils Relative %: 53 %
PLATELETS: 156 10*3/uL (ref 150–400)
RBC: 4.29 MIL/uL (ref 4.22–5.81)
RDW: 15 % (ref 11.5–15.5)
WBC: 5.5 10*3/uL (ref 4.0–10.5)

## 2017-10-04 LAB — MRSA PCR SCREENING: MRSA by PCR: NEGATIVE

## 2017-10-04 LAB — CBG MONITORING, ED: Glucose-Capillary: 87 mg/dL (ref 65–99)

## 2017-10-04 LAB — ETHANOL: Alcohol, Ethyl (B): 71 mg/dL — ABNORMAL HIGH (ref <10)

## 2017-10-04 LAB — SALICYLATE LEVEL

## 2017-10-04 LAB — HIV ANTIBODY (ROUTINE TESTING W REFLEX): HIV SCREEN 4TH GENERATION: NONREACTIVE

## 2017-10-04 LAB — ACETAMINOPHEN LEVEL: Acetaminophen (Tylenol), Serum: <10 ug/mL — ABNORMAL LOW (ref 10–30)

## 2017-10-04 MED ORDER — KETOROLAC TROMETHAMINE 30 MG/ML IJ SOLN
30.0000 mg | Freq: Four times a day (QID) | INTRAMUSCULAR | Status: DC | PRN
  Administered 2017-10-05: 30 mg via INTRAVENOUS
  Filled 2017-10-04: qty 1

## 2017-10-04 MED ORDER — ENOXAPARIN SODIUM 40 MG/0.4ML ~~LOC~~ SOLN
40.0000 mg | SUBCUTANEOUS | Status: DC
  Administered 2017-10-04 – 2017-10-05 (×2): 40 mg via SUBCUTANEOUS
  Filled 2017-10-04 (×2): qty 0.4

## 2017-10-04 MED ORDER — SODIUM CHLORIDE 0.9 % IV BOLUS
1000.0000 mL | Freq: Once | INTRAVENOUS | Status: AC
  Administered 2017-10-04: 1000 mL via INTRAVENOUS

## 2017-10-04 MED ORDER — ADULT MULTIVITAMIN W/MINERALS CH
1.0000 | ORAL_TABLET | Freq: Every day | ORAL | Status: DC
Start: 2017-10-04 — End: 2017-10-05
  Administered 2017-10-04 – 2017-10-05 (×2): 1 via ORAL
  Filled 2017-10-04 (×2): qty 1

## 2017-10-04 MED ORDER — ACETYLCYSTEINE LOAD VIA INFUSION
150.0000 mg/kg | Freq: Once | INTRAVENOUS | Status: AC
  Administered 2017-10-04: 9525 mg via INTRAVENOUS
  Filled 2017-10-04: qty 239

## 2017-10-04 MED ORDER — IBUPROFEN 200 MG PO TABS
400.0000 mg | ORAL_TABLET | ORAL | Status: DC | PRN
  Administered 2017-10-04 – 2017-10-05 (×2): 400 mg via ORAL
  Filled 2017-10-04: qty 2

## 2017-10-04 MED ORDER — ACETAMINOPHEN 325 MG PO TABS: 650.0000 mg | ORAL_TABLET | Freq: Four times a day (QID) | ORAL | Status: DC | PRN

## 2017-10-04 MED ORDER — DEXTROSE 5 % IV SOLN
15.0000 mg/kg/h | INTRAVENOUS | Status: DC
  Administered 2017-10-04: 15 mg/kg/h via INTRAVENOUS
  Filled 2017-10-04: qty 200

## 2017-10-04 MED ORDER — FOLIC ACID 1 MG PO TABS
1.0000 mg | ORAL_TABLET | Freq: Every day | ORAL | Status: DC
  Administered 2017-10-04 – 2017-10-05 (×2): 1 mg via ORAL
  Filled 2017-10-04 (×2): qty 1

## 2017-10-04 MED ORDER — SODIUM CHLORIDE 0.9 % IV SOLN
INTRAVENOUS | Status: DC
  Administered 2017-10-04: 03:00:00 via INTRAVENOUS

## 2017-10-04 MED ORDER — ACETAMINOPHEN 650 MG RE SUPP: 650.0000 mg | Freq: Four times a day (QID) | RECTAL | Status: DC | PRN

## 2017-10-04 MED ORDER — VITAMIN B-1 100 MG PO TABS
100.0000 mg | ORAL_TABLET | Freq: Every day | ORAL | Status: DC
Start: 2017-10-04 — End: 2017-10-05
  Administered 2017-10-04 – 2017-10-05 (×2): 100 mg via ORAL
  Filled 2017-10-04 (×2): qty 1

## 2017-10-04 MED ORDER — ONDANSETRON HCL 4 MG/2ML IJ SOLN: 4.0000 mg | Freq: Four times a day (QID) | INTRAMUSCULAR | Status: DC | PRN

## 2017-10-04 MED ORDER — SODIUM CHLORIDE 0.9 % IV SOLN
INTRAVENOUS | Status: DC
  Administered 2017-10-04 – 2017-10-05 (×2): via INTRAVENOUS
  Administered 2017-10-05: 100 mL/h via INTRAVENOUS

## 2017-10-04 MED ORDER — ONDANSETRON HCL 4 MG PO TABS
4.0000 mg | ORAL_TABLET | Freq: Four times a day (QID) | ORAL | Status: DC | PRN
  Administered 2017-10-05: 4 mg via ORAL
  Filled 2017-10-04: qty 1

## 2017-10-04 NOTE — H&P (Signed)
History and Physical    Andrew Rivas JXB:[id number] DOB: 1960-06-30 DOA: 10/04/2017  PCP: Patient, No Pcp Per  Patient coming from: Home  I have personally briefly reviewed patient's old medical records in [facility]  Chief Complaint: OD  HPI: Andrew Rivas is a 57 y.o. male with medical history significant of polysubstance abuse, presents to ED after an OD that occurred sometime this evening.  Took trazodone, vistaril, neurontin, and zoloft sometime earlier this evening in apparent suicide attempt.  Was discharged from [facility] yesterday around 1pm it looks like.  3 trazodone, 2 vistaril, 4 neurontin and 1 zoloft missing from scripts given yesterday.   ED Course: LFTs are elevated.  Tylenol level negative but PSN control says we need to treat empirically with acetadote as tylenol OD.   Review of Systems: As per HPI otherwise 10 point review of systems negative.   Past Medical History:  Diagnosis Date  . Alcohol abuse   . Medical history non-contributory     Past Surgical History:  Procedure Laterality Date  . HAND SURGERY Left   . NO PAST SURGERIES       reports that he has been smoking.  He has been smoking about 1.00 pack per day. He has never used smokeless tobacco. He reports that he drinks alcohol. He reports that he has current or past drug history. Drugs: Marijuana and Oxycodone.  No Known Allergies  History reviewed. No pertinent family history.   Prior to Admission medications   Medication Sig Start Date End Date Taking? Authorizing Provider  gabapentin (NEURONTIN) 300 MG capsule Take 1 capsule (300 mg total) by mouth 3 (three) times daily. 10/03/17  Yes Charm Rings, NP  hydrOXYzine (ATARAX/VISTARIL) 25 MG tablet Take 1 tablet (25 mg total) by mouth 3 (three) times daily as needed for anxiety. 10/03/17  Yes Charm Rings, NP  sertraline (ZOLOFT) 50 MG tablet Take 1 tablet (50 mg total) by mouth daily. 10/04/17  Yes Charm Rings, NP  traZODone  (DESYREL) 50 MG tablet Take 1 tablet (50 mg total) by mouth at bedtime as needed for sleep. 10/03/17  Yes Charm Rings, NP    Physical Exam: Vitals:   10/04/17 0300 10/04/17 0330 10/04/17 0401 10/04/17 0430  BP: 126/83 118/77 113/75 110/86  Pulse: 88 86 89 87  Resp: Temp:      TempSrc:      SpO2: 97% 94% 95% 95%  Weight:      Height:        Constitutional: NAD, calm, comfortable Eyes: PERRL, lids and conjunctivae normal ENMT: Mucous membranes are moist. Posterior pharynx clear of any exudate or lesions.Normal dentition.  Neck: normal, supple, no masses, no thyromegaly Respiratory: clear to auscultation bilaterally, no wheezing, no crackles. Normal respiratory effort. No accessory muscle use.  Cardiovascular: Regular rate and rhythm, no murmurs / rubs / gallops. No extremity edema. 2+ pedal pulses. No carotid bruits.  Abdomen: no tenderness, no masses palpated. No hepatosplenomegaly. Bowel sounds positive.  Musculoskeletal: no clubbing / cyanosis. No joint deformity upper and lower extremities. Good ROM, no contractures. Normal muscle tone.  Skin: no rashes, lesions, ulcers. No induration Neurologic: CN 2-12 grossly intact. Sensation intact, DTR normal. Strength 5/5 in all 4.  Psychiatric: Normal judgment and insight. Alert and oriented x 3. Normal mood.    Labs on Admission: I have personally reviewed following labs and imaging studies  CBC: Recent Labs  Lab 10/04/17 0221  WBC  5.5  NEUTROABS 2.9  HGB 13.0  HCT 39.0  MCV 90.9  PLT 156   Basic Metabolic Panel: Recent Labs  Lab 10/04/17 0221  NA 139  K 4.1  CL 102  CO2 25  GLUCOSE 92  BUN 10  CREATININE 0.75  CALCIUM 8.6*   GFR: Estimated Creatinine Clearance: 94.8 mL/min (by C-G formula based on SCr of 0.75 mg/dL). Liver Function Tests: Recent Labs  Lab 10/04/17 0221  AST 182*  ALT 231*  ALKPHOS 94  BILITOT 0.4  PROT 6.7  ALBUMIN 3.5   No results for input(s): LIPASE, AMYLASE in the  last 168 hours. No results for input(s): AMMONIA in the last 168 hours. Coagulation Profile: No results for input(s): INR, PROTIME in the last 168 hours. Cardiac Enzymes: Recent Labs  Lab 09/30/17 0630  CKTOTAL 81   BNP (last 3 results) No results for input(s): PROBNP in the last 8760 hours. HbA1C: No results for input(s): HGBA1C in the last 72 hours. CBG: Recent Labs  Lab 10/04/17 0220  GLUCAP 87   Lipid Profile: No results for input(s): CHOL, HDL, LDLCALC, TRIG, CHOLHDL, LDLDIRECT in the last 72 hours. Thyroid Function Tests: No results for input(s): TSH, T4TOTAL, FREET4, T3FREE, THYROIDAB in the last 72 hours. Anemia Panel: No results for input(s): VITAMINB12, FOLATE, FERRITIN, TIBC, IRON, RETICCTPCT in the last 72 hours. Urine analysis:    Component Value Date/Time   COLORURINE YELLOW 09/26/2017 1911   APPEARANCEUR HAZY (A) 09/26/2017 1911   LABSPEC 1.011 09/26/2017 1911   PHURINE 5.0 09/26/2017 1911   GLUCOSEU NEGATIVE 09/26/2017 1911   HGBUR NEGATIVE 09/26/2017 1911   BILIRUBINUR NEGATIVE 09/26/2017 1911   KETONESUR 5 (A) 09/26/2017 1911   PROTEINUR NEGATIVE 09/26/2017 1911   NITRITE NEGATIVE 09/26/2017 1911   LEUKOCYTESUR NEGATIVE 09/26/2017 1911    Radiological Exams on Admission: No results found.  EKG: Independently reviewed.  Assessment/Plan Principal Problem:   Overdose, undetermined intent, initial encounter Active Problems:   Alcohol abuse   Transaminitis    1. OD - 1. Given transaminitis treating empirically as tylenol OD with NAC at instructions of PSN control 2. EtOH abuse - 1. EtOH level 70 2. Watch for withdrawal, but hopefully not since he was just at [facility] up until 1pm yesterday. 3. Transaminitis - 1. Repeat CMP tomorrow AM 2. RUQ [place] 3. Hepatitis pnl pending  DVT prophylaxis: lovenox Code Status: Full Family Communication: No family in room Disposition Plan: Home after admit Consults called: None Admission status: Place in  obs   Kashari Chalmers, Heywood Iles. DO [facility] Pager [phone]  If 7AM-7PM, please contact day team taking care of patient [email or web address] Password [email or web address]  10/04/2017, 4:55 AM

## 2017-10-04 NOTE — ED Provider Notes (Addendum)
TIME SEEN: 2:00 AM  CHIEF COMPLAINT: Drug overdose  HPI: Patient is a 57 year old male with history of polysubstance abuse who presents to the emergency department after an overdose that occurred sometime this evening.  States that he took several trazodone, Vistaril, Neurontin and Zoloft sometime tonight when it was "getting dark".  States he took these medications in an attempt to kill himself.  Per EMS, patient was just discharged from [facility] and was given prescriptions for medications.  Has 3 trazodone missing, 2 Vistaril missing, for Neurontin missing and 1 Zoloft missing.  Also reports drinking several beers today.  ROS: See HPI Constitutional: no fever  Eyes: no drainage  ENT: no runny nose   Cardiovascular:  no chest pain  Resp: no SOB  GI: no vomiting GU: no dysuria Integumentary: no rash  Allergy: no hives  Musculoskeletal: no leg swelling  Neurological: no slurred speech ROS otherwise negative  PAST MEDICAL HISTORY/PAST SURGICAL HISTORY:  Past Medical History:  Diagnosis Date  . Alcohol abuse   . Medical history non-contributory     MEDICATIONS:  Prior to Admission medications   Medication Sig Start Date End Date Taking? Authorizing Provider  gabapentin (NEURONTIN) 300 MG capsule Take 1 capsule (300 mg total) by mouth 3 (three) times daily. 10/03/17   Charm Rings, NP  hydrOXYzine (ATARAX/VISTARIL) 25 MG tablet Take 1 tablet (25 mg total) by mouth 3 (three) times daily as needed for anxiety. 10/03/17   Charm Rings, NP  sertraline (ZOLOFT) 50 MG tablet Take 1 tablet (50 mg total) by mouth daily. 10/04/17   Charm Rings, NP  traZODone (DESYREL) 50 MG tablet Take 1 tablet (50 mg total) by mouth at bedtime as needed for sleep. 10/03/17   Charm Rings, NP    ALLERGIES:  No Known Allergies  SOCIAL HISTORY:  Social History   Tobacco Use  . Smoking status: Current Every Day Smoker    Packs/day: 1.00  . Smokeless tobacco: Never Used  Substance Use Topics   . Alcohol use: Yes    Comment: daily     FAMILY HISTORY: No family history on file.  EXAM: BP 113/83 (BP Location: Right Arm)   Pulse 90   Temp 98.1 F (36.7 C) (Oral)   Resp 16   Ht 6' (1.829 m)   Wt 63.5 kg (140 lb)   SpO2 97%   BMI 18.99 kg/m  CONSTITUTIONAL: Alert and oriented and responds appropriately to questions.  Chronically ill-appearing, drowsy but arousable HEAD: Normocephalic EYES: Conjunctivae clear, pupils appear equal, EOMI ENT: normal nose; moist mucous membranes NECK: Supple, no meningismus, no nuchal rigidity, no LAD  CARD: RRR; S1 and S2 appreciated; no murmurs, no clicks, no rubs, no gallops RESP: Normal chest excursion without splinting or tachypnea; breath sounds clear and equal bilaterally; no wheezes, no rhonchi, no rales, no hypoxia or respiratory distress, speaking full sentences ABD/GI: Normal bowel sounds; non-distended; soft, non-tender, no rebound, no guarding, no peritoneal signs, no hepatosplenomegaly BACK:  The back appears normal and is non-tender to palpation, there is no CVA tenderness EXT: Normal ROM in all joints; non-tender to palpation; no edema; normal capillary refill; no cyanosis, no calf tenderness or swelling    SKIN: Normal color for age and race; warm; no rash NEURO: Moves all extremities equally PSYCH: Doris is SI.  MEDICAL DECISION MAKING: Patient here with suicidal ideation and drug overdose.  Nurse will discuss with poison control.  Will obtain labs, urine, EKG. we will consult TTS  once patient is medically cleared.  ED PROGRESS: Patient's work-up unremarkable other than  elevated AST and ALT.  No abdominal tenderness on examination.  Alcohol level is 71.  He is still drowsy.  Poison control recommends observation until he is no longer drowsy and that he will be medically cleared and we can have TTS see the patient.  Discussed patient's elevated liver function tests with poison control and they recommend giving N-acetylcysteine  given his LFTs have more than tripled and are higher than his baseline.  Initial Tylenol level is negative.  States given that we do not know when he overdosed, we need to treat this as a potential Tylenol overdose given elevated liver function test.  Poison control does not believe any of the other medications on his list would have caused a rise in his LFTs today.  Poison control recommends rechecking CMP in the morning.  We will also check hepatitis panel.  Abdominal exam is benign.  Will discuss with medicine for admission.  He is under IVC.   4:52 AM Discussed patient's case with hospitalist, Dr. Julian Reil.  I have recommended admission and patient (and family if present) agree with this plan. Admitting physician will place admission orders.   I have written and submitted IVC papers on patient.  I reviewed all nursing notes, vitals, pertinent previous records, EKGs, lab and urine results, imaging (as available).     EKG Interpretation  Date/Time:  Thursday Oct 04 2017 01:53:32 EDT Ventricular Rate:  90 PR Interval:    QRS Duration: 88 QT Interval:  357 QTC Calculation: 437 R Axis:   82 Text Interpretation:  Sinus rhythm Probable left atrial enlargement ST elev, probable normal early repol pattern No significant change since last tracing Confirmed by Rochele Raring [phone]) on 10/04/2017 2:22:35 AM        CRITICAL CARE Performed by: Baxter Hire Ward   Total critical care time: 45 minutes  Critical care time was exclusive of separately billable procedures and treating other patients.  Critical care was necessary to treat or prevent imminent or life-threatening deterioration.  Critical care was time spent personally by me on the following activities: development of treatment plan with patient and/or surrogate as well as nursing, discussions with consultants, evaluation of patient's response to treatment, examination of patient, obtaining history from patient or surrogate, ordering and  performing treatments and interventions, ordering and review of laboratory studies, ordering and review of radiographic studies, pulse oximetry and re-evaluation of patient's condition.      Ward, Layla Maw, DO 10/04/17 0452    Ward, Layla Maw, DO 10/04/17 4403

## 2017-10-04 NOTE — ED Notes (Signed)
Bed: WA17 Expected date:  Expected time:  Means of arrival:  Comments: 
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
## 2017-10-04 NOTE — ED Notes (Signed)
Pt placed on cardiac monitoring and in hospital gown. Pt's belongings placed in cabinets marked "16-18/Resus A"

## 2017-10-04 NOTE — ED Notes (Signed)
Pt is aware a urine specimen is needed.  

## 2017-10-04 NOTE — Progress Notes (Signed)
MEDICATION RELATED CONSULT NOTE - INITIAL   Pharmacy Consult for Acetadote Indication: OD  No Known Allergies  Patient Measurements: Height: 6' (182.9 cm) Weight: 140 lb (63.5 kg) IBW/kg (Calculated) : 77.6   Vital Signs: Temp: 98.1 F (36.7 C) (05/16 0153) Temp Source: Oral (05/16 0153) BP: 114/80 (05/16 0600) Pulse Rate: 92 (05/16 0600) Intake/Output from previous day: 05/15 0701 - 05/16 0700 In: 1000 [IV Piggyback:1000] Out: -  Intake/Output from this shift: Total I/O In: 1000 [IV Piggyback:1000] Out: -   Labs: Recent Labs    10/04/17 0221  WBC 5.5  HGB 13.0  HCT 39.0  PLT 156  CREATININE 0.75  ALBUMIN 3.5  PROT 6.7  AST 182*  ALT 231*  ALKPHOS 94  BILITOT 0.4   Estimated Creatinine Clearance: 94.8 mL/min (by C-G formula based on SCr of 0.75 mg/dL).   Microbiology: No results found for this or any previous visit (from the past 720 hour(s)).  Medical History: Past Medical History:  Diagnosis Date  . Alcohol abuse   . Medical history non-contributory       Assessment: 12 yoM with history of polysubstance abuse discharged 5/15 from [facility] now with OD on trazodone, vistaril, neurontin and zoloft.  Tylenol level negative but PC says to treat empirically with acetadote. LFTs are elevated. AST=182 and ALT=231  Goal of Therapy:  Prevent liver toxicity  Plan:  Acetadote 150 mg/kg over 1 hour then 15 mg/kg/hr F/u PC recommendations, Apap level, labs  Susanne Greenhouse R 10/04/2017,6:15 AM

## 2017-10-04 NOTE — ED Notes (Signed)
Called lab, will add on hepatitis panel to existing blood work.

## 2017-10-04 NOTE — ED Notes (Signed)
ED TO INPATIENT HANDOFF REPORT  Name/Age/Gender Andrew Rivas 57 y.o. male  Code Status    Code Status Orders  (From admission, onward)        Start     Ordered   10/04/17 0518  Full code  Continuous     10/04/17 0519    Code Status History    Date Active Date Inactive Code Status Order ID Comments User Context   10/04/2017 0439 10/04/2017 0519 Full Code [id number]  Ward, Delice Bison, DO ED   09/28/2017 1534 10/03/2017 2035 Full Code [id number]  Lewis Shock, FNP Inpatient   09/26/2017 2202 09/28/2017 1432 Full Code [id number]  Valarie Merino, MD ED      Home/SNF/Other Home  Chief Complaint overdose  Level of Care/Admitting Diagnosis ED Disposition    ED Disposition Condition [facility] Area: [facility] [100102]  Level of Care: Stepdown [14]  Admit to SDU based on following criteria: Other see comments  Comments: N-acetylcystine  Diagnosis: OD (overdose of drug) [315400]  Admitting Physician: Doreatha Massed  Attending Physician: Etta Quill [phone]  Estimated length of stay: past midnight tomorrow  Certification:: I certify this patient will need inpatient services for at least 2 midnights  PT Class (Do Not Modify): Inpatient [101]  PT Acc Code (Do Not Modify): Private [1]       Medical History Past Medical History:  Diagnosis Date  . Alcohol abuse   . Medical history non-contributory     Allergies No Known Allergies  IV Location/Drains/Wounds Patient Lines/Drains/Airways Status   Active Line/Drains/Airways    Name:   Placement date:   Placement time:   Site:   Days:   Peripheral IV 10/04/17 Left Antecubital   10/04/17    0147    Antecubital   less than 1          Labs/Imaging Results for orders placed or performed during the hospital encounter of 10/04/17 (from the past 48 hour(s))  CBG monitoring, ED     Status: None   Collection Time: 10/04/17  2:20 AM  Result Value Ref Range   Glucose-Capillary  87 65 - 99 mg/dL  Comprehensive metabolic panel     Status: Abnormal   Collection Time: 10/04/17  2:21 AM  Result Value Ref Range   Sodium 139 135 - 145 mmol/L   Potassium 4.1 3.5 - 5.1 mmol/L   Chloride 102 101 - 111 mmol/L   CO2 25 22 - 32 mmol/L   Glucose, Bld 92 65 - 99 mg/dL   BUN 10 6 - 20 mg/dL   Creatinine, Ser 0.75 0.61 - 1.24 mg/dL   Calcium 8.6 (L) 8.9 - 10.3 mg/dL   Total Protein 6.7 6.5 - 8.1 g/dL   Albumin 3.5 3.5 - 5.0 g/dL   AST 182 (H) 15 - 41 U/L   ALT 231 (H) 17 - 63 U/L   Alkaline Phosphatase 94 38 - 126 U/L   Total Bilirubin 0.4 0.3 - 1.2 mg/dL   GFR calc non Af Amer >60 >60 mL/min   GFR calc Af Amer >60 >60 mL/min    Comment: (NOTE) The eGFR has been calculated using the CKD EPI equation. This calculation has not been validated in all clinical situations. eGFR's persistently <60 mL/min signify possible Chronic Kidney Disease.    Anion gap 12 5 - 15    Comment: Performed at [facility], [place] [address] [place], [place] [address]  Salicylate level     Status: None   Collection Time: 10/04/17  2:21 AM  Result Value Ref Range   Salicylate Lvl <0.2 2.8 - 30.0 mg/dL    Comment: Performed at [facility], [place] [address] [place], [place] [address]  Acetaminophen level     Status: Abnormal   Collection Time: 10/04/17  2:21 AM  Result Value Ref Range   Acetaminophen (Tylenol), Serum <10 (L) 10 - 30 ug/mL    Comment: (NOTE) Therapeutic concentrations vary significantly. A range of 10-30 ug/mL  may be an effective concentration for many patients. However, some  are best treated at concentrations outside of this range. Acetaminophen concentrations >150 ug/mL at 4 hours after ingestion  and >50 ug/mL at 12 hours after ingestion are often associated with  toxic reactions. Performed at [facility], [place] [address], [place], [place] [address]   Ethanol     Status: Abnormal   Collection Time: 10/04/17  2:21  AM  Result Value Ref Range   Alcohol, Ethyl (B) 71 (H) <10 mg/dL    Comment: (NOTE) Lowest detectable limit for serum alcohol is 10 mg/dL. For medical purposes only. Performed at [facility], [place] [address], [place], [place] [address]   Urine rapid drug screen (hosp performed)     Status: Abnormal   Collection Time: 10/04/17  2:21 AM  Result Value Ref Range   Opiates NONE DETECTED NONE DETECTED   Cocaine NONE DETECTED NONE DETECTED   Benzodiazepines POSITIVE (A) NONE DETECTED   Amphetamines NONE DETECTED NONE DETECTED   Tetrahydrocannabinol NONE DETECTED NONE DETECTED   Barbiturates NONE DETECTED NONE DETECTED    Comment: (NOTE) DRUG SCREEN FOR MEDICAL PURPOSES ONLY.  IF CONFIRMATION IS NEEDED FOR ANY PURPOSE, NOTIFY LAB WITHIN 5 DAYS. LOWEST DETECTABLE LIMITS FOR URINE DRUG SCREEN Drug Class                     Cutoff (ng/mL) Amphetamine and metabolites    1000 Barbiturate and metabolites    200 Benzodiazepine                 341 Tricyclics and metabolites     300 Opiates and metabolites        300 Cocaine and metabolites        300 THC                            50 Performed at [facility], [place] [address] [place], [place] [address]   CBC WITH DIFFERENTIAL     Status: None   Collection Time: 10/04/17  2:21 AM  Result Value Ref Range   WBC 5.5 4.0 - 10.5 K/uL   RBC 4.29 4.22 - 5.81 MIL/uL   Hemoglobin 13.0 13.0 - 17.0 g/dL   HCT 39.0 39.0 - 52.0 %   MCV 90.9 78.0 - 100.0 fL   MCH 30.3 26.0 - 34.0 pg   MCHC 33.3 30.0 - 36.0 g/dL   RDW 15.0 11.5 - 15.5 %   Platelets 156 150 - 400 K/uL   Neutrophils Relative % 53 %   Neutro Abs 2.9 1.7 - 7.7 K/uL   Lymphocytes Relative 26 %   Lymphs Abs 1.4 0.7 - 4.0 K/uL   Monocytes Relative 19 %   Monocytes Absolute 1.0 0.1 - 1.0 K/uL   Eosinophils Relative 1 %   Eosinophils Absolute 0.1 0.0 - 0.7  K/uL   Basophils Relative 1 %   Basophils Absolute 0.1 0.0 - 0.1 K/uL    Comment:  Performed at [facility], [place] [address] [place], [place] [address]  HIV antibody (Routine Testing)     Status: None   Collection Time: 10/04/17  2:21 AM  Result Value Ref Range   HIV Screen 4th Generation wRfx Non Reactive Non Reactive    Comment: (NOTE) Performed At: [facility] [place], [place] [id number] Rush Farmer MD NI:[id number] Performed at [facility], [place] [address], [place], [place] [address]   Comprehensive metabolic panel     Status: Abnormal   Collection Time: 10/04/17  8:00 AM  Result Value Ref Range   Sodium 136 135 - 145 mmol/L   Potassium 4.3 3.5 - 5.1 mmol/L   Chloride 102 101 - 111 mmol/L   CO2 24 22 - 32 mmol/L   Glucose, Bld 101 (H) 65 - 99 mg/dL   BUN 10 6 - 20 mg/dL   Creatinine, Ser 0.58 (L) 0.61 - 1.24 mg/dL   Calcium 8.0 (L) 8.9 - 10.3 mg/dL   Total Protein 6.2 (L) 6.5 - 8.1 g/dL   Albumin 3.2 (L) 3.5 - 5.0 g/dL   AST 126 (H) 15 - 41 U/L   ALT 201 (H) 17 - 63 U/L   Alkaline Phosphatase 81 38 - 126 U/L   Total Bilirubin 0.2 (L) 0.3 - 1.2 mg/dL   GFR calc non Af Amer >60 >60 mL/min   GFR calc Af Amer >60 >60 mL/min    Comment: (NOTE) The eGFR has been calculated using the CKD EPI equation. This calculation has not been validated in all clinical situations. eGFR's persistently <60 mL/min signify possible Chronic Kidney Disease.    Anion gap 10 5 - 15    Comment: Performed at [facility], [place] [address] [place], [place] [address]   US Abdomen Limited Ruq  Result Date: 10/04/2017 CLINICAL DATA:  Acute onset of transaminitis.  Drug overdose. EXAM: ULTRASOUND ABDOMEN LIMITED RIGHT UPPER QUADRANT COMPARISON:  None. FINDINGS: Gallbladder: No gallstones or wall thickening visualized. No sonographic Murphy sign noted by sonographer. Common bile duct: Diameter: 0.6 cm, within normal limits in caliber. Liver: No focal lesion identified. Within normal limits in parenchymal  echogenicity. Portal vein is patent on color Doppler imaging with normal direction of blood flow towards the liver. IMPRESSION: Unremarkable ultrasound of the right upper quadrant. Electronically Signed   By: Garald Balding M.D.   On: 10/04/2017 06:24    Pending Labs Unresulted Labs (From admission, onward)   Start     Ordered   10/05/17 0500  Comprehensive metabolic panel  Tomorrow morning,   R     10/04/17 0458   10/04/17 0445  Hepatitis panel, acute  STAT,   STAT     10/04/17 0444      Vitals/Pain Today's Vitals   10/04/17 1156 10/04/17 1459 10/04/17 1545 10/04/17 1654  BP: 110/72 116/80 113/82   Pulse: 89 82 84   Resp: '15 14 15   '$ Temp: 98.1 F (36.7 C) 98 F (36.7 C) 98.1 F (36.7 C)   TempSrc: Oral Oral Oral   SpO2: 96% 99% 98%   Weight:      Height:      PainSc:    5     Isolation Precautions No active isolations  Medications Medications  sodium chloride 0.9 % bolus 1,000 mL (0 mLs Intravenous Stopped 10/04/17  0501)    And  0.9 %  sodium chloride infusion ( Intravenous Stopped 10/04/17 1655)  ondansetron (ZOFRAN) tablet 4 mg (has no administration in time range)    Or  ondansetron (ZOFRAN) injection 4 mg (has no administration in time range)  enoxaparin (LOVENOX) injection 40 mg (40 mg Subcutaneous Given 10/04/17 1648)  multivitamin with minerals tablet 1 tablet (1 tablet Oral Given 10/04/17 1649)  thiamine (VITAMIN B-1) tablet 100 mg (100 mg Oral Given 9/76/73 4193)  folic acid (FOLVITE) tablet 1 mg (1 mg Oral Given 10/04/17 1649)  ibuprofen (ADVIL,MOTRIN) tablet 400 mg (400 mg Oral Given 10/04/17 1649)  0.9 %  sodium chloride infusion ( Intravenous New Bag/Given 10/04/17 1650)  ketorolac (TORADOL) 30 MG/ML injection 30 mg (has no administration in time range)  acetylcysteine (ACETADOTE) 40 mg/mL load via infusion 9,525 mg (9,525 mg Intravenous Bolus from Bag 10/04/17 0535)    Mobility walks

## 2017-10-04 NOTE — ED Triage Notes (Signed)
Pt BIB [facility] from [employer] [facility]. Pt released from [facility] earlier today, was given weeks worth of meds. Pt missing 1 Zoloft, 3 trazodone, 2 vistaril, 4 gabapentin. Pt also admits to drinking beer with it. Pt admits to trying to kill him self, and wishes he had taken more. Pt lethargic but able to able to answer all questions appropriately .

## 2017-10-04 NOTE — Progress Notes (Signed)
   10/04/17 1200  Clinical Encounter Type  Visited With Patient  Visit Type Initial;Psychological support;Spiritual support;ED  Referral From Nurse  Consult/Referral To Chaplain  Spiritual Encounters  Spiritual Needs Emotional;Prayer;Other (Comment) (Spiritual Care Conversation/Support)  Stress Factors  Patient Stress Factors Family relationships;Health changes;Loss;Major life changes   I visited with the patient per Spiritual Care consult. The patient stated that he has been going through a difficult time and has been upset over his nephew's recent attempt at suicide. Mr. Laymon stated that he doesn't have many relatives left, and this has effected him emotionally. He states that he has been having trouble getting his scooter fixed, and this has overwhelmed him.  Mr. Bordas requested prayer. He states that he wants to get better and not have "these thoughts of suicide."   Please, contact Spiritual Care for further assistance.   Chaplain Clint Bolder M.Div., [facility]

## 2017-10-04 NOTE — ED Notes (Signed)
Poison control rec: EKG, acetaminophen level, CMP, and alcohol level. They recommend observation until asymptomatic. Observe for drowsiness and ataxia.

## 2017-10-04 NOTE — ED Notes (Signed)
Pt reminded a urine specimen is needed 

## 2017-10-04 NOTE — Progress Notes (Signed)
PROGRESS NOTE    Andrew Rivas   WUJ:[id number]  DOB: 1960/11/17  DOA: 10/04/2017 PCP: Patient, No Pcp Per   Brief Narrative:  Andrew Rivas is a 57 y/o with alcohol abuse and depression who was released from [facility] on 5/15 and came back to the ED the same night. He tells me that he went to a bar and started to drink and talk to someone there. He then took his meds which he had with him in his bag. A while later he decided to take more and continued to drink. He tells me that he had suicidal thoughts but also states the Trazodone made him feel better which is why he took more. He did take repeated doses of all of his meds taking 2-3 tab at at time (Zoloft, Neurontin, Trazodone and Atarax) but does not know how many meds he took.   After the left the bar around midnight, he had to stop at a [facility] to use the bathroom. He was leaving but then felt very light headed and off balance and asked to be taken to the ED.  His LFTs were noted to be elevated and poison controlled recommended an acetylcysteine drip. He did not admit to taking APAP and his level was not elevated   Subjective: Nauseated. Has had multiple watery stools. No abdominal pain. He has a severe headache. Still feels suicidal.  "not the first time and probably not the last time" that he will try to commit suicide    Assessment & Plan:   Principal Problem:   Overdose, undetermined intent - hold all home meds- EGK / tele shows NSR- cont follow on telemetry- obtain psych eval     Alcohol abuse - should not have withdrawal symptoms has he was in [facility] yesterday    Transaminitis - possibly due to Trazodone- do not recommend that it be resumed in an alcoholic  Headache - Toradol and Ibuprofen PRN   DVT prophylaxis: Lovenox Code Status: Full code Family Communication:  Disposition Plan: per psych Consultants:   psych Procedures:   none Antimicrobials:  Anti-infectives (From admission, onward)   None        Objective: Vitals:   10/04/17 0530 10/04/17 0600 10/04/17 0806 10/04/17 1156  BP: 109/73 114/80 117/77 110/72  Pulse: 94 92 85 89  Resp: Temp:    98.1 F (36.7 C)  TempSrc:    Oral  SpO2: 93% 93% 96% 96%  Weight:      Height:        Intake/Output Summary (Last 24 hours) at 10/04/2017 1459 Last data filed at 10/04/2017 1300 Gross per 24 hour  Intake 1480 ml  Output 1000 ml  Net 480 ml   Filed Weights   10/04/17 0153  Weight: 63.5 kg (140 lb)    Examination: General exam: Appears comfortable  HEENT: PERRLA, oral mucosa moist, no sclera icterus or thrush Respiratory system: Clear to auscultation. Respiratory effort normal. Cardiovascular system: S1 & S2 heard, RRR.   Gastrointestinal system: Abdomen soft, non-tender, nondistended. Normal bowel sound. No organomegaly Central nervous system: Alert and oriented. No focal neurological deficits. Extremities: No cyanosis, clubbing or edema Skin: No rashes or ulcers Psychiatry:  Mood & affect appropriate.     Data Reviewed: I have personally reviewed following labs and imaging studies  CBC: Recent Labs  Lab 10/04/17 0221  WBC 5.5  NEUTROABS 2.9  HGB 13.0  HCT 39.0  MCV 90.9  PLT 156  Basic Metabolic Panel: Recent Labs  Lab 10/04/17 0221 10/04/17 0800  NA 139 136  K 4.1 4.3  CL 102 102  CO2 25 24  GLUCOSE 92 101*  BUN 10 10  CREATININE 0.75 0.58*  CALCIUM 8.6* 8.0*   GFR: Estimated Creatinine Clearance: 94.8 mL/min (A) (by C-G formula based on SCr of 0.58 mg/dL (L)). Liver Function Tests: Recent Labs  Lab 10/04/17 0221 10/04/17 0800  AST 182* 126*  ALT 231* 201*  ALKPHOS 94 81  BILITOT 0.4 0.2*  PROT 6.7 6.2*  ALBUMIN 3.5 3.2*   No results for input(s): LIPASE, AMYLASE in the last 168 hours. No results for input(s): AMMONIA in the last 168 hours. Coagulation Profile: No results for input(s): INR, PROTIME in the last 168 hours. Cardiac Enzymes: Recent Labs  Lab  09/30/17 0630  CKTOTAL 81   BNP (last 3 results) No results for input(s): PROBNP in the last 8760 hours. HbA1C: No results for input(s): HGBA1C in the last 72 hours. CBG: Recent Labs  Lab 10/04/17 0220  GLUCAP 87   Lipid Profile: No results for input(s): CHOL, HDL, LDLCALC, TRIG, CHOLHDL, LDLDIRECT in the last 72 hours. Thyroid Function Tests: No results for input(s): TSH, T4TOTAL, FREET4, T3FREE, THYROIDAB in the last 72 hours. Anemia Panel: No results for input(s): VITAMINB12, FOLATE, FERRITIN, TIBC, IRON, RETICCTPCT in the last 72 hours. Urine analysis:    Component Value Date/Time   COLORURINE YELLOW 09/26/2017 1911   APPEARANCEUR HAZY (A) 09/26/2017 1911   LABSPEC 1.011 09/26/2017 1911   PHURINE 5.0 09/26/2017 1911   GLUCOSEU NEGATIVE 09/26/2017 1911   HGBUR NEGATIVE 09/26/2017 1911   BILIRUBINUR NEGATIVE 09/26/2017 1911   KETONESUR 5 (A) 09/26/2017 1911   PROTEINUR NEGATIVE 09/26/2017 1911   NITRITE NEGATIVE 09/26/2017 1911   LEUKOCYTESUR NEGATIVE 09/26/2017 1911   Sepsis Labs: (procalcitonin:4,lacticidven:4) )No results found for this or any previous visit (from the past 240 hour(s)).       Radiology Studies: US Abdomen Limited Ruq  Result Date: 10/04/2017 CLINICAL DATA:  Acute onset of transaminitis.  Drug overdose. EXAM: ULTRASOUND ABDOMEN LIMITED RIGHT UPPER QUADRANT COMPARISON:  None. FINDINGS: Gallbladder: No gallstones or wall thickening visualized. No sonographic Murphy sign noted by sonographer. Common bile duct: Diameter: 0.6 cm, within normal limits in caliber. Liver: No focal lesion identified. Within normal limits in parenchymal echogenicity. Portal vein is patent on color Doppler imaging with normal direction of blood flow towards the liver. IMPRESSION: Unremarkable ultrasound of the right upper quadrant. Electronically Signed   By: Roanna Raider M.D.   On: 10/04/2017 06:24      Scheduled Meds: . enoxaparin (LOVENOX) injection  40 mg  Subcutaneous Q24H  . folic acid  1 mg Oral Daily  . multivitamin with minerals  1 tablet Oral Daily  . thiamine  100 mg Oral Daily   Continuous Infusions: . sodium chloride 125 mL/hr at 10/04/17 0233  . sodium chloride    . acetylcysteine 15 mg/kg/hr (10/04/17 0539)     LOS: 0 days    Time spent in minutes: 35    Calvert Cantor, MD [facility] Pager: [email or web address] Password [email or web address] 10/04/2017, 2:59 PM

## 2017-10-05 ENCOUNTER — Other Ambulatory Visit: Payer: Self-pay

## 2017-10-05 ENCOUNTER — Inpatient Hospital Stay (HOSPITAL_COMMUNITY)
Admission: AD | Admit: 2017-10-05 | Discharge: 2017-10-09 | DRG: 885 | Disposition: A | Discharge status: Home / Self Care | Payer: No Typology Code available for payment source | Source: Intra-hospital | Attending: Psychiatry | Admitting: Psychiatry

## 2017-10-05 ENCOUNTER — Encounter (HOSPITAL_COMMUNITY): Payer: Self-pay

## 2017-10-05 DIAGNOSIS — F1010 Alcohol abuse, uncomplicated: Secondary | ICD-10-CM | POA: Diagnosis present

## 2017-10-05 DIAGNOSIS — F322 Major depressive disorder, single episode, severe without psychotic features: Secondary | ICD-10-CM | POA: Diagnosis present

## 2017-10-05 DIAGNOSIS — T50902A Poisoning by unspecified drugs, medicaments and biological substances, intentional self-harm, initial encounter: Secondary | ICD-10-CM

## 2017-10-05 DIAGNOSIS — R740 Nonspecific elevation of levels of transaminase and lactic acid dehydrogenase [LDH]: Secondary | ICD-10-CM

## 2017-10-05 DIAGNOSIS — T1491XA Suicide attempt, initial encounter: Secondary | ICD-10-CM

## 2017-10-05 DIAGNOSIS — F1020 Alcohol dependence, uncomplicated: Secondary | ICD-10-CM | POA: Diagnosis present

## 2017-10-05 DIAGNOSIS — F332 Major depressive disorder, recurrent severe without psychotic features: Secondary | ICD-10-CM | POA: Diagnosis present

## 2017-10-05 DIAGNOSIS — R45851 Suicidal ideations: Secondary | ICD-10-CM | POA: Diagnosis present

## 2017-10-05 DIAGNOSIS — Z79899 Other long term (current) drug therapy: Secondary | ICD-10-CM | POA: Diagnosis not present

## 2017-10-05 DIAGNOSIS — R945 Abnormal results of liver function studies: Secondary | ICD-10-CM

## 2017-10-05 DIAGNOSIS — F1490 Cocaine use, unspecified, uncomplicated: Secondary | ICD-10-CM | POA: Diagnosis not present

## 2017-10-05 DIAGNOSIS — G4700 Insomnia, unspecified: Secondary | ICD-10-CM | POA: Diagnosis present

## 2017-10-05 DIAGNOSIS — Y908 Blood alcohol level of 240 mg/100 ml or more: Secondary | ICD-10-CM | POA: Diagnosis not present

## 2017-10-05 DIAGNOSIS — F1120 Opioid dependence, uncomplicated: Secondary | ICD-10-CM | POA: Diagnosis present

## 2017-10-05 DIAGNOSIS — F1590 Other stimulant use, unspecified, uncomplicated: Secondary | ICD-10-CM | POA: Diagnosis not present

## 2017-10-05 DIAGNOSIS — F17210 Nicotine dependence, cigarettes, uncomplicated: Secondary | ICD-10-CM | POA: Diagnosis present

## 2017-10-05 DIAGNOSIS — F1910 Other psychoactive substance abuse, uncomplicated: Secondary | ICD-10-CM | POA: Diagnosis not present

## 2017-10-05 DIAGNOSIS — F10129 Alcohol abuse with intoxication, unspecified: Secondary | ICD-10-CM | POA: Diagnosis present

## 2017-10-05 DIAGNOSIS — F419 Anxiety disorder, unspecified: Secondary | ICD-10-CM | POA: Diagnosis not present

## 2017-10-05 DIAGNOSIS — F1190 Opioid use, unspecified, uncomplicated: Secondary | ICD-10-CM | POA: Diagnosis not present

## 2017-10-05 DIAGNOSIS — F1290 Cannabis use, unspecified, uncomplicated: Secondary | ICD-10-CM | POA: Diagnosis not present

## 2017-10-05 DIAGNOSIS — F339 Major depressive disorder, recurrent, unspecified: Secondary | ICD-10-CM | POA: Diagnosis present

## 2017-10-05 LAB — HEPATITIS PANEL, ACUTE
HEP B S AG: NEGATIVE
Hep A IgM: NEGATIVE
Hep B C IgM: NEGATIVE

## 2017-10-05 LAB — COMPREHENSIVE METABOLIC PANEL
ALBUMIN: 3.1 g/dL — AB (ref 3.5–5.0)
ALT: 159 U/L — ABNORMAL HIGH (ref 17–63)
ANION GAP: 10 (ref 5–15)
AST: 89 U/L — ABNORMAL HIGH (ref 15–41)
Alkaline Phosphatase: 90 U/L (ref 38–126)
BUN: 17 mg/dL (ref 6–20)
CHLORIDE: 104 mmol/L (ref 101–111)
CO2: 25 mmol/L (ref 22–32)
Calcium: 8.4 mg/dL — ABNORMAL LOW (ref 8.9–10.3)
Creatinine, Ser: 0.72 mg/dL (ref 0.61–1.24)
GFR calc non Af Amer: >60 mL/min (ref >60)
GLUCOSE: 93 mg/dL (ref 65–99)
POTASSIUM: 4 mmol/L (ref 3.5–5.1)
SODIUM: 139 mmol/L (ref 135–145)
Total Bilirubin: 0.4 mg/dL (ref 0.3–1.2)
Total Protein: 6.1 g/dL — ABNORMAL LOW (ref 6.5–8.1)

## 2017-10-05 MED ORDER — CLONIDINE HCL 0.1 MG PO TABS
0.1000 mg | ORAL_TABLET | Freq: Four times a day (QID) | ORAL | Status: DC
  Administered 2017-10-05 – 2017-10-06 (×2): 0.1 mg via ORAL
  Filled 2017-10-05 (×7): qty 1

## 2017-10-05 MED ORDER — NAPROXEN 500 MG PO TABS
500.0000 mg | ORAL_TABLET | Freq: Two times a day (BID) | ORAL | Status: DC | PRN
  Administered 2017-10-05 – 2017-10-08 (×5): 500 mg via ORAL
  Filled 2017-10-05 (×4): qty 1

## 2017-10-05 MED ORDER — IBUPROFEN 400 MG PO TABS: 400.0000 mg | ORAL_TABLET | ORAL | 0 refills | Status: DC | PRN

## 2017-10-05 MED ORDER — LOPERAMIDE HCL 2 MG PO CAPS: 2.0000 mg | ORAL_CAPSULE | ORAL | Status: DC | PRN

## 2017-10-05 MED ORDER — CLONIDINE HCL 0.1 MG PO TABS
ORAL_TABLET | ORAL | Status: AC
  Filled 2017-10-05: qty 1

## 2017-10-05 MED ORDER — LORAZEPAM 1 MG PO TABS: 1.0000 mg | ORAL_TABLET | Freq: Two times a day (BID) | ORAL | Status: DC

## 2017-10-05 MED ORDER — TRAZODONE HCL 50 MG PO TABS
50.0000 mg | ORAL_TABLET | Freq: Every evening | ORAL | Status: DC | PRN
  Administered 2017-10-05 – 2017-10-06 (×2): 50 mg via ORAL
  Filled 2017-10-05 (×2): qty 1

## 2017-10-05 MED ORDER — ALUM & MAG HYDROXIDE-SIMETH 200-200-20 MG/5ML PO SUSP
30.0000 mL | ORAL | Status: DC | PRN
Start: 2017-10-05 — End: 2017-10-09

## 2017-10-05 MED ORDER — METHOCARBAMOL 500 MG PO TABS
500.0000 mg | ORAL_TABLET | Freq: Three times a day (TID) | ORAL | Status: DC | PRN
  Administered 2017-10-05: 500 mg via ORAL

## 2017-10-05 MED ORDER — DICYCLOMINE HCL 20 MG PO TABS: 20.0000 mg | ORAL_TABLET | Freq: Four times a day (QID) | ORAL | Status: DC | PRN

## 2017-10-05 MED ORDER — LORAZEPAM 1 MG PO TABS
ORAL_TABLET | ORAL | Status: AC
  Filled 2017-10-05: qty 1

## 2017-10-05 MED ORDER — VITAMIN B-1 100 MG PO TABS
100.0000 mg | ORAL_TABLET | Freq: Every day | ORAL | Status: DC
  Administered 2017-10-06 – 2017-10-09 (×4): 100 mg via ORAL
  Filled 2017-10-05 (×7): qty 1

## 2017-10-05 MED ORDER — LORAZEPAM 1 MG PO TABS: 1.0000 mg | ORAL_TABLET | Freq: Four times a day (QID) | ORAL | Status: DC | PRN

## 2017-10-05 MED ORDER — ADULT MULTIVITAMIN W/MINERALS CH: 1.0000 | ORAL_TABLET | Freq: Every day | ORAL | Status: DC

## 2017-10-05 MED ORDER — CLONIDINE HCL 0.1 MG PO TABS: 0.1000 mg | ORAL_TABLET | Freq: Every day | ORAL | Status: DC

## 2017-10-05 MED ORDER — CLONIDINE HCL 0.1 MG PO TABS
0.1000 mg | ORAL_TABLET | ORAL | Status: DC
  Filled 2017-10-05 (×2): qty 1

## 2017-10-05 MED ORDER — NICOTINE POLACRILEX 2 MG MT GUM
2.0000 mg | CHEWING_GUM | OROMUCOSAL | Status: DC | PRN
  Administered 2017-10-05: 2 mg via ORAL
  Filled 2017-10-05: qty 1

## 2017-10-05 MED ORDER — HYDROXYZINE HCL 25 MG PO TABS
25.0000 mg | ORAL_TABLET | Freq: Three times a day (TID) | ORAL | Status: DC | PRN
  Administered 2017-10-05 – 2017-10-09 (×6): 25 mg via ORAL
  Filled 2017-10-05 (×6): qty 1
  Filled 2017-10-05: qty 10

## 2017-10-05 MED ORDER — MAGNESIUM HYDROXIDE 400 MG/5ML PO SUSP: 30.0000 mL | Freq: Every day | ORAL | Status: DC | PRN

## 2017-10-05 MED ORDER — NAPROXEN 500 MG PO TABS
ORAL_TABLET | ORAL | Status: AC
  Filled 2017-10-05: qty 1

## 2017-10-05 MED ORDER — METHOCARBAMOL 500 MG PO TABS
ORAL_TABLET | ORAL | Status: AC
  Filled 2017-10-05: qty 1

## 2017-10-05 MED ORDER — ONDANSETRON 4 MG PO TBDP: 4.0000 mg | ORAL_TABLET | Freq: Four times a day (QID) | ORAL | Status: DC | PRN

## 2017-10-05 MED ORDER — ONDANSETRON HCL 4 MG PO TABS: 4.0000 mg | ORAL_TABLET | Freq: Four times a day (QID) | ORAL | 0 refills | Status: DC | PRN

## 2017-10-05 MED ORDER — LORAZEPAM 1 MG PO TABS: 1.0000 mg | ORAL_TABLET | Freq: Three times a day (TID) | ORAL | Status: DC

## 2017-10-05 MED ORDER — ADULT MULTIVITAMIN W/MINERALS CH
1.0000 | ORAL_TABLET | Freq: Every day | ORAL | Status: DC
  Administered 2017-10-06 – 2017-10-09 (×4): 1 via ORAL
  Filled 2017-10-05 (×6): qty 1

## 2017-10-05 MED ORDER — LORAZEPAM 1 MG PO TABS
1.0000 mg | ORAL_TABLET | Freq: Four times a day (QID) | ORAL | Status: DC
  Administered 2017-10-05 – 2017-10-06 (×3): 1 mg via ORAL
  Filled 2017-10-05 (×2): qty 1

## 2017-10-05 MED ORDER — ACETAMINOPHEN 325 MG PO TABS
650.0000 mg | ORAL_TABLET | Freq: Four times a day (QID) | ORAL | Status: DC | PRN
  Administered 2017-10-08 (×2): 650 mg via ORAL
  Filled 2017-10-05 (×2): qty 2

## 2017-10-05 MED ORDER — LORAZEPAM 1 MG PO TABS: 1.0000 mg | ORAL_TABLET | Freq: Every day | ORAL | Status: DC

## 2017-10-05 NOTE — Clinical Social Work Note (Signed)
Clinical Social Work Assessment  Patient Details  Name: Andrew Rivas MRN: [id number] Date of Birth: 1961-01-31  Date of referral:  10/05/17               Reason for consult:  Facility Placement                Permission sought to share information with:    Permission granted to share information::  No  Name::        Agency::     Relationship::     Contact Information:     Housing/Transportation Living arrangements for the past 2 months:  Single Family Home Source of Information:  Patient Patient Interpreter Needed:  None Criminal Activity/Legal Involvement Pertinent to Current Situation/Hospitalization:  No - Comment as needed Significant Relationships:  Siblings, Other Family Members Lives with:  Roommate Do you feel safe going back to the place where you live?  No Need for family participation in patient care:  Yes (Comment)  Care giving concerns:  Patient reports that there is a lot of drug and alcohol abuse in his home and it is not a good living environment for him. Patient also reports that he is currently unemployed.    Social Worker assessment / plan:  LCSW consulted for inpatient psych.   According to psych note patient was admitted to [facility] for OD on 5/10. Patient was dc'd from [facility] on 5/15 and readmitted to [facility] on 5/16 for OD. Patient is under IVC.  Patient reports that he feels he was released too early from [facility]. Patient states that he is not seen by psych or a therapist in the community and is not on meds. At time of assessment. Patient was attempting to reach his sister. Patient concerned about his nephew who also attempted suicide and he is unsure of his current condition.   Patient does not give LCSW permission to contact any supports.   PLAN: Inpatient psych at DC.     Employment status:  Unemployed Health and safety inspector:  Self Pay (Medicaid Pending) PT Recommendations:  No Follow Up Information / Referral to community resources:     Patient/Family's  Response to care:  Patient is thankful for LCSW visit.   Patient/Family's Understanding of and Emotional Response to Diagnosis, Current Treatment, and Prognosis:  Patient reports that he feels he was released to soon. Patient asked LCSW about longer programs. LCSW explained to patient that length of stay in facility is determined by the medical staff at the facility. Patient stated he understood.   Emotional Assessment Appearance:  Appears stated age Attitude/Demeanor/Rapport:    Affect (typically observed):  Accepting Orientation:  Oriented to Self, Oriented to Place, Oriented to  Time, Oriented to Situation Alcohol / Substance use:  Alcohol Use, Illicit Drugs Psych involvement (Current and /or in the community):  No (Comment)  Discharge Needs  Concerns to be addressed:  Substance Abuse Concerns, Mental Health Concerns Readmission within the last 30 days:  Yes Current discharge risk:  None Barriers to Discharge:  No Barriers Identified   Coralyn Helling, LCSW 10/05/2017, 3:33 PM

## 2017-10-05 NOTE — Tx Team (Signed)
Initial Treatment Plan 10/05/2017 10:57 PM [name] ZOX:[id number]    PATIENT STRESSORS: Financial difficulties Legal issue Medication change or noncompliance Substance abuse   PATIENT STRENGTHS: Ability for insight Average or above average intelligence Capable of independent living General fund of knowledge Supportive family/friends Work skills   PATIENT IDENTIFIED PROBLEMS: "Get help with drinking"  "get my head together so I can get my life straightened out"                   DISCHARGE CRITERIA:  Adequate post-discharge living arrangements Improved stabilization in mood, thinking, and/or behavior Reduction of life-threatening or endangering symptoms to within safe limits Verbal commitment to aftercare and medication compliance Withdrawal symptoms are absent or subacute and managed without 24-hour nursing intervention  PRELIMINARY DISCHARGE PLAN: Attend aftercare/continuing care group Attend 12-step recovery group Return to previous living arrangement  PATIENT/FAMILY INVOLVEMENT: This treatment plan has been presented to and reviewed with the patient, TAICHI REPKA, and/or family member, .  The patient and family have been given the opportunity to ask questions and make suggestions.  Andrena Mews, RN 10/05/2017, 10:57 PM

## 2017-10-05 NOTE — Discharge Summary (Addendum)
Physician Discharge Summary  Andrew Rivas YQM:[id number] DOB: [birth year] DOA: 10/04/2017  PCP: Patient, No Pcp Per  Admit date: 10/04/2017 Discharge date: 10/05/2017  Admitted From: home  Disposition:  Behavioral health hospital   Recommendations for Outpatient Follow-up:  1. The patient is medically stable for discharge to [facility] 2. Repeat LFTs in 2-3 days  Discharge Condition:  stable   CODE STATUS:  Full code   Diet recommendation:  Regular diet Consultations:  psych    Discharge Diagnoses:  Principal Problem:   Suicide attempt [facility]) Active Problems:   Alcohol abuse   Overdose, undetermined intent, initial encounter   Transaminitis   OD (overdose of drug)    Subjective: He has no complaints for me this AM.  Brief Summary:  Andrew Rivas a 57 y/o with alcohol abuse and depression who was released from [facility] on 5/15 and came back to the ED the same night. He tells me that he went to a bar and started to drink and talk to someone there. He then took his meds which he had with him in his bag. A while later he decided to take more and continued to drink. He tells me that he had suicidal thoughts but also states the Trazodone made him feel better which is why he took more. He did take repeated doses of all of his meds taking 2-3 tab at at time (Zoloft, Neurontin, Trazodone and Atarax) but does not know how many meds he took.  After the left the bar around midnight, he had to stop at a [facility] to use the bathroom. He was leaving but then felt very light headed and off balance and asked to be taken to the ED.  His LFTs were noted to be elevated and poison control recommended an acetylcysteine drip. He did not admit to taking APAP and his level was not elevated  Hospital Course:  Overdose, suicidal intentions  - hold all home meds- EGK / tele shows NSR- continues to have no abnormalities on telemetry as of today - per, psych eval he may have tried to attempt suicide in  order to miss a court date- recommending inpatient psych admission once again- patient is agreeable and does not need involuntary commitment   Alcohol abuse - no withdrawal symptoms as has he was just discharged from in [facility]    Transaminitis - possibly due to Trazodone- do not recommend that it be resumed in an alcoholic - LFTs were only mildly elevated and have been steadily improving- see labs below - Acetylcysteine infusion stopped yesterday  Headache -  Ibuprofen helping  Nausea - mild and possibly due to OD- has been eating and drinking normally  Discharge Exam: Vitals:   10/05/17 1134 10/05/17 1200  BP: 109/75 102/74  Pulse: 67 66  Resp: 16 12  Temp:    SpO2: 97% 97%   Vitals:   10/05/17 0900 10/05/17 1000 10/05/17 1134 10/05/17 1200  BP: 116/80 120/78 109/75 102/74  Pulse: 75 77 67 66  Resp: Temp:      TempSrc:      SpO2: 97% 97% 97% 97%  Weight:      Height:        General: Pt is alert, awake, not in acute distress Cardiovascular: RRR, S1/S2 +, no rubs, no gallops Respiratory: CTA bilaterally, no wheezing, no rhonchi Abdominal: Soft, NT, ND, bowel sounds + Extremities: no edema, no cyanosis   Discharge Instructions  Discharge Instructions  Increase activity slowly   Complete by:  As directed      Allergies as of 10/05/2017   No Known Allergies     Medication List    STOP taking these medications   gabapentin 300 MG capsule Commonly known as:  NEURONTIN   hydrOXYzine 25 MG tablet Commonly known as:  ATARAX/VISTARIL   sertraline 50 MG tablet Commonly known as:  ZOLOFT   traZODone 50 MG tablet Commonly known as:  DESYREL     TAKE these medications   ibuprofen 400 MG tablet Commonly known as:  ADVIL,MOTRIN Take 1 tablet (400 mg total) by mouth every 4 (four) hours as needed for fever, mild pain or moderate pain.   multivitamin with minerals Tabs tablet Take 1 tablet by mouth daily. Start taking on:  10/06/2017    ondansetron 4 MG tablet Commonly known as:  ZOFRAN Take 1 tablet (4 mg total) by mouth every 6 (six) hours as needed for nausea.       No Known Allergies   Procedures/Studies:    Ct Head Wo Contrast  Result Date: 09/23/2017 CLINICAL DATA:  Seizure activity. EXAM: CT HEAD WITHOUT CONTRAST TECHNIQUE: Contiguous axial images were obtained from the base of the skull through the vertex without intravenous contrast. COMPARISON:  12/02/2012 head CT FINDINGS: Brain: No evidence of parenchymal hemorrhage or extra-axial fluid collection. No mass lesion, mass effect, or midline shift. No CT evidence of acute infarction. Cerebral volume is age appropriate. No ventriculomegaly. Vascular: No acute abnormality. Skull: No evidence of calvarial fracture. Sinuses/Orbits: Opacification of the asymmetrically small left frontal sinus. No fluid levels. Prominent chronic hyperostosis in the left frontal and left maxillary sinus walls. Other:  The mastoid air cells are unopacified. IMPRESSION: 1.  No evidence of acute intracranial abnormality. 2. Sequela of chronic left paranasal sinusitis. Electronically Signed   By: Delbert Phenix M.D.   On: 09/23/2017 09:29   US Abdomen Limited Ruq  Result Date: 10/04/2017 CLINICAL DATA:  Acute onset of transaminitis.  Drug overdose. EXAM: ULTRASOUND ABDOMEN LIMITED RIGHT UPPER QUADRANT COMPARISON:  None. FINDINGS: Gallbladder: No gallstones or wall thickening visualized. No sonographic Murphy sign noted by sonographer. Common bile duct: Diameter: 0.6 cm, within normal limits in caliber. Liver: No focal lesion identified. Within normal limits in parenchymal echogenicity. Portal vein is patent on color Doppler imaging with normal direction of blood flow towards the liver. IMPRESSION: Unremarkable ultrasound of the right upper quadrant. Electronically Signed   By: Roanna Raider M.D.   On: 10/04/2017 06:24     The results of significant diagnostics from this hospitalization (including  imaging, microbiology, ancillary and laboratory) are listed below for reference.     Microbiology: Recent Results (from the past 240 hour(s))  MRSA PCR Screening     Status: None   Collection Time: 10/04/17  5:36 PM  Result Value Ref Range Status   MRSA by PCR NEGATIVE NEGATIVE Final    Comment:        The GeneXpert MRSA Assay (FDA approved for NASAL specimens only), is one component of a comprehensive MRSA colonization surveillance program. It is not intended to diagnose MRSA infection nor to guide or monitor treatment for MRSA infections. Performed at [facility], [address] [place], [place] [address]      Labs: BNP (last 3 results) No results for input(s): BNP in the last 8760 hours. Basic Metabolic Panel: Recent Labs  Lab 10/04/17 0221 10/04/17 0800 10/05/17 0333  NA 139 136 139  K 4.1 4.3 4.0  CL 102 102 104  CO2 GLUCOSE 92 101* 93  BUN CREATININE 0.75 0.58* 0.72  CALCIUM 8.6* 8.0* 8.4*   Liver Function Tests: Recent Labs  Lab 10/04/17 0221 10/04/17 0800 10/05/17 0333  AST 182* 126* 89*  ALT 231* 201* 159*  ALKPHOS 94 81 90  BILITOT 0.4 0.2* 0.4  PROT 6.7 6.2* 6.1*  ALBUMIN 3.5 3.2* 3.1*   No results for input(s): LIPASE, AMYLASE in the last 168 hours. No results for input(s): AMMONIA in the last 168 hours. CBC: Recent Labs  Lab 10/04/17 0221  WBC 5.5  NEUTROABS 2.9  HGB 13.0  HCT 39.0  MCV 90.9  PLT 156   Cardiac Enzymes: Recent Labs  Lab 09/30/17 0630  CKTOTAL 81   BNP: Invalid input(s): POCBNP CBG: Recent Labs  Lab 10/04/17 0220  GLUCAP 87   D-Dimer No results for input(s): DDIMER in the last 72 hours. Hgb A1c No results for input(s): HGBA1C in the last 72 hours. Lipid Profile No results for input(s): CHOL, HDL, LDLCALC, TRIG, CHOLHDL, LDLDIRECT in the last 72 hours. Thyroid function studies No results for input(s): TSH, T4TOTAL, T3FREE, THYROIDAB in the last 72  hours.  Invalid input(s): FREET3 Anemia work up No results for input(s): VITAMINB12, FOLATE, FERRITIN, TIBC, IRON, RETICCTPCT in the last 72 hours. Urinalysis    Component Value Date/Time   COLORURINE YELLOW 09/26/2017 1911   APPEARANCEUR HAZY (A) 09/26/2017 1911   LABSPEC 1.011 09/26/2017 1911   PHURINE 5.0 09/26/2017 1911   GLUCOSEU NEGATIVE 09/26/2017 1911   HGBUR NEGATIVE 09/26/2017 1911   BILIRUBINUR NEGATIVE 09/26/2017 1911   KETONESUR 5 (A) 09/26/2017 1911   PROTEINUR NEGATIVE 09/26/2017 1911   NITRITE NEGATIVE 09/26/2017 1911   LEUKOCYTESUR NEGATIVE 09/26/2017 1911   Sepsis Labs Invalid input(s): PROCALCITONIN,  WBC,  LACTICIDVEN Microbiology Recent Results (from the past 240 hour(s))  MRSA PCR Screening     Status: None   Collection Time: 10/04/17  5:36 PM  Result Value Ref Range Status   MRSA by PCR NEGATIVE NEGATIVE Final    Comment:        The GeneXpert MRSA Assay (FDA approved for NASAL specimens only), is one component of a comprehensive MRSA colonization surveillance program. It is not intended to diagnose MRSA infection nor to guide or monitor treatment for MRSA infections. Performed at [facility], [address], [place], [place] [address]      Time coordinating discharge in minutes: 55  SIGNED:   Calvert Cantor, MD  [facility] 10/05/2017, 12:41 PM Pager   If 7PM-7AM, please contact night-coverage [email or web address] Password [email or web address]

## 2017-10-05 NOTE — Progress Notes (Signed)
CALL FROM [facility]; TRANSPORT [facility] CALLED. NO DISTRESS NOTED; PATIENT NOTIFIED.

## 2017-10-05 NOTE — Progress Notes (Signed)
SBAR REPORT GIVEN TO PATTY DUKE, RN [facility]) WITH ALLOWANCE FOR ALL QUESTIONS TO BE ASKED AND ANSWERED.  [phone]

## 2017-10-05 NOTE — Progress Notes (Signed)
Pt is a 57 year old male admitted with depression and chemical dependency    He uses ETOH and oxycontin   He was just here last week    Pt was suicidal and broke a window and laid down in the glass hoping to get cut and bleed to death   He said he is hopeless and helpless to stop drinking and tired of living that way   He said he drank a bunch of ETOH and was popping oxycontins and his other pills but he had most of the pill supply he was given on discharge in his belongings   Seems he is a poor historian  As the stories he has given other healthcare workers is different from what he is telling this Clinical research associate   He said he isnt homeless but he doesn't have a place to go  He did say he wants help to quit drinking nd living the way he has been living   Pt was cooperative during the assessment and was reoriented to the unit and offered nourishment    Verbal support given   Medications administered and effectiveness monitored    Q 15 min checks   Pt adjusting well he took his medications and went to bed

## 2017-10-05 NOTE — Progress Notes (Signed)
CSW called pt's RN to verify it was okay to call [facility] to transport the pt and pt's RN said this is okay.  CSW called non-emergency dispatch and was told [facility] is en route.    RN updated.  Please reconsult if future social work needs arise.  CSW signing off, as social work intervention is no longer needed.  Dorothe Pea. Chaniyah Jahr, LCSW, LCAS, CSI Clinical Social Worker Ph: [phone]

## 2017-10-05 NOTE — Progress Notes (Signed)
LCSW following for inpatient psych placement.   [facility]. Attending: Sharma Covert Accepting: Cobos RN report # [phone] Please call report before patient leaves the hospital.   Patient is IVC and will transport by [facility]. Patient can transport at 5:30.  LCSW will leave hand off for night ED CSW to set up transport this afternoon.   Beulah Gandy Bennett Long CSW [phone]

## 2017-10-06 DIAGNOSIS — F1910 Other psychoactive substance abuse, uncomplicated: Secondary | ICD-10-CM

## 2017-10-06 DIAGNOSIS — F1010 Alcohol abuse, uncomplicated: Secondary | ICD-10-CM

## 2017-10-06 DIAGNOSIS — F322 Major depressive disorder, single episode, severe without psychotic features: Secondary | ICD-10-CM

## 2017-10-06 DIAGNOSIS — Z79899 Other long term (current) drug therapy: Secondary | ICD-10-CM

## 2017-10-06 DIAGNOSIS — F17210 Nicotine dependence, cigarettes, uncomplicated: Secondary | ICD-10-CM

## 2017-10-06 MED ORDER — ACAMPROSATE CALCIUM 333 MG PO TBEC
666.0000 mg | DELAYED_RELEASE_TABLET | Freq: Three times a day (TID) | ORAL | Status: DC
  Administered 2017-10-06 – 2017-10-09 (×10): 666 mg via ORAL
  Filled 2017-10-06 (×3): qty 2
  Filled 2017-10-06: qty 42
  Filled 2017-10-06 (×2): qty 2
  Filled 2017-10-06: qty 42
  Filled 2017-10-06 (×3): qty 2
  Filled 2017-10-06: qty 42
  Filled 2017-10-06 (×4): qty 2
  Filled 2017-10-06: qty 42
  Filled 2017-10-06: qty 2

## 2017-10-06 MED ORDER — SERTRALINE HCL 50 MG PO TABS
50.0000 mg | ORAL_TABLET | Freq: Every day | ORAL | Status: DC
  Administered 2017-10-06 – 2017-10-09 (×4): 50 mg via ORAL
  Filled 2017-10-06 (×4): qty 1
  Filled 2017-10-06 (×2): qty 7
  Filled 2017-10-06 (×2): qty 1

## 2017-10-06 NOTE — [facility] Group Notes (Signed)
LCSW Group Therapy Note  10/06/2017   10:00--11:00am   Type of Therapy and Topic:  Group Therapy: Anger Cues and Responses  Participation Level:  Active   Description of Group:   In this group, patients learned how to recognize the physical, cognitive, emotional, and behavioral responses they have to anger-provoking situations.  They identified a recent time they became angry and how they reacted.  They analyzed how their reaction was possibly beneficial and how it was possibly unhelpful.  The group discussed a variety of healthier coping skills that could help with such a situation in the future.  Deep breathing was practiced briefly.  Therapeutic Goals: 1. Patients will remember their last incident of anger and how they felt emotionally and physically, what their thoughts were at the time, and how they behaved. 2. Patients will identify how their behavior at that time worked for them, as well as how it worked against them. 3. Patients will explore possible new behaviors to use in future anger situations. 4. Patients will learn that anger itself is normal and cannot be eliminated, and that healthier reactions can assist with resolving conflict rather than worsening situations.  Summary of Patient Progress:  The patient shared that his most recent time of anger was when he was discharged from the hospital and walked 10-15 miles to get his scooter, only to find that the battery was dead.  He became very frustrated and stated he was humiliated that he could not fix it and that he did not have the money to fix it.  As a result, he went to a convenience store and bought beer, started drinking then ran into a friend and started using Oxycontin.  He was able to identify other responses which would have been healthier for him and which he plans to employ next time.  He was very supportive of other patients.  Therapeutic Modalities:   Cognitive Behavioral Therapy  Lynnell Chad
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
## 2017-10-06 NOTE — Progress Notes (Signed)
D   Pt is pleasant on approach and cooperative    He continues to endorse some suicidal thoughts without intent   He said he tries to keep busy doing things throughout the day today and that has helped him not ruminate about the thoughts as much  A   Verbal support and encouragement given   Medications administered and effectiveness monitored   Q 15 min checks  R   Pt does contract for safety and is presently safe

## 2017-10-06 NOTE — [facility] Suicide Risk Assessment (Signed)
[facility] INPATIENT:  Family/Significant Other Suicide Prevention Education  Suicide Prevention Education:  Patient Refusal for Family/Significant Other Suicide Prevention Education: The patient Andrew Rivas has refused to provide written consent for family/significant other to be provided Family/Significant Other Suicide Prevention Education during admission and/or prior to discharge.  Physician notified.  Suicide Prevention Education was reviewed thoroughly a few days ago with patient, including risk factors, warning signs, and what to do.  Mobile Crisis services were described and that telephone number pointed out, with encouragement to patient to put this number in personal cell phone.  Brochure was provided to patient to share with loved ones.  Patient acknowledged the ways in which they are at risk, and how working through each of their issues can gradually start to reduce their risk factors.  Patient was encouraged to think of the information in the context of people in their own lives.  Patient denied having access to weapons.  Patient endorsed a desire to live.   Carloyn Jaeger Grossman-Orr 10/06/2017, 5:22 PM
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
## 2017-10-06 NOTE — Progress Notes (Signed)
Patient did attend the evening speaker AA meeting.  

## 2017-10-06 NOTE — [facility] Group Notes (Signed)
Adult Psychoeducational Group Note  Date:  10/06/2017 Time:  9:15 AM  Group Topic/Focus:  Goals Group:   The focus of this group is to help patients establish daily goals to achieve during treatment and discuss how the patient can incorporate goal setting into their daily lives to aide in recovery.  Participation Level:  Active  Participation Quality:  Appropriate  Affect:  Appropriate  Cognitive:  Alert  Insight: Appropriate  Engagement in Group:  Engaged  Modes of Intervention:  Orientation  Additional Comments:  Pt attended and participated in orientation/goals group. Pt goal for today is to focus on his needs and figure out what he need to do to get better.  Dellia Nims 10/06/2017, 9:15 AM
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
## 2017-10-06 NOTE — Progress Notes (Signed)
D: Andrew Rivas denied SI in conversation and on his self inventory form. He also denied HI and AVH. He reported poor sleep, fair appetite low energy, and poor concentration. He rated his depression 9/10, hopelessness 8/10, and anxiety 8/10. He attended groups and went outside for recreation.   A: Meds given as ordered, including PRN naproxen for a headache with some relief. Q15 safety checks maintained. Support/encouragement offered.  R: Pt remains free from harm and continues with treatment. Will continue to monitor for needs/safety.

## 2017-10-06 NOTE — [facility] Group Notes (Signed)
Life SKills   Date:  10/06/2017  Time:  3:49 PM  Type of Therapy:  Nurse Education  /  The group focuses on teaching patients how to identify their needs and then how to develop the skills needed to get their needs met.  Participation Level:  Active  Participation Quality:  Attentive  Affect:  Appropriate  Cognitive:  Alert  Insight:  Appropriate  Engagement in Group:  Engaged  Modes of Intervention:  Education  Summary of Progress/Problems:  Andrew Rivas 10/06/2017, 3:49 PM

## 2017-10-07 DIAGNOSIS — F1590 Other stimulant use, unspecified, uncomplicated: Secondary | ICD-10-CM

## 2017-10-07 DIAGNOSIS — F1190 Opioid use, unspecified, uncomplicated: Secondary | ICD-10-CM

## 2017-10-07 DIAGNOSIS — F1290 Cannabis use, unspecified, uncomplicated: Secondary | ICD-10-CM

## 2017-10-07 DIAGNOSIS — F419 Anxiety disorder, unspecified: Secondary | ICD-10-CM

## 2017-10-07 DIAGNOSIS — G4700 Insomnia, unspecified: Secondary | ICD-10-CM

## 2017-10-07 DIAGNOSIS — F1490 Cocaine use, unspecified, uncomplicated: Secondary | ICD-10-CM

## 2017-10-07 DIAGNOSIS — Y908 Blood alcohol level of 240 mg/100 ml or more: Secondary | ICD-10-CM

## 2017-10-07 MED ORDER — TRAZODONE HCL 100 MG PO TABS
100.0000 mg | ORAL_TABLET | Freq: Every evening | ORAL | Status: DC | PRN
  Administered 2017-10-07 – 2017-10-08 (×2): 100 mg via ORAL
  Filled 2017-10-07: qty 7
  Filled 2017-10-07: qty 1

## 2017-10-07 NOTE — Progress Notes (Signed)
DAR NOTE: Patient presents with anxious affect and depressed mood. Pt stated his depression is still high, and still having effect from overdosing on oxycodone, complained of headache. denies, auditory and visual hallucinations.  Rates depression at 8, hopelessness at 8, and anxiety at 7.  Maintained on routine safety checks.  Medications given as prescribed.  Support and encouragement offered as needed.    States goal for today is " make it to my group meetings."   Patient observed socializing with peers in the dayroom.  Offered no complaint.

## 2017-10-07 NOTE — [facility] Group Notes (Signed)
[facility] LCSW Group Therapy Note  Date/Time:  10/07/2017  10:00AM-11:00AM  Type of Therapy and Topic:  Group Therapy:  Obstacles at Discharge  Participation Level:  Active   Description of Group: In this process group, members discussed their anticipated obstacles to wellness when they discharge.  We then listened to five different songs that dealt with addiction, depression, and anxiety.  The group discussed how they could relate to each song and how each could help them to stay focused on their wellness.    Therapeutic Goals: 1. Patients will think about and acknowledge the obstacles they think they will face at hospital discharge 2. Patients will be able to realize that they are not alone and others are actually facing similar obstacles 3. Patients will identify how music can help or harm their recovery efforts 4. Patients will explore the use of music as a coping skill  Summary of Patient Progress:  At the beginning of group, patient expressed that not having a scooter for transportation, not having insurance or income, and his alcohol and pill addiction are going to be the obstacles he faces when it is time to leave the hospital.  His participation in group included comments on how the different songs were inspirational.  He stated he is very interested in following up with counseling, medication, and meetings.  Therapeutic Modalities: Activity Processing   Ambrose Mantle, LCSW

## 2017-10-07 NOTE — Progress Notes (Signed)
Patient did attend the evening speaker AA meeting.  

## 2017-10-07 NOTE — [facility] Group Notes (Signed)
[facility] Group Notes:  (Nursing/MHT/Case Management/Adjunct)  Date:  10/07/2017  Time:  3:24 PM  Type of Therapy:  Psychoeducational Skills  Participation Level:  Active  Participation Quality:  Appropriate  Affect:  Appropriate  Cognitive:  Appropriate  Insight:  Appropriate  Engagement in Group:  Engaged  Modes of Intervention:  Problem-solving  Summary of Progress/Problems: The group discussed about anger manag  Bethann Punches 10/07/2017, 3:24 PM

## 2017-10-07 NOTE — Progress Notes (Signed)
Patient discharged on Wednesday to get to his court date on Thursday morning for his 5th DWI.  He had continued to stall throughout the week, wanting to be in the hospital until after his court date on Thursday.  Andrew Rivas left [facility] and went to drinking alcohol and took his medications from discharge the night before court.    Nanine Means, PMHNP
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
## 2017-10-08 DIAGNOSIS — F323 Major depressive disorder, single episode, severe with psychotic features: Secondary | ICD-10-CM

## 2017-10-08 DIAGNOSIS — R450 Nervousness: Secondary | ICD-10-CM

## 2017-10-08 NOTE — [facility] Group Notes (Signed)
Adult Psychoeducational Group Note  Date:  10/08/2017 Time:  8:50 PM  Group Topic/Focus:  Wrap-Up Group:   The focus of this group is to help patients review their daily goal of treatment and discuss progress on daily workbooks.  Participation Level:  Active  Participation Quality:  Appropriate and Attentive  Affect:  Appropriate  Cognitive:  Alert and Appropriate  Insight: Appropriate and Good  Engagement in Group:  Engaged  Modes of Intervention:  Discussion and Education  Additional Comments:  Pt attended and participated in wrap up group this evening. Pt had an "iffy" day and spoke with the counselor. Pt has been going to meetings and has been thinking about what has happen with them in the past. Pt wants to to not have suicidal thoughts  And to get their meds corrected.   Andrew Rivas 10/08/2017, 8:50 PM

## 2017-10-08 NOTE — Progress Notes (Signed)
DAR NOTE: Pt present with flat affect and depressed mood in the unit. Pt has been visible in the milieu interacting with peers and staff. Pt denies physical pain, took all his meds as scheduled. As per self inventory, pt had a poor night sleep, fair appetite, normal energy, and poor concentration. Pt rate depression at 8, hopeless ness at 7, and anxiety at 7. Pt's safety ensured with 15 minute and environmental checks. Pt currently denies SI/HI and A/V hallucinations. Pt verbally agrees to seek staff if SI/HI or A/VH occurs and to consult with staff before acting on these thoughts. Will continue POC.

## 2017-10-08 NOTE — Progress Notes (Signed)
D: Pt denies SI/HI/AV hallucinations. Pt is pleasant and cooperative. Pt continues to voice some depression. Patient is out of room and in milieu and interacting with peers and staff.  A: Pt was offered support and encouragement. Pt was given scheduled medications. Pt was encourage to attend groups. Q 15 minute checks were done for safety.  R:Pt attends groups and interacts well with peers and staff. Pt is taking medication. Pt has no complaints.Pt receptive to treatment and safety maintained on unit.
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
## 2017-10-08 NOTE — [facility] Group Notes (Signed)
LCSW Group Therapy Note   10/08/2017 1:15pm   Type of Therapy and Topic:  Group Therapy:  Overcoming Obstacles   Participation Level:  Active   Description of Group:    In this group patients will be encouraged to explore what they see as obstacles to their own wellness and recovery. They will be guided to discuss their thoughts, feelings, and behaviors related to these obstacles. The group will process together ways to cope with barriers, with attention given to specific choices patients can make. Each patient will be challenged to identify changes they are motivated to make in order to overcome their obstacles. This group will be process-oriented, with patients participating in exploration of their own experiences as well as giving and receiving support and challenge from other group members.   Therapeutic Goals: 1. Patient will identify personal and current obstacles as they relate to admission. 2. Patient will identify barriers that currently interfere with their wellness or overcoming obstacles.  3. Patient will identify feelings, thought process and behaviors related to these barriers. 4. Patient will identify two changes they are willing to make to overcome these obstacles:      Summary of Patient Progress   Andrew Rivas was attentive and engaged during today's processing group. He shared that his biggest obstacle involves maintaining a motivation to abstain from drugs and get back on the right path. "I screwed up when I left here last time, but I have a plan this time. I know what I need to do for myself." He reports feeling more hopeful about his future. Andrew Rivas continues to show progress in the group setting with improving insight.    Therapeutic Modalities:   Cognitive Behavioral Therapy Solution Focused Therapy Motivational Interviewing Relapse Prevention Therapy  Ledell Peoples Smart, LCSW 10/08/2017 11:06 AM

## 2017-10-08 NOTE — Plan of Care (Addendum)
D: Pt denies SI/HI/AV hallucinations. Patient goal for today was to attend all groups. A: Pt was offered support and encouragement. Pt was given scheduled medications. Pt was encourage to attend groups. Q 15 minute checks were done for safety.  R:Pt attends groups and interacts well with peers and staff. Pt is taking medication. Pt has no complaints.Pt receptive to treatment and safety maintained on unit.    Problem: Safety: Goal: Ability to remain free from injury will improve 10/08/2017 0140 by Curly Rim, RN Outcome: Progressing Note:  Patient denies SI and remains safe on unit. 5

## 2017-10-08 NOTE — Tx Team (Signed)
Interdisciplinary Treatment and Diagnostic Plan Update  10/08/2017 Time of Session: 0830AM Andrew Rivas MRN: [id number]  Principal Diagnosis: MDD (major depressive disorder), severe (HCC)  Secondary Diagnoses: Principal Problem:   MDD (major depressive disorder), severe (HCC) Active Problems:   Alcohol abuse   Current Medications:  Current Facility-Administered Medications  Medication Dose Route Frequency Provider Last Rate Last Dose  . acamprosate (CAMPRAL) tablet 666 mg  666 mg Oral TID WC Money, Gerlene Burdock, FNP   666 mg at 10/08/17 0618  . acetaminophen (TYLENOL) tablet 650 mg  650 mg Oral Q6H PRN Money, Gerlene Burdock, FNP      . alum & mag hydroxide-simeth (MAALOX/MYLANTA) 200-200-20 MG/5ML suspension 30 mL  30 mL Oral Q4H PRN Money, Gerlene Burdock, FNP      . dicyclomine (BENTYL) tablet 20 mg  20 mg Oral Q6H PRN Nira Conn A, NP      . hydrOXYzine (ATARAX/VISTARIL) tablet 25 mg  25 mg Oral TID PRN Money, Gerlene Burdock, FNP   25 mg at 10/07/17 2118  . loperamide (IMODIUM) capsule 2-4 mg  2-4 mg Oral PRN Nira Conn A, NP      . magnesium hydroxide (MILK OF MAGNESIA) suspension 30 mL  30 mL Oral Daily PRN Money, Gerlene Burdock, FNP      . methocarbamol (ROBAXIN) tablet 500 mg  500 mg Oral Q8H PRN Nira Conn A, NP   500 mg at 10/05/17 2118  . multivitamin with minerals tablet 1 tablet  1 tablet Oral Daily Nira Conn A, NP   1 tablet at 10/08/17 0741  . naproxen (NAPROSYN) tablet 500 mg  500 mg Oral BID PRN Nira Conn A, NP   500 mg at 10/06/17 2105  . nicotine polacrilex (NICORETTE) gum 2 mg  2 mg Oral PRN Nira Conn A, NP   2 mg at 10/05/17 2117  . ondansetron (ZOFRAN-ODT) disintegrating tablet 4 mg  4 mg Oral Q6H PRN Nira Conn A, NP      . sertraline (ZOLOFT) tablet 50 mg  50 mg Oral Daily Money, Travis B, FNP   50 mg at 10/08/17 0741  . thiamine (VITAMIN B-1) tablet 100 mg  100 mg Oral Daily Nira Conn A, NP   100 mg at 10/08/17 0741  . traZODone (DESYREL) tablet 100 mg  100 mg Oral  QHS PRN Money, Gerlene Burdock, FNP   100 mg at 10/07/17 2117   PTA Medications: Medications Prior to Admission  Medication Sig Dispense Refill Last Dose  . ibuprofen (ADVIL,MOTRIN) 400 MG tablet Take 1 tablet (400 mg total) by mouth every 4 (four) hours as needed for fever, mild pain or moderate pain. 30 tablet 0   . Multiple Vitamin (MULTIVITAMIN WITH MINERALS) TABS tablet Take 1 tablet by mouth daily.     . ondansetron (ZOFRAN) 4 MG tablet Take 1 tablet (4 mg total) by mouth every 6 (six) hours as needed for nausea. 20 tablet 0     Patient Stressors: Paediatric nurse issue Medication change or noncompliance Substance abuse  Patient Strengths: Ability for insight Average or above average intelligence Capable of independent living General fund of knowledge Supportive family/friends Work skills  Treatment Modalities: Medication Management, Group therapy, Case management,  1 to 1 session with clinician, Psychoeducation, Recreational therapy.   Physician Treatment Plan for Primary Diagnosis: MDD (major depressive disorder), severe (HCC) Long Term Goal(s): Improvement in symptoms so as ready for discharge Improvement in symptoms so as ready for discharge   Short Term Goals:  Ability to identify and develop effective coping behaviors will improve Compliance with prescribed medications will improve Ability to identify triggers associated with substance abuse/mental health issues will improve Ability to identify changes in lifestyle to reduce recurrence of condition will improve Ability to verbalize feelings will improve Ability to disclose and discuss suicidal ideas  Medication Management: Evaluate patient's response, side effects, and tolerance of medication regimen.  Therapeutic Interventions: 1 to 1 sessions, Unit Group sessions and Medication administration.  Evaluation of Outcomes: Progressing  Physician Treatment Plan for Secondary Diagnosis: Principal Problem:   MDD  (major depressive disorder), severe (HCC) Active Problems:   Alcohol abuse  Long Term Goal(s): Improvement in symptoms so as ready for discharge Improvement in symptoms so as ready for discharge   Short Term Goals: Ability to identify and develop effective coping behaviors will improve Compliance with prescribed medications will improve Ability to identify triggers associated with substance abuse/mental health issues will improve Ability to identify changes in lifestyle to reduce recurrence of condition will improve Ability to verbalize feelings will improve Ability to disclose and discuss suicidal ideas     Medication Management: Evaluate patient's response, side effects, and tolerance of medication regimen.  Therapeutic Interventions: 1 to 1 sessions, Unit Group sessions and Medication administration.  Evaluation of Outcomes: Progressing   RN Treatment Plan for Primary Diagnosis: MDD (major depressive disorder), severe (HCC) Long Term Goal(s): Knowledge of disease and therapeutic regimen to maintain health will improve  Short Term Goals: Ability to remain free from injury will improve, Ability to participate in decision making will improve and Ability to identify and develop effective coping behaviors will improve  Medication Management: RN will administer medications as ordered by provider, will assess and evaluate patient's response and provide education to patient for prescribed medication. RN will report any adverse and/or side effects to prescribing provider.  Therapeutic Interventions: 1 on 1 counseling sessions, Psychoeducation, Medication administration, Evaluate responses to treatment, Monitor vital signs and CBGs as ordered, Perform/monitor CIWA, COWS, AIMS and Fall Risk screenings as ordered, Perform wound care treatments as ordered.  Evaluation of Outcomes: Progressing   LCSW Treatment Plan for Primary Diagnosis: MDD (major depressive disorder), severe (HCC) Long Term  Goal(s): Safe transition to appropriate next level of care at discharge, Engage patient in therapeutic group addressing interpersonal concerns.  Short Term Goals: Engage patient in aftercare planning with referrals and resources, Facilitate patient progression through stages of change regarding substance use diagnoses and concerns and Identify triggers associated with mental health/substance abuse issues  Therapeutic Interventions: Assess for all discharge needs, 1 to 1 time with Social worker, Explore available resources and support systems, Assess for adequacy in community support network, Educate family and significant other(s) on suicide prevention, Complete Psychosocial Assessment, Interpersonal group therapy.  Evaluation of Outcomes: Progressing   Progress in Treatment: Attending groups: Yes. Participating in groups: Yes. Taking medication as prescribed: Yes. Toleration medication: Yes. Family/Significant other contact made: SPE completed with pt; pt declined to consent to collateral contacts. Patient understands diagnosis: Yes. Discussing patient identified problems/goals with staff: Yes. Medical problems stabilized or resolved: Yes. Denies suicidal/homicidal ideation: Yes. Issues/concerns per patient self-inventory: No. Other: n/a   New problem(s) identified: No, Describe:  n/a  New Short Term/Long Term Goal(s): detox, medication management for mood stabilization; elimination of SI thoughts; development of comprehensive mental wellness/sobriety plan.   Patient Goal: "To do better and stay clean."   Discharge Plan or Barriers: CSW assessing for appropriate referrals. Pt plan to follow-up at [facility] but  may be going to jail for 6months first at discharge. Pt missed court date and sentencing last week. [facility] pamphlet, Mobile Crisis information, and AA/NA information provided to patient for additional community support and resources.   Reason for Continuation of Hospitalization:  Anxiety Depression Medication stabilization Suicidal ideation Withdrawal symptoms  Estimated Length of Stay: Wed, 10/10/17  Attendees: Patient: Andrew Rivas 10/08/2017 10:09 AM  Physician: Dr. Jama Flavors MD; Dr. Altamese Maish Vaya MD 10/08/2017 10:09 AM  Nursing: Erskine Squibb RN; Doris RN 10/08/2017 10:09 AM  RN Care Manager:x 10/08/2017 10:09 AM  Social Worker: Chartered loss adjuster, LCSW 10/08/2017 10:09 AM  Recreational Therapist: x 10/08/2017 10:09 AM  Other: Armandina Stammer NP; Hillery Jacks NP 10/08/2017 10:09 AM  Other:  10/08/2017 10:09 AM  Other: 10/08/2017 10:09 AM    Scribe for Treatment Team: Ledell Peoples Smart, LCSW 10/08/2017 10:09 AM

## 2017-10-08 NOTE — Plan of Care (Signed)
  Problem: Medication: Goal: Compliance with prescribed medication regimen will improve Outcome: Progressing Note:  Patient is compliant with medication regimen.   Problem: Safety: Goal: Periods of time without injury will increase Outcome: Progressing Note:  Patient denies SI and remains safe on unit.

## 2017-10-09 DIAGNOSIS — F339 Major depressive disorder, recurrent, unspecified: Secondary | ICD-10-CM

## 2017-10-09 DIAGNOSIS — F1099 Alcohol use, unspecified with unspecified alcohol-induced disorder: Secondary | ICD-10-CM

## 2017-10-09 MED ORDER — HYDROXYZINE HCL 25 MG PO TABS: 25.0000 mg | ORAL_TABLET | Freq: Three times a day (TID) | ORAL | 0 refills | Status: DC | PRN

## 2017-10-09 MED ORDER — TRAZODONE HCL 100 MG PO TABS: 100.0000 mg | ORAL_TABLET | Freq: Every evening | ORAL | 0 refills | Status: DC | PRN

## 2017-10-09 MED ORDER — ACAMPROSATE CALCIUM 333 MG PO TBEC: 666.0000 mg | DELAYED_RELEASE_TABLET | Freq: Three times a day (TID) | ORAL | 0 refills | Status: DC

## 2017-10-09 MED ORDER — SERTRALINE HCL 50 MG PO TABS: 50.0000 mg | ORAL_TABLET | Freq: Every day | ORAL | 0 refills | Status: DC

## 2017-10-09 NOTE — Progress Notes (Signed)
  [facility] Adult Case Management Discharge Plan :  Will you be returning to the same living situation after discharge:  Yes,  return to tent; will likely turn himself in if he has bench warrant.  At discharge, do you have transportation home?: Yes,  bus Do you have the ability to pay for your medications: Yes,  mental health  Release of information consent forms completed and submitted to medical records by CSW.  Patient to Follow up at: Follow-up Information    [facility].   Why:  Please walk in within 3 days of discharge to be assessed for outpatient mental health services including: medication management and therapy/Substance Abuse Intensive Outpatient Program. Walk in times: Monday-Friday 11am-12:30pm. Thank you.  Contact information: [address] [place], [place] [address] Phone: [phone] Fax: [phone]          Next level of care provider has access to [facility] Link:no  Safety Planning and Suicide Prevention discussed: Yes,  SPE completed with pt; pt declined to consent to collateral contact. [facility] pamphlet and Mobile Crisis information provided to pt.   Have you used any form of tobacco in the last 30 days? (Cigarettes, Smokeless Tobacco, Cigars, and/or Pipes): Yes  Has patient been referred to the Quitline?: Patient refused referral  Patient has been referred for addiction treatment: Yes  [employer], LCSW 10/09/2017, 8:55 AM

## 2017-10-09 NOTE — Progress Notes (Signed)
D: Patient observed sitting in dayroom interactive with peers. Patient states he remains depressed but denies SI/HI/AVH. Patient's affect flat,  mood pleasant. Per self inventory and discussions with writer, rates depression at a 9/10, hopelessness at an 8/10 and anxiety at an 8/10. Rates sleep as fair, appetite as fair, energy as normal and concentration as good.  States goal for today is "talking in group, attend group and discuss my problems." Denies pain, physical complaints.   A: Medicated per orders, no prns requested or required. Level III obs in place for safety. Emotional support offered and self inventory reviewed. Encouraged completion of Suicide Safety Plan and programming participation. Discussed POC with MD, SW.    R: Patient verbalizes understanding of POC. Patient denies SI/HI/AVH and remains safe on level III obs. Will continue to monitor closely and make verbal contact frequently. Identified for discharge.
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
## 2017-10-09 NOTE — Progress Notes (Signed)
Patient verbalizes readiness for discharge. Follow up plan explained, AVS, transition record and SRA given along with prescriptions.  All belongings returned. Suicide Safety Plan completed, on chart and copy given to patient. Patient verbalizes understanding. Denies SI/HI and assures this Clinical research associate he will seek assistance should that change. Patient discharged ambulatory and in stable condition with bus passes and directions to bus stop.
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
## 2018-01-25 ENCOUNTER — Encounter (HOSPITAL_COMMUNITY): Payer: Self-pay

## 2018-01-25 ENCOUNTER — Emergency Department (HOSPITAL_COMMUNITY)
Admission: EM | Admit: 2018-01-25 | Discharge: 2018-01-25 | Disposition: A | Discharge status: Home / Self Care | Payer: Self-pay | Attending: Emergency Medicine | Admitting: Emergency Medicine

## 2018-01-25 DIAGNOSIS — R7401 Elevation of levels of liver transaminase levels: Secondary | ICD-10-CM

## 2018-01-25 DIAGNOSIS — R740 Nonspecific elevation of levels of transaminase and lactic acid dehydrogenase [LDH]: Secondary | ICD-10-CM | POA: Insufficient documentation

## 2018-01-25 DIAGNOSIS — F1110 Opioid abuse, uncomplicated: Secondary | ICD-10-CM

## 2018-01-25 DIAGNOSIS — T40604A Poisoning by unspecified narcotics, undetermined, initial encounter: Secondary | ICD-10-CM

## 2018-01-25 DIAGNOSIS — Z79899 Other long term (current) drug therapy: Secondary | ICD-10-CM | POA: Insufficient documentation

## 2018-01-25 DIAGNOSIS — F17210 Nicotine dependence, cigarettes, uncomplicated: Secondary | ICD-10-CM | POA: Insufficient documentation

## 2018-01-25 LAB — BASIC METABOLIC PANEL
Anion gap: 12 (ref 5–15)
BUN: 6 mg/dL (ref 6–20)
CO2: 19 mmol/L — AB (ref 22–32)
Calcium: 8.1 mg/dL — ABNORMAL LOW (ref 8.9–10.3)
Chloride: 107 mmol/L (ref 98–111)
Creatinine, Ser: 0.66 mg/dL (ref 0.61–1.24)
GFR calc Af Amer: >60 mL/min (ref >60)
GLUCOSE: 103 mg/dL — AB (ref 70–99)
POTASSIUM: 4 mmol/L (ref 3.5–5.1)
Sodium: 138 mmol/L (ref 135–145)

## 2018-01-25 LAB — CBC
HEMATOCRIT: 39.5 % (ref 39.0–52.0)
Hemoglobin: 12.9 g/dL — ABNORMAL LOW (ref 13.0–17.0)
MCH: 30.2 pg (ref 26.0–34.0)
MCHC: 32.7 g/dL (ref 30.0–36.0)
MCV: 92.5 fL (ref 78.0–100.0)
Platelets: 258 10*3/uL (ref 150–400)
RBC: 4.27 MIL/uL (ref 4.22–5.81)
RDW: 15.6 % — AB (ref 11.5–15.5)
WBC: 6.2 10*3/uL (ref 4.0–10.5)

## 2018-01-25 LAB — CBG MONITORING, ED: Glucose-Capillary: 103 mg/dL — ABNORMAL HIGH (ref 70–99)
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
## 2018-01-25 NOTE — ED Notes (Signed)
Patient able to ambulate and dress independently 
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
## 2018-01-25 NOTE — Discharge Instructions (Addendum)
Do not take any illicit substances including narcotics.  Please avoid alcohol.  Follow-up with your counselor.

## 2018-01-25 NOTE — ED Notes (Signed)
Patient given turkey sandwich and water

## 2018-02-13 ENCOUNTER — Emergency Department (HOSPITAL_COMMUNITY)
Admission: EM | Admit: 2018-02-13 | Discharge: 2018-02-14 | Disposition: A | Discharge status: Home / Self Care | Payer: Self-pay | Attending: Emergency Medicine | Admitting: Emergency Medicine

## 2018-02-13 ENCOUNTER — Emergency Department (HOSPITAL_COMMUNITY): Payer: Self-pay

## 2018-02-13 ENCOUNTER — Encounter (HOSPITAL_COMMUNITY): Payer: Self-pay

## 2018-02-13 ENCOUNTER — Other Ambulatory Visit: Payer: Self-pay

## 2018-02-13 DIAGNOSIS — Z79899 Other long term (current) drug therapy: Secondary | ICD-10-CM | POA: Insufficient documentation

## 2018-02-13 DIAGNOSIS — F1010 Alcohol abuse, uncomplicated: Secondary | ICD-10-CM | POA: Diagnosis present

## 2018-02-13 DIAGNOSIS — F1290 Cannabis use, unspecified, uncomplicated: Secondary | ICD-10-CM | POA: Insufficient documentation

## 2018-02-13 DIAGNOSIS — R45851 Suicidal ideations: Secondary | ICD-10-CM | POA: Insufficient documentation

## 2018-02-13 DIAGNOSIS — I9589 Other hypotension: Secondary | ICD-10-CM

## 2018-02-13 DIAGNOSIS — F10129 Alcohol abuse with intoxication, unspecified: Secondary | ICD-10-CM | POA: Diagnosis present

## 2018-02-13 DIAGNOSIS — F17210 Nicotine dependence, cigarettes, uncomplicated: Secondary | ICD-10-CM | POA: Insufficient documentation

## 2018-02-13 DIAGNOSIS — Y908 Blood alcohol level of 240 mg/100 ml or more: Secondary | ICD-10-CM | POA: Insufficient documentation

## 2018-02-13 DIAGNOSIS — F10120 Alcohol abuse with intoxication, uncomplicated: Secondary | ICD-10-CM | POA: Insufficient documentation

## 2018-02-13 DIAGNOSIS — I959 Hypotension, unspecified: Secondary | ICD-10-CM | POA: Insufficient documentation

## 2018-02-13 LAB — CBC WITH DIFFERENTIAL/PLATELET
Basophils Absolute: 0 10*3/uL (ref 0.0–0.1)
Basophils Relative: 0 %
EOS ABS: 0.1 10*3/uL (ref 0.0–0.7)
Eosinophils Relative: 2 %
HCT: 36.4 % — ABNORMAL LOW (ref 39.0–52.0)
Hemoglobin: 12.5 g/dL — ABNORMAL LOW (ref 13.0–17.0)
LYMPHS ABS: 2.7 10*3/uL (ref 0.7–4.0)
LYMPHS PCT: 55 %
MCH: 31 pg (ref 26.0–34.0)
MCHC: 34.3 g/dL (ref 30.0–36.0)
MCV: 90.3 fL (ref 78.0–100.0)
MONO ABS: 0.6 10*3/uL (ref 0.1–1.0)
Monocytes Relative: 11 %
Neutro Abs: 1.6 10*3/uL — ABNORMAL LOW (ref 1.7–7.7)
Neutrophils Relative %: 32 %
PLATELETS: 254 10*3/uL (ref 150–400)
RBC: 4.03 MIL/uL — ABNORMAL LOW (ref 4.22–5.81)
RDW: 15.5 % (ref 11.5–15.5)
WBC: 5 10*3/uL (ref 4.0–10.5)

## 2018-02-13 LAB — URINALYSIS, ROUTINE W REFLEX MICROSCOPIC
Bilirubin Urine: NEGATIVE
Glucose, UA: NEGATIVE mg/dL
Hgb urine dipstick: NEGATIVE
Ketones, ur: NEGATIVE mg/dL
Leukocytes, UA: NEGATIVE
Nitrite: NEGATIVE
PH: 6 (ref 5.0–8.0)
Protein, ur: NEGATIVE mg/dL
SPECIFIC GRAVITY, URINE: 1.002 — AB (ref 1.005–1.030)

## 2018-02-13 LAB — COMPREHENSIVE METABOLIC PANEL
ALT: 394 U/L — AB (ref 0–44)
ANION GAP: 9 (ref 5–15)
AST: 477 U/L — ABNORMAL HIGH (ref 15–41)
Albumin: 3.3 g/dL — ABNORMAL LOW (ref 3.5–5.0)
Alkaline Phosphatase: 75 U/L (ref 38–126)
BILIRUBIN TOTAL: 0.2 mg/dL — AB (ref 0.3–1.2)
BUN: 7 mg/dL (ref 6–20)
CALCIUM: 8 mg/dL — AB (ref 8.9–10.3)
CO2: 24 mmol/L (ref 22–32)
CREATININE: 0.63 mg/dL (ref 0.61–1.24)
Chloride: 106 mmol/L (ref 98–111)
GFR calc non Af Amer: >60 mL/min (ref >60)
GLUCOSE: 87 mg/dL (ref 70–99)
Potassium: 3.5 mmol/L (ref 3.5–5.1)
SODIUM: 139 mmol/L (ref 135–145)
TOTAL PROTEIN: 6.3 g/dL — AB (ref 6.5–8.1)

## 2018-02-13 LAB — I-STAT CHEM 8, ED
BUN: 3 mg/dL — AB (ref 6–20)
CHLORIDE: 103 mmol/L (ref 98–111)
CREATININE: 1.1 mg/dL (ref 0.61–1.24)
Calcium, Ion: 1.09 mmol/L — ABNORMAL LOW (ref 1.15–1.40)
GLUCOSE: 83 mg/dL (ref 70–99)
HCT: 37 % — ABNORMAL LOW (ref 39.0–52.0)
Hemoglobin: 12.6 g/dL — ABNORMAL LOW (ref 13.0–17.0)
Potassium: 3.5 mmol/L (ref 3.5–5.1)
Sodium: 137 mmol/L (ref 135–145)
TCO2: 23 mmol/L (ref 22–32)

## 2018-02-13 LAB — RAPID URINE DRUG SCREEN, HOSP PERFORMED
AMPHETAMINES: NOT DETECTED
Barbiturates: NOT DETECTED
Benzodiazepines: NOT DETECTED
COCAINE: NOT DETECTED
OPIATES: NOT DETECTED
TETRAHYDROCANNABINOL: NOT DETECTED

## 2018-02-13 LAB — ACETAMINOPHEN LEVEL: Acetaminophen (Tylenol), Serum: <10 ug/mL — ABNORMAL LOW (ref 10–30)

## 2018-02-13 LAB — SALICYLATE LEVEL

## 2018-02-13 LAB — PROTIME-INR
INR: 1.03
PROTHROMBIN TIME: 13.4 s (ref 11.4–15.2)

## 2018-02-13 LAB — ETHANOL: Alcohol, Ethyl (B): 320 mg/dL (ref <10)

## 2018-02-13 LAB — CBG MONITORING, ED: Glucose-Capillary: 78 mg/dL (ref 70–99)

## 2018-02-13 MED ORDER — LORAZEPAM 2 MG/ML IJ SOLN
0.0000 mg | Freq: Four times a day (QID) | INTRAMUSCULAR | Status: DC
  Administered 2018-02-13: 2 mg via INTRAVENOUS
  Filled 2018-02-13: qty 1

## 2018-02-13 MED ORDER — VITAMIN B-1 100 MG PO TABS
100.0000 mg | ORAL_TABLET | Freq: Every day | ORAL | Status: DC
  Administered 2018-02-14: 100 mg via ORAL
  Filled 2018-02-13: qty 1

## 2018-02-13 MED ORDER — LORAZEPAM 1 MG PO TABS: 0.0000 mg | ORAL_TABLET | Freq: Two times a day (BID) | ORAL | Status: DC

## 2018-02-13 MED ORDER — LORAZEPAM 2 MG/ML IJ SOLN: 0.0000 mg | Freq: Two times a day (BID) | INTRAMUSCULAR | Status: DC

## 2018-02-13 MED ORDER — SODIUM CHLORIDE 0.9 % IV SOLN: Freq: Once | INTRAVENOUS | Status: DC

## 2018-02-13 MED ORDER — LORAZEPAM 1 MG PO TABS
0.0000 mg | ORAL_TABLET | Freq: Four times a day (QID) | ORAL | Status: DC
  Administered 2018-02-14: 1 mg via ORAL
  Filled 2018-02-13: qty 1

## 2018-02-13 MED ORDER — SODIUM CHLORIDE 0.9 % IV BOLUS
1000.0000 mL | Freq: Once | INTRAVENOUS | Status: AC
  Administered 2018-02-13: 1000 mL via INTRAVENOUS

## 2018-02-13 MED ORDER — THIAMINE HCL 100 MG/ML IJ SOLN: 100.0000 mg | Freq: Every day | INTRAMUSCULAR | Status: DC

## 2018-02-13 MED ORDER — THIAMINE HCL 100 MG/ML IJ SOLN
Freq: Once | INTRAVENOUS | Status: AC
  Administered 2018-02-13: 17:00:00 via INTRAVENOUS
  Filled 2018-02-13: qty 1000

## 2018-02-13 NOTE — Progress Notes (Signed)
Per Nira Conn, NP pt meets criteria for inpt treatment. EDP Maczis, Elmer Sow, PA-C has been advised. TTS attempted to contact the pt's nurse but was placed on hold for several mintues. TTS to seek placement.    Princess Bruins, MSW, LCSW Therapeutic Triage Specialist  [phone]
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
## 2018-02-13 NOTE — ED Notes (Signed)
Date and time results received: 02/13/18 4:57 PM   Test: ETOH Critical Value: 320  Name of Provider Notified: Pfeiffer  Orders Received? Or Actions Taken?: aware

## 2018-02-13 NOTE — ED Notes (Signed)
Patient transported to X-ray 

## 2018-02-13 NOTE — ED Notes (Signed)
Date and time results received: 02/13/18 9:48 PM  (use smartphrase ".now" to insert current time)  Test: INR Critical Value: 4.10  Name of Provider Notified: Leary Roca, PA  Orders Received? Or Actions Taken?: Will continue to monitor
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
## 2018-02-14 DIAGNOSIS — Y908 Blood alcohol level of 240 mg/100 ml or more: Secondary | ICD-10-CM

## 2018-02-14 DIAGNOSIS — F419 Anxiety disorder, unspecified: Secondary | ICD-10-CM

## 2018-02-14 DIAGNOSIS — F17210 Nicotine dependence, cigarettes, uncomplicated: Secondary | ICD-10-CM

## 2018-02-14 DIAGNOSIS — F1010 Alcohol abuse, uncomplicated: Secondary | ICD-10-CM

## 2018-02-14 LAB — OSMOLALITY: OSMOLALITY: 350 mosm/kg — AB (ref 275–295)

## 2018-02-14 NOTE — BH Assessment (Signed)
[facility] Assessment Progress Note  Per Jacqueline Norman, DO, this pt does not require psychiatric hospitalization at this time.  Pt is to be discharged from [facility] with recommendation to follow up with [facility].  This has been included in pt's discharge instructions.  Pt would also benefit from seeing Peer Support Specialists; they will be asked to speak to pt.  Pt's nurse, Diane, has been notified.  Andrew Nishi, MA Triage Specialist [phone]     

## 2018-02-14 NOTE — ED Notes (Signed)
Bed: [facility] Expected date:  Expected time:  Means of arrival:  Comments: Hold for 24

## 2018-02-14 NOTE — ED Notes (Signed)
Patient is resting comfortably. 

## 2018-02-14 NOTE — Patient Outreach (Signed)
ED Peer Support Specialist Patient Intake (Complete at intake & 30-60 Day Follow-up)  Name: Andrew Rivas  MRN: [id number]  Age: 57 y.o.   Date of Admission: 02/14/2018  Intake: Initial Comments:      Primary Reason Admitted:  57 y.o. male who presents to the ED voluntarily. Pt reports he intentionally OD on oxycontin in a suicide attempt. Pt states this is his 3rd suicide attempt. Pt reports he has been feeling depressed and worthless. Pt lives with friends and does not have stable housing. Pt states he cannot recall all of the incidents of today due to the OD. 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 Pt has pending legal charges due to stealing alcohol and "drunk and disorderly conduct." Pt states he feels overwhelmed with his life and does not want to live anymore. Pt states he has been experiencing negative racing thoughts such as "you're no good." Pt states his negative self-talk started about a month ago. Pt states he has no support, no resources, and no help. Pt states he does not feel that he has anything to live for. Pt has a hx of multiple inpt hospitalizations c/o Major depressive disorder, recurrent episode.  Lab values: Alcohol/ETOH: (P) Negative Positive UDS? (P) No Amphetamines: (P) No Barbiturates: (P) No Benzodiazepines: (P) No Cocaine: (P) No Opiates: (P) No Cannabinoids: (P) No  Demographic information: Gender: (P) Male Ethnicity: (P) White Marital Status: (P) Single Insurance Status: (P) Uninsured/Self-pay Ecologist (Work Neurosurgeon, Physicist, medical, etc.: (P) No Lives with: (P) Friend/Rommate Living situation: (P) House/Apartment  Reported Patient History: Patient reported health conditions: (P) Bipolar disorder, Depression Patient aware of HIV and hepatitis status: (P) No  In past year, has patient visited ED for any reason? (P) No  Number of ED  visits:    Reason(s) for visit:    In past year, has patient been hospitalized for any reason? (P) No  Number of hospitalizations:    Reason(s) for hospitalization:    In past year, has patient been arrested? (P) Yes(drunking disruptive)  Number of arrests:    Reason(s) for arrest:    In past year, has patient been incarcerated? (P) No  Number of incarcerations:    Reason(s) for incarceration:    In past year, has patient received medication-assisted treatment? (P) No  In past year, patient received the following treatments:    In past year, has patient received any harm reduction services? (P) No  Did this include any of the following?    In past year, has patient received care from a mental health provider for diagnosis other than SUD? (P) No  In past year, is this first time patient has overdosed? (P) Yes(oxy)  Number of past overdoses:    In past year, is this first time patient has been hospitalized for an overdose? (P) No  Number of hospitalizations for overdose(s):    Is patient currently receiving treatment for a mental health diagnosis? (P) No  Patient reports experiencing difficulty participating in SUD treatment: (P) No    Most important reason(s) for this difficulty?    Has patient received prior services for treatment? (P) No  In past, patient has received services from following agencies:    Plan of Care:  Suggested follow up at these agencies/treatment centers:    Other information: [facility] met with Pt and was able to gain information to help understand what services Pt is  seeking. [facility]PSS was able to get a consent form signed to help send Pt information to [facility] facility for detox services. CPSS addressed the fact that Pt will have to discussed a few issues to help with the assessment process. CPSS is waiting to hear back from the facility.     Andrew Rivas Andrew Rivas, [place]  02/14/2018 12:26 PM

## 2018-02-14 NOTE — Discharge Instructions (Signed)
Your CT showed asymmetric prominence of the left adenoidal soft tissues likely representing lymphoid tissue. This may be reactive in etiology and has been seen dating back to 2014. Please follow up with ENT for this. I have given you a referral.    TO HELP YOU MAINTAIN A SOBER LIFESTYLE a substance abuse treatment program may be beneficial to you.  Contact [facility] at your earliest opportunity to ask about enrolling in their program:       [facility]      [address], [place] [address]      [phone]      New patients are seen at the walk-in clinic every Tuesday from 9:00 am - 12:00 pm
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
## 2018-02-14 NOTE — ED Notes (Signed)
Pt discharged home. Discharged instructions read to pt who verbalized understanding. All belongings returned to pt who signed for same. Denies SI/HI, is not delusional and not responding to internal stimuli. Escorted pt to the ED exit.    

## 2018-02-15 ENCOUNTER — Encounter (HOSPITAL_COMMUNITY): Payer: Self-pay

## 2018-02-15 ENCOUNTER — Emergency Department (HOSPITAL_COMMUNITY)
Admission: EM | Admit: 2018-02-15 | Discharge: 2018-02-15 | Disposition: A | Discharge status: Home / Self Care | Payer: Self-pay | Attending: Emergency Medicine | Admitting: Emergency Medicine

## 2018-02-15 DIAGNOSIS — F1290 Cannabis use, unspecified, uncomplicated: Secondary | ICD-10-CM | POA: Insufficient documentation

## 2018-02-15 DIAGNOSIS — F1010 Alcohol abuse, uncomplicated: Secondary | ICD-10-CM

## 2018-02-15 DIAGNOSIS — F319 Bipolar disorder, unspecified: Secondary | ICD-10-CM

## 2018-02-15 DIAGNOSIS — F17210 Nicotine dependence, cigarettes, uncomplicated: Secondary | ICD-10-CM | POA: Insufficient documentation
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
## 2018-02-15 NOTE — ED Notes (Signed)
Pt fnd standing in room and had removed C-collar and oxygen. Pt states that he wants to leave and not will not follow redirection commands to stay seated to prevent falling. Dr Jeraldine Loots made aware.

## 2018-02-15 NOTE — Discharge Instructions (Addendum)
Please use these resources to obtain help; either at [facility], or another behavioral health facility.
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
## 2018-02-17 ENCOUNTER — Emergency Department (HOSPITAL_COMMUNITY)
Admission: EM | Admit: 2018-02-17 | Discharge: 2018-02-18 | Disposition: A | Discharge status: Home / Self Care | Payer: Self-pay | Attending: Emergency Medicine | Admitting: Emergency Medicine

## 2018-02-17 ENCOUNTER — Encounter (HOSPITAL_COMMUNITY): Payer: Self-pay | Admitting: Emergency Medicine

## 2018-02-17 ENCOUNTER — Other Ambulatory Visit: Payer: Self-pay

## 2018-02-17 DIAGNOSIS — F17200 Nicotine dependence, unspecified, uncomplicated: Secondary | ICD-10-CM | POA: Insufficient documentation

## 2018-02-17 DIAGNOSIS — F10920 Alcohol use, unspecified with intoxication, uncomplicated: Secondary | ICD-10-CM

## 2018-02-17 DIAGNOSIS — F10120 Alcohol abuse with intoxication, uncomplicated: Secondary | ICD-10-CM | POA: Insufficient documentation

## 2018-02-17 DIAGNOSIS — F314 Bipolar disorder, current episode depressed, severe, without psychotic features: Secondary | ICD-10-CM | POA: Insufficient documentation

## 2018-02-17 DIAGNOSIS — R45851 Suicidal ideations: Secondary | ICD-10-CM | POA: Insufficient documentation

## 2018-02-17 LAB — CBC
HCT: 42.3 % (ref 39.0–52.0)
Hemoglobin: 14.6 g/dL (ref 13.0–17.0)
MCH: 30.6 pg (ref 26.0–34.0)
MCHC: 34.5 g/dL (ref 30.0–36.0)
MCV: 88.7 fL (ref 78.0–100.0)
PLATELETS: 466 10*3/uL — AB (ref 150–400)
RBC: 4.77 MIL/uL (ref 4.22–5.81)
RDW: 15.5 % (ref 11.5–15.5)
WBC: 7.6 10*3/uL (ref 4.0–10.5)

## 2018-02-17 LAB — COMPREHENSIVE METABOLIC PANEL
ALK PHOS: 102 U/L (ref 38–126)
ALT: 250 U/L — AB (ref 0–44)
ANION GAP: 17 — AB (ref 5–15)
AST: 148 U/L — ABNORMAL HIGH (ref 15–41)
Albumin: 4.3 g/dL (ref 3.5–5.0)
BUN: 13 mg/dL (ref 6–20)
CALCIUM: 9.1 mg/dL (ref 8.9–10.3)
CO2: 22 mmol/L (ref 22–32)
Chloride: 100 mmol/L (ref 98–111)
Creatinine, Ser: 0.85 mg/dL (ref 0.61–1.24)
Glucose, Bld: 65 mg/dL — ABNORMAL LOW (ref 70–99)
Potassium: 4.7 mmol/L (ref 3.5–5.1)
Sodium: 139 mmol/L (ref 135–145)
Total Bilirubin: 0.8 mg/dL (ref 0.3–1.2)
Total Protein: 7.7 g/dL (ref 6.5–8.1)

## 2018-02-17 LAB — RAPID URINE DRUG SCREEN, HOSP PERFORMED
Amphetamines: NOT DETECTED
Barbiturates: NOT DETECTED
Benzodiazepines: NOT DETECTED
COCAINE: NOT DETECTED
OPIATES: NOT DETECTED
Tetrahydrocannabinol: NOT DETECTED

## 2018-02-17 LAB — ACETAMINOPHEN LEVEL

## 2018-02-17 LAB — ETHANOL: ALCOHOL ETHYL (B): 265 mg/dL — AB (ref <10)

## 2018-02-17 LAB — SALICYLATE LEVEL

## 2018-02-17 MED ORDER — LORAZEPAM 2 MG/ML IJ SOLN: 0.0000 mg | Freq: Four times a day (QID) | INTRAMUSCULAR | Status: DC

## 2018-02-17 MED ORDER — LORAZEPAM 1 MG PO TABS: 0.0000 mg | ORAL_TABLET | Freq: Four times a day (QID) | ORAL | Status: DC

## 2018-02-17 MED ORDER — ONDANSETRON HCL 4 MG PO TABS: 4.0000 mg | ORAL_TABLET | Freq: Three times a day (TID) | ORAL | Status: DC | PRN

## 2018-02-17 MED ORDER — THIAMINE HCL 100 MG/ML IJ SOLN: 100.0000 mg | Freq: Every day | INTRAMUSCULAR | Status: DC

## 2018-02-17 MED ORDER — VITAMIN B-1 100 MG PO TABS
100.0000 mg | ORAL_TABLET | Freq: Every day | ORAL | Status: DC
  Administered 2018-02-18: 100 mg via ORAL
  Filled 2018-02-17: qty 1

## 2018-02-17 MED ORDER — LORAZEPAM 1 MG PO TABS: 0.0000 mg | ORAL_TABLET | Freq: Two times a day (BID) | ORAL | Status: DC

## 2018-02-17 MED ORDER — LORAZEPAM 2 MG/ML IJ SOLN: 0.0000 mg | Freq: Two times a day (BID) | INTRAMUSCULAR | Status: DC
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
## 2018-02-17 NOTE — ED Notes (Signed)
Pt ambulatory to desk asking for food
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
## 2018-02-17 NOTE — ED Triage Notes (Signed)
Pt reports having suicidal ideations with a plan of overdosing. Pt reports drinking 3 cases of beer today. Pt alert and oriented x 4 at this time.

## 2018-02-17 NOTE — ED Notes (Signed)
Bed: [facility] Expected date:  Expected time:  Means of arrival:  Comments: 

## 2018-02-17 NOTE — ED Triage Notes (Signed)
Per EMS- Patient reports that he drank 60 beers, took Oxycodone 4 tabs at 0900 today . Patient also c/o SI. Patient states he originally took the oxycodone to get high and is now feeling suicidal.

## 2018-02-17 NOTE — ED Notes (Addendum)
Pt seen walking out of department before full triage could be completed. Dr.Allen notified of patient leaving due to initial complaint. No further orders given.

## 2018-02-17 NOTE — ED Notes (Signed)
Pt walked back into triage room and reported he went to get some food.

## 2018-02-17 NOTE — ED Notes (Signed)
TTS screening being done at this time via Telepsych machine.

## 2018-02-18 NOTE — BH Assessment (Signed)
[facility] Assessment Progress Note  Per Jacqueline Norman, DO, this pt does not require psychiatric hospitalization at this time.  Pt is to be discharged from [facility] with referral information for area substance abuse treatment providers.  This has been included in pt's discharge instructions.  Pt would also benefit from seeing Peer Support Specialists; they will be asked to speak to pt.  Pt's nurse, Edie, has been notified.  Andrew Porcaro, MA Triage Specialist [phone]     

## 2018-02-18 NOTE — ED Notes (Signed)
Pt to room #39. Pt cooperative on approach. Reports SI over the past month. Endorsing AH. Blaming living situation/living arrangement on his use of drugs and alcohol. Encouragement and support provided. Special checks q 15 mins in place for safety, Video monitoring in place. Will continue to monitor.

## 2018-02-18 NOTE — ED Notes (Signed)
Pt belongings locked up in locker #39. Belongings: [employer], shirt, shoes, socks, camo wallet, misc amount of change, [place] ID, debit card, EBT card, lighter, social security card. Belongings itemized by this Clinical research associate on belongings sheet located in pts chart at nurses station. Belongings sheet signed by pt acknowledging his understanding that belongings will remain locked up until discharge.

## 2018-02-18 NOTE — ED Notes (Signed)
Bed: [facility]34 Expected date:  Expected time:  Means of arrival:  Comments: TR 3 

## 2018-02-18 NOTE — ED Notes (Signed)
Pt d/c home per MD order. Discharge summary reviewed with pt. Pt verbalizes understanding. Pt denies SI/HI/AVH. Personal property returned to pt. Pt signed e-signature. Bus pass provided per pt request. Ambulatory off unit with [facility].  

## 2018-02-18 NOTE — Discharge Instructions (Signed)
To help you maintain a sober lifestyle, a substance abuse treatment program may be beneficial to you.  Contact one of the following facilities at your earliest opportunity to ask about enrolling: ° °RESIDENTIAL PROGRAMS: ° °     [facility] °     [address] °     [place], [place] [address] °     [phone] ° °     [facility] °     [address] °     [place], [place] [address] °     [phone] ° °     [facility] °     [address] °     [place], [place] [address] °     [phone] ° °OUTPATIENT PROGRAMS: ° °     [facility] °     [address] °     [place], [place] [address] °     [phone] °     New patients are seen at the walk-in clinic every Tuesday from 9:00 am - 12:00 pm °

## 2018-12-02 ENCOUNTER — Emergency Department (HOSPITAL_COMMUNITY)
Admission: EM | Admit: 2018-12-02 | Discharge: 2018-12-03 | Disposition: A | Discharge status: Home / Self Care | Payer: Self-pay | Attending: Emergency Medicine | Admitting: Emergency Medicine

## 2018-12-02 ENCOUNTER — Other Ambulatory Visit: Payer: Self-pay

## 2018-12-02 ENCOUNTER — Encounter (HOSPITAL_COMMUNITY): Payer: Self-pay | Admitting: Emergency Medicine

## 2018-12-02 DIAGNOSIS — Y908 Blood alcohol level of 240 mg/100 ml or more: Secondary | ICD-10-CM | POA: Insufficient documentation

## 2018-12-02 DIAGNOSIS — F10920 Alcohol use, unspecified with intoxication, uncomplicated: Secondary | ICD-10-CM

## 2018-12-02 DIAGNOSIS — F1210 Cannabis abuse, uncomplicated: Secondary | ICD-10-CM | POA: Insufficient documentation

## 2018-12-02 DIAGNOSIS — F10120 Alcohol abuse with intoxication, uncomplicated: Secondary | ICD-10-CM | POA: Insufficient documentation

## 2018-12-02 DIAGNOSIS — F17210 Nicotine dependence, cigarettes, uncomplicated: Secondary | ICD-10-CM | POA: Insufficient documentation

## 2018-12-02 LAB — CBC WITH DIFFERENTIAL/PLATELET
Abs Immature Granulocytes: 0.01 10*3/uL (ref 0.00–0.07)
Basophils Absolute: 0 10*3/uL (ref 0.0–0.1)
Basophils Relative: 1 %
Eosinophils Absolute: 0.1 10*3/uL (ref 0.0–0.5)
Eosinophils Relative: 2 %
HCT: 39.5 % (ref 39.0–52.0)
Hemoglobin: 13.6 g/dL (ref 13.0–17.0)
Immature Granulocytes: 0 %
Lymphocytes Relative: 41 %
Lymphs Abs: 2.1 10*3/uL (ref 0.7–4.0)
MCH: 32.2 pg (ref 26.0–34.0)
MCHC: 34.4 g/dL (ref 30.0–36.0)
MCV: 93.4 fL (ref 80.0–100.0)
Monocytes Absolute: 0.6 10*3/uL (ref 0.1–1.0)
Monocytes Relative: 11 %
Neutro Abs: 2.3 10*3/uL (ref 1.7–7.7)
Neutrophils Relative %: 45 %
Platelets: 167 10*3/uL (ref 150–400)
RBC: 4.23 MIL/uL (ref 4.22–5.81)
RDW: 14.2 % (ref 11.5–15.5)
WBC: 5 10*3/uL (ref 4.0–10.5)
nRBC: 0 % (ref 0.0–0.2)

## 2018-12-02 LAB — BASIC METABOLIC PANEL
Anion gap: 12 (ref 5–15)
BUN: 7 mg/dL (ref 6–20)
CO2: 22 mmol/L (ref 22–32)
Calcium: 8.2 mg/dL — ABNORMAL LOW (ref 8.9–10.3)
Chloride: 103 mmol/L (ref 98–111)
Creatinine, Ser: 0.69 mg/dL (ref 0.61–1.24)
GFR calc Af Amer: >60 mL/min (ref >60)
GFR calc non Af Amer: >60 mL/min (ref >60)
Glucose, Bld: 85 mg/dL (ref 70–99)
Potassium: 3.8 mmol/L (ref 3.5–5.1)
Sodium: 137 mmol/L (ref 135–145)

## 2018-12-02 LAB — RAPID URINE DRUG SCREEN, HOSP PERFORMED
Amphetamines: NOT DETECTED
Barbiturates: NOT DETECTED
Benzodiazepines: NOT DETECTED
Cocaine: NOT DETECTED
Opiates: NOT DETECTED
Tetrahydrocannabinol: NOT DETECTED

## 2018-12-02 NOTE — Discharge Instructions (Signed)
Please return for any problem.  Follow-up with your care provider as instructed.  Drink alcohol in moderation.
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
## 2018-12-02 NOTE — ED Provider Notes (Signed)
Received patient at signout from Dr. Francia Greaves.  Refer to provider note for full history and physical examination.  Briefly patient is a 58 year old male presenting for evaluation of alcohol intoxication.  He was belligerent with EMS.  Elevated ethanol level of 436 but remainder of blood work is reassuring.  Patient likely stable for discharge once clinically sober. Physical Exam  BP 92/62   Pulse 78   Temp 98.3 F (36.8 C) (Oral)   Resp 15   Ht 6\' 1"  (1.854 m)   Wt 115.2 kg   SpO2 91%   BMI 33.51 kg/m   Physical Exam Vitals signs and nursing note reviewed.  Constitutional:      General: He is not in acute distress.    Appearance: He is well-developed.     Comments: Sleeping but easily arousable  HENT:     Head: Normocephalic and atraumatic.  Eyes:     General:        Right eye: No discharge.        Left eye: No discharge.     Conjunctiva/sclera: Conjunctivae normal.  Neck:     Vascular: No JVD.     Trachea: No tracheal deviation.  Cardiovascular:     Rate and Rhythm: Normal rate.  Pulmonary:     Effort: Pulmonary effort is normal.  Abdominal:     General: There is no distension.  Skin:    General: Skin is warm and dry.     Findings: No erythema.  Neurological:     Mental Status: He is alert.     Comments: Dysarthric speech.  Oriented to person and place but not time.  Follows commands without difficulty but cannot ambulate independently.  Psychiatric:        Behavior: Behavior normal.       MDM  9:34 PM Patient remains clinically intoxicated, unable to ambulate independently at this time.  To oncoming provider PA Joy.        Debroah Baller 12/02/18 2135    Valarie Merino, MD 12/19/18 1028

## 2018-12-02 NOTE — ED Triage Notes (Signed)
Pt BIB EMS from [place]. [employer] found pt staggering and helped pt to the ground. Pt has had slurred speech d/t intoxication. Pt denies drug use. Pt shows NAD and VS are all WNL.

## 2018-12-02 NOTE — ED Provider Notes (Signed)
Andrew Rivas is a 58 y.o. male, presenting to the ED with alcohol intoxication.  Original HPI from Dene Gentry, MD: "58 year old male with prior medical history as detailed below presents for evaluation of suspected alcohol intoxication.  Patient was found near the local [place].  Store Youth worker.  Law enforcement called EMS.  Patient appears to be clinically intoxicated.  He does report drinking alcohol today.  He is otherwise without significant complaint.  The history is provided by the patient, the EMS personnel and medical records.  Illness Location:  Reported alcohol intoxication Severity:  Moderate Onset quality:  Unable to specify Timing:  Unable to specify Progression:  Unchanged Chronicity:  New"  Past Medical History:  Diagnosis Date  . Alcohol abuse   . Medical history non-contributory     Physical Exam  BP 92/62   Pulse 78   Temp 98.3 F (36.8 C) (Oral)   Resp 15   Ht 6\' 1"  (1.854 m)   Wt 115.2 kg   SpO2 91%   BMI 33.51 kg/m   Physical Exam Vitals signs and nursing note reviewed.  Constitutional:      General: He is not in acute distress.    Appearance: He is well-developed. He is not diaphoretic.  HENT:     Head: Normocephalic and atraumatic.  Eyes:     Conjunctiva/sclera: Conjunctivae normal.  Neck:     Musculoskeletal: Neck supple.  Cardiovascular:     Rate and Rhythm: Normal rate and regular rhythm.  Pulmonary:     Effort: Pulmonary effort is normal.  Skin:    General: Skin is warm and dry.     Coloration: Skin is not pale.  Neurological:     Mental Status: He is alert and oriented to person, place, and time.  Psychiatric:        Behavior: Behavior normal.     ED Course/Procedures     Procedures    Abnormal Labs Reviewed  ETHANOL - Abnormal; Notable for the following components:      Result Value   Alcohol, Ethyl (B) 436 (*)    All other components within normal limits  BASIC METABOLIC PANEL - Abnormal;  Notable for the following components:   Calcium 8.2 (*)    All other components within normal limits   No results found.  MDM   Received patient care handoff report from [facility], PA-C. Plan: Recheck patient for clinical sobriety.  Ambulate.  Discharge.   I was able to reevaluate the patient during the time he was under my care.  He was easily arousable and readily followed commands.  He stated he thought he was still intoxicated.  He had no physical complaints.  Upon the end of my shift, he was still a bit unsteady on his feet.  I ordered IV fluids.  End of shift patient care handoff report given to Quincy Carnes, PA-C. Plan: Reevaluate patient for clinical sobriety.  Ambulate.  Discharge.  Vitals:   12/02/18 1730 12/02/18 1800 12/02/18 1830 12/02/18 1930  BP: 99/75 112/82 103/62 92/62  Pulse: 75 83 83 78  Resp: 15 17 16 15   Temp:      TempSrc:      SpO2: 93% 100% 94% 91%  Weight:      Height:          Lorayne Bender, PA-C 12/03/18 0148    Mesner, Corene Cornea, MD 12/03/18 [phone]

## 2018-12-03 LAB — ETHANOL: Alcohol, Ethyl (B): 436 mg/dL (ref <10)

## 2018-12-03 MED ORDER — SODIUM CHLORIDE 0.9 % IV BOLUS
500.0000 mL | Freq: Once | INTRAVENOUS | Status: AC
  Administered 2018-12-03: 500 mL via INTRAVENOUS

## 2019-01-17 ENCOUNTER — Encounter (HOSPITAL_COMMUNITY): Payer: Self-pay | Admitting: Emergency Medicine

## 2019-01-17 ENCOUNTER — Emergency Department (HOSPITAL_COMMUNITY)
Admission: EM | Admit: 2019-01-17 | Discharge: 2019-01-17 | Disposition: A | Discharge status: Home / Self Care | Payer: Self-pay | Attending: Emergency Medicine | Admitting: Emergency Medicine

## 2019-01-17 ENCOUNTER — Emergency Department (HOSPITAL_COMMUNITY): Payer: Self-pay

## 2019-01-17 ENCOUNTER — Other Ambulatory Visit: Payer: Self-pay

## 2019-01-17 DIAGNOSIS — R072 Precordial pain: Secondary | ICD-10-CM | POA: Insufficient documentation

## 2019-01-17 DIAGNOSIS — Y908 Blood alcohol level of 240 mg/100 ml or more: Secondary | ICD-10-CM | POA: Insufficient documentation

## 2019-01-17 DIAGNOSIS — F10920 Alcohol use, unspecified with intoxication, uncomplicated: Secondary | ICD-10-CM

## 2019-01-17 DIAGNOSIS — F1010 Alcohol abuse, uncomplicated: Secondary | ICD-10-CM | POA: Insufficient documentation

## 2019-01-17 LAB — CBC
HCT: 39 % (ref 39.0–52.0)
Hemoglobin: 13.5 g/dL (ref 13.0–17.0)
MCH: 31.8 pg (ref 26.0–34.0)
MCHC: 34.6 g/dL (ref 30.0–36.0)
MCV: 92 fL (ref 80.0–100.0)
Platelets: 194 10*3/uL (ref 150–400)
RBC: 4.24 MIL/uL (ref 4.22–5.81)
RDW: 13.2 % (ref 11.5–15.5)
WBC: 6.1 10*3/uL (ref 4.0–10.5)
nRBC: 0 % (ref 0.0–0.2)

## 2019-01-17 LAB — BASIC METABOLIC PANEL
Anion gap: 11 (ref 5–15)
BUN: 6 mg/dL (ref 6–20)
CO2: 22 mmol/L (ref 22–32)
Calcium: 8.3 mg/dL — ABNORMAL LOW (ref 8.9–10.3)
Chloride: 104 mmol/L (ref 98–111)
Creatinine, Ser: 0.68 mg/dL (ref 0.61–1.24)
GFR calc Af Amer: >60 mL/min (ref >60)
GFR calc non Af Amer: >60 mL/min (ref >60)
Glucose, Bld: 86 mg/dL (ref 70–99)
Potassium: 3.6 mmol/L (ref 3.5–5.1)
Sodium: 137 mmol/L (ref 135–145)

## 2019-01-17 LAB — RAPID URINE DRUG SCREEN, HOSP PERFORMED
Amphetamines: NOT DETECTED
Barbiturates: NOT DETECTED
Benzodiazepines: NOT DETECTED
Cocaine: NOT DETECTED
Opiates: NOT DETECTED
Tetrahydrocannabinol: NOT DETECTED

## 2019-01-17 LAB — ETHANOL: Alcohol, Ethyl (B): 386 mg/dL (ref <10)

## 2019-01-17 LAB — TROPONIN I (HIGH SENSITIVITY)
Troponin I (High Sensitivity): 3 ng/L (ref <18)
Troponin I (High Sensitivity): 3 ng/L (ref <18)

## 2019-01-17 NOTE — ED Triage Notes (Addendum)
Pt brought in by [facility] from [place]. Pt stated he "drank a lot of beer today." and that he was having chest pain across his chest. Pt A&O to self situation and place. Pt NSR on monitor

## 2019-01-17 NOTE — ED Notes (Signed)
Pt walking around room, assisted back to bed. Meal bag and drink given.

## 2019-01-17 NOTE — ED Notes (Signed)
Pt found standing at the foot of his stretcher, pulled off leads, BP cuff and pulse ox. Pt assisted back to bed an is now asleep, again

## 2019-01-17 NOTE — ED Notes (Signed)
Pt independently ambulatory to restroom.

## 2019-01-17 NOTE — ED Provider Notes (Signed)
[facility] Provider Note   CSN: [id number] Arrival date & time: 01/17/19  1434     History   Chief Complaint Chief Complaint  Patient presents with  . Chest Pain  . Altered Mental Status    HPI Andrew Rivas is a 58 y.o. male.     [place] with report that patient had 'drank a lot of beer today' and that had pain across chest all day. Symptoms acute onset, moderate, persistent, dull, non radiating, not pleuritic. No associated sob, nv or diaphoresis. No chest wall injury or strain. No cough or uri symptoms. No fever or chills. No leg pain or swelling. No fam hx premature cad.   The history is provided by the patient and the EMS personnel.  Chest Pain Associated symptoms: altered mental status   Associated symptoms: no abdominal pain, no back pain, no cough, no fever, no headache, no nausea, no shortness of breath and no vomiting   Altered Mental Status Presenting symptoms: no confusion   Associated symptoms: no abdominal pain, no fever, no headaches, no nausea, no rash and no vomiting     History reviewed. No pertinent past medical history.  There are no active problems to display for this patient.   History reviewed. No pertinent surgical history.      Home Medications    Prior to Admission medications   Not on File    Family History No family history on file.  Social History Social History   Tobacco Use  . Smoking status: Not on file  Substance Use Topics  . Alcohol use: Not on file  . Drug use: Not on file     Allergies   Patient has no allergy information on record.   Review of Systems Review of Systems  Constitutional: Negative for fever.  HENT: Negative for sore throat.   Eyes: Negative for visual disturbance.  Respiratory: Negative for cough and shortness of breath.   Cardiovascular: Positive for chest pain. Negative for leg swelling.  Gastrointestinal:  Negative for abdominal pain, nausea and vomiting.  Genitourinary: Negative for flank pain.  Musculoskeletal: Negative for back pain and neck pain.  Skin: Negative for rash.  Neurological: Negative for headaches.  Hematological: Does not bruise/bleed easily.  Psychiatric/Behavioral: Negative for confusion.     Physical Exam Updated Vital Signs BP 95/67   Pulse 85   Resp 19   SpO2 92%   Physical Exam Vitals signs and nursing note reviewed.  Constitutional:      Appearance: Normal appearance. He is well-developed.  HENT:     Head: Atraumatic.     Nose: Nose normal.     Mouth/Throat:     Mouth: Mucous membranes are moist.     Pharynx: Oropharynx is clear.  Eyes:     General: No scleral icterus.    Conjunctiva/sclera: Conjunctivae normal.     Pupils: Pupils are equal, round, and reactive to light.  Neck:     Musculoskeletal: Normal range of motion and neck supple. No neck rigidity.     Trachea: No tracheal deviation.  Cardiovascular:     Rate and Rhythm: Normal rate and regular rhythm.     Pulses: Normal pulses.     Heart sounds: Normal heart sounds. No murmur. No friction rub. No gallop.   Pulmonary:     Effort: Pulmonary effort is normal. No accessory muscle usage or respiratory distress.     Breath sounds: Normal breath sounds.  Chest:     Chest wall: Tenderness present.  Abdominal:     General: Bowel sounds are normal. There is no distension.     Palpations: Abdomen is soft.     Tenderness: There is no abdominal tenderness. There is no guarding.  Genitourinary:    Comments: No cva tenderness. Musculoskeletal:        General: No swelling or tenderness.     Right lower leg: No edema.     Left lower leg: No edema.  Skin:    General: Skin is warm and dry.     Findings: No rash.  Neurological:     Mental Status: He is alert.     Comments: Alert, speech clear. Steady gait.   Psychiatric:        Mood and Affect: Mood normal.      ED Treatments / Results  Labs  (all labs ordered are listed, but only abnormal results are displayed) Results for orders placed or performed during the hospital encounter of 01/17/19  Basic metabolic panel  Result Value Ref Range   Sodium 137 135 - 145 mmol/L   Potassium 3.6 3.5 - 5.1 mmol/L   Chloride 104 98 - 111 mmol/L   CO2 22 22 - 32 mmol/L   Glucose, Bld 86 70 - 99 mg/dL   BUN 6 6 - 20 mg/dL   Creatinine, Ser 6.62 0.61 - 1.24 mg/dL   Calcium 8.3 (L) 8.9 - 10.3 mg/dL   GFR calc non Af Amer >60 >60 mL/min   GFR calc Af Amer >60 >60 mL/min   Anion gap 11 5 - 15  CBC  Result Value Ref Range   WBC 6.1 4.0 - 10.5 K/uL   RBC 4.24 4.22 - 5.81 MIL/uL   Hemoglobin 13.5 13.0 - 17.0 g/dL   HCT 94.7 65.4 - 65.0 %   MCV 92.0 80.0 - 100.0 fL   MCH 31.8 26.0 - 34.0 pg   MCHC 34.6 30.0 - 36.0 g/dL   RDW 35.4 65.6 - 81.2 %   Platelets 194 150 - 400 K/uL   nRBC 0.0 0.0 - 0.2 %  Ethanol  Result Value Ref Range   Alcohol, Ethyl (B) 386 (HH) <10 mg/dL  Troponin I (High Sensitivity)  Result Value Ref Range   Troponin I (High Sensitivity) 3 <18 ng/L  Troponin I (High Sensitivity)  Result Value Ref Range   Troponin I (High Sensitivity) 3 <18 ng/L   Dg Chest 2 View  Result Date: 01/17/2019 CLINICAL DATA:  Chest pain. EXAM: CHEST - 2 VIEW COMPARISON:  No recent. FINDINGS: Mediastinum and hilar structures normal. Lungs are clear. Mild elevation left hemidiaphragm. Mild left apical pleural thickening noted consistent with scarring. No pleural effusion or pneumothorax. Heart size normal. Degenerative change thoracic spine. IMPRESSION: Mild left base subsegmental atelectasis and or scarring. Electronically Signed   By: Maisie Fus  Register   On: 01/17/2019 15:37    EKG EKG Interpretation  Date/Time:  Friday January 17 2019 14:57:09 EDT Ventricular Rate:  89 PR Interval:    QRS Duration: 98 QT Interval:  372 QTC Calculation: 453 R Axis:   77 Text Interpretation:  Sinus rhythm Nonspecific ST abnormality No previous tracing  Confirmed by Cathren Laine ([id number]) on 01/17/2019 3:32:53 PM   Radiology Dg Chest 2 View  Result Date: 01/17/2019 CLINICAL DATA:  Chest pain. EXAM: CHEST - 2 VIEW COMPARISON:  No recent. FINDINGS: Mediastinum and hilar structures normal. Lungs are clear. Mild elevation left hemidiaphragm. Mild left apical  pleural thickening noted consistent with scarring. No pleural effusion or pneumothorax. Heart size normal. Degenerative change thoracic spine. IMPRESSION: Mild left base subsegmental atelectasis and or scarring. Electronically Signed   By: Marcello Moores  Register   On: 01/17/2019 15:37    Procedures Procedures (including critical care time)  Medications Ordered in ED Medications - No data to display   Initial Impression / Assessment and Plan / ED Course  I have reviewed the triage vital signs and the nursing notes.  Pertinent labs & imaging results that were available during my care of the patient were reviewed by me and considered in my medical decision making (see chart for details).  Iv ns. Ecg. Cxr. Stat labs.   Reviewed nursing notes and prior charts for additional history.   Labs reviewed by me - etoh high. Initial trop normal.   Recheck, sleeping comfortably.  Delta trop pending.  Po fluids/meal.  Ambulate in hall.  Delta trop is normal.   Recheck pt, no chest pain or discomfort. Has eaten/drank and indicates feels fine. No tremor or shakes. Vital signs normal.  Ambulates w steady gait.   Pt currently appear stable for d/c.   rec etoh cessation, resource guide provided. Also rec pcp f/u.  Return precautions provided.     Final Clinical Impressions(s) / ED Diagnoses   Final diagnoses:  None    ED Discharge Orders    None       Lajean Saver, MD 01/17/19 2151

## 2019-01-17 NOTE — Discharge Instructions (Addendum)
It was our pleasure to provide your ER care today - we hope that you feel better.  Avoid any alcohol use - use resource guide provided for alcohol treatment options.   Also follow up with primary care doctor in the next 1-2 weeks.   Return to ER if worse, new symptoms, fevers, new or severe pain, recurrent or persistent chest pain, increased trouble breathing, or other concern.

## 2019-01-20 ENCOUNTER — Emergency Department (HOSPITAL_COMMUNITY)
Admission: EM | Admit: 2019-01-20 | Discharge: 2019-01-22 | Disposition: A | Discharge status: Other Acute Inpatient Hospital | Payer: Self-pay | Attending: Emergency Medicine | Admitting: Emergency Medicine

## 2019-01-20 ENCOUNTER — Other Ambulatory Visit: Payer: Self-pay

## 2019-01-20 ENCOUNTER — Encounter (HOSPITAL_COMMUNITY): Payer: Self-pay | Admitting: Emergency Medicine

## 2019-01-20 DIAGNOSIS — Y908 Blood alcohol level of 240 mg/100 ml or more: Secondary | ICD-10-CM | POA: Insufficient documentation

## 2019-01-20 DIAGNOSIS — Z20828 Contact with and (suspected) exposure to other viral communicable diseases: Secondary | ICD-10-CM | POA: Insufficient documentation

## 2019-01-20 DIAGNOSIS — T50902A Poisoning by unspecified drugs, medicaments and biological substances, intentional self-harm, initial encounter: Secondary | ICD-10-CM

## 2019-01-20 DIAGNOSIS — F10920 Alcohol use, unspecified with intoxication, uncomplicated: Secondary | ICD-10-CM | POA: Insufficient documentation

## 2019-01-20 DIAGNOSIS — F1290 Cannabis use, unspecified, uncomplicated: Secondary | ICD-10-CM | POA: Insufficient documentation

## 2019-01-20 DIAGNOSIS — F1910 Other psychoactive substance abuse, uncomplicated: Secondary | ICD-10-CM

## 2019-01-20 DIAGNOSIS — F329 Major depressive disorder, single episode, unspecified: Secondary | ICD-10-CM | POA: Insufficient documentation

## 2019-01-20 DIAGNOSIS — R45851 Suicidal ideations: Secondary | ICD-10-CM

## 2019-01-20 DIAGNOSIS — F17210 Nicotine dependence, cigarettes, uncomplicated: Secondary | ICD-10-CM | POA: Insufficient documentation

## 2019-01-20 LAB — CBC
HCT: 41.5 % (ref 39.0–52.0)
Hemoglobin: 14.2 g/dL (ref 13.0–17.0)
MCH: 31.5 pg (ref 26.0–34.0)
MCHC: 34.2 g/dL (ref 30.0–36.0)
MCV: 92 fL (ref 80.0–100.0)
Platelets: 224 10*3/uL (ref 150–400)
RBC: 4.51 MIL/uL (ref 4.22–5.81)
RDW: 13.5 % (ref 11.5–15.5)
WBC: 6.4 10*3/uL (ref 4.0–10.5)
nRBC: 0 % (ref 0.0–0.2)

## 2019-01-20 LAB — RAPID URINE DRUG SCREEN, HOSP PERFORMED
Amphetamines: NOT DETECTED
Barbiturates: NOT DETECTED
Benzodiazepines: NOT DETECTED
Cocaine: NOT DETECTED
Opiates: NOT DETECTED
Tetrahydrocannabinol: NOT DETECTED

## 2019-01-20 LAB — COMPREHENSIVE METABOLIC PANEL
ALT: 36 U/L (ref 0–44)
AST: 47 U/L — ABNORMAL HIGH (ref 15–41)
Albumin: 3.9 g/dL (ref 3.5–5.0)
Alkaline Phosphatase: 62 U/L (ref 38–126)
Anion gap: 13 (ref 5–15)
BUN: <5 mg/dL — ABNORMAL LOW (ref 6–20)
CO2: 23 mmol/L (ref 22–32)
Calcium: 8.4 mg/dL — ABNORMAL LOW (ref 8.9–10.3)
Chloride: 100 mmol/L (ref 98–111)
Creatinine, Ser: 0.75 mg/dL (ref 0.61–1.24)
GFR calc Af Amer: >60 mL/min (ref >60)
GFR calc non Af Amer: >60 mL/min (ref >60)
Glucose, Bld: 85 mg/dL (ref 70–99)
Potassium: 3.7 mmol/L (ref 3.5–5.1)
Sodium: 136 mmol/L (ref 135–145)
Total Bilirubin: 0.4 mg/dL (ref 0.3–1.2)
Total Protein: 6.8 g/dL (ref 6.5–8.1)

## 2019-01-20 LAB — URINALYSIS, ROUTINE W REFLEX MICROSCOPIC
Bilirubin Urine: NEGATIVE
Glucose, UA: NEGATIVE mg/dL
Hgb urine dipstick: NEGATIVE
Ketones, ur: NEGATIVE mg/dL
Leukocytes,Ua: NEGATIVE
Nitrite: NEGATIVE
Protein, ur: NEGATIVE mg/dL
Specific Gravity, Urine: 1.001 — ABNORMAL LOW (ref 1.005–1.030)
pH: 5 (ref 5.0–8.0)

## 2019-01-20 LAB — CBG MONITORING, ED: Glucose-Capillary: 80 mg/dL (ref 70–99)

## 2019-01-20 LAB — ACETAMINOPHEN LEVEL: Acetaminophen (Tylenol), Serum: <10 ug/mL — ABNORMAL LOW (ref 10–30)

## 2019-01-20 LAB — ETHANOL: Alcohol, Ethyl (B): 401 mg/dL (ref <10)

## 2019-01-20 LAB — SALICYLATE LEVEL: Salicylate Lvl: <7 mg/dL (ref 2.8–30.0)

## 2019-01-20 MED ORDER — LORAZEPAM 1 MG PO TABS
0.0000 mg | ORAL_TABLET | Freq: Four times a day (QID) | ORAL | Status: DC
  Administered 2019-01-21 (×2): 1 mg via ORAL
  Filled 2019-01-20 (×3): qty 1

## 2019-01-20 MED ORDER — VITAMIN B-1 100 MG PO TABS
100.0000 mg | ORAL_TABLET | Freq: Every day | ORAL | Status: DC
  Administered 2019-01-21 – 2019-01-22 (×2): 100 mg via ORAL
  Filled 2019-01-20 (×2): qty 1

## 2019-01-20 MED ORDER — LORAZEPAM 2 MG/ML IJ SOLN: 0.0000 mg | Freq: Four times a day (QID) | INTRAMUSCULAR | Status: DC

## 2019-01-20 MED ORDER — LORAZEPAM 2 MG/ML IJ SOLN: 0.0000 mg | Freq: Two times a day (BID) | INTRAMUSCULAR | Status: DC

## 2019-01-20 MED ORDER — THIAMINE HCL 100 MG/ML IJ SOLN
100.0000 mg | Freq: Every day | INTRAMUSCULAR | Status: DC
  Administered 2019-01-20: 21:00:00 100 mg via INTRAVENOUS
  Filled 2019-01-20: qty 2

## 2019-01-20 MED ORDER — LORAZEPAM 1 MG PO TABS: 0.0000 mg | ORAL_TABLET | Freq: Two times a day (BID) | ORAL | Status: DC

## 2019-01-20 NOTE — ED Triage Notes (Signed)
Patient was found passed out at the local [place], took unknown amount of oxycodone.  He did state to EMS that this was a suicide attempt.  Patient does have HA and nausea.  NSR on monitor.

## 2019-01-20 NOTE — ED Notes (Signed)
Pt changed into paper scrubs, belongings inventoried. Sandwich provided

## 2019-01-20 NOTE — ED Notes (Signed)
Pt wanded by security. 
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
## 2019-01-21 ENCOUNTER — Encounter (HOSPITAL_COMMUNITY): Payer: Self-pay | Admitting: Clinical

## 2019-01-21 LAB — SARS CORONAVIRUS 2 (TAT 6-24 HRS): SARS Coronavirus 2: NEGATIVE

## 2019-01-21 MED ORDER — FAMOTIDINE 20 MG PO TABS
20.0000 mg | ORAL_TABLET | Freq: Once | ORAL | Status: AC
  Administered 2019-01-21: 13:00:00 20 mg via ORAL
  Filled 2019-01-21: qty 1

## 2019-01-21 NOTE — BHH Counselor (Signed)
Disposition: Takia Starkes, PMHNP recommends in patient. TTS to seek placement. 

## 2019-01-21 NOTE — BH Assessment (Signed)
Tele Assessment Note   Patient Name: NEHEMIAS SAUCEDA MRN: [id number] Referring Physician: Vanita Panda Location of Patient: [facility] ED Location of Provider: [place] Department  DAYSON ABOUD is an 58 y.o. male presenting voluntarily to [facility] ED via EMS after being found passed out at a gas station. Patient reported to EMS he took and overdose of oxycodone in a suicide attempt. Additionally, patient's BAL is 401 upon arrival to ED. This clinician assessed patient the following morning after BAL had decreased. Upon exam patient is calm and cooperative. He states "I was hearing voices that that led to thoughts of not wanting to live. The voices were telling me to kill myself." Patient reports ingesting 12 of "some kind of pain pill." Patient's UDS is negative for opiates, however. Patient continues to endorse SI. He reports depressive symptoms of hopelessness, worthlessness, anhedonia, irritability, guilt, fatigue, and social isolation. Patient denies HI/VH. He states that he does not have any outpatient providers. Patient reports drinking 1 case of beer daily and occasionally using prescription pain pills. He denies ever participating in in patient or outpatient SA treatment. He denies any withdrawal symptoms at this time. Patient states he does not have any natural supports we could contact for collateral information.  Patient is alert and oriented x 4. He is dressed in scrubs, laying in hospital bed. His speech is soft/slow, eye contact is poor, and thoughts are organized. Patient's mood is depressed and affect is flat. Patient's insight, judgement, and impulse control are impaired. Patient does not appear to be responding to internal stimuli or experiencing delusional thought content.  Diagnosis: F33.3 MDD, recurrent, with psychotic features   F10.20 Alcohol use disorder, severe  Disposition: Priscille Loveless, PMHNP recommends in patient.  Past Medical History:  Past Medical History:   Diagnosis Date  . Alcohol abuse   . Medical history non-contributory     Past Surgical History:  Procedure Laterality Date  . HAND SURGERY Left   . NO PAST SURGERIES      Family History: History reviewed. No pertinent family history.  Social History:  reports that he has been smoking. He has been smoking about 1.00 pack per day. He has never used smokeless tobacco. He reports current alcohol use. He reports current drug use. Drugs: Marijuana and Oxycodone.  Additional Social History:  Alcohol / Drug Use Pain Medications: see MAR Prescriptions: see MAR Over the Counter: see MAR History of alcohol / drug use?: Yes Substance #1 Name of Substance 1: alcohol 1 - Age of First Use: uta 1 - Amount (size/oz): 1 case of beer 1 - Frequency: daily 1 - Duration: 1 year 1 - Last Use / Amount: 1 case 01/20/2019  CIWA: CIWA-Ar BP: 113/77 Pulse Rate: 67 Nausea and Vomiting: no nausea and no vomiting Tactile Disturbances: none Tremor: no tremor Auditory Disturbances: not present Paroxysmal Sweats: no sweat visible Visual Disturbances: very mild sensitivity Anxiety: mildly anxious Headache, Fullness in Head: none present Agitation: normal activity Orientation and Clouding of Sensorium: oriented and can do serial additions CIWA-Ar Total: 2 COWS: Clinical Opiate Withdrawal Scale (COWS) Resting Pulse Rate: Pulse Rate 81-100 Sweating: No report of chills or flushing Restlessness: Able to sit still Pupil Size: Pupils pinned or normal size for room light Bone or Joint Aches: Not present Runny Nose or Tearing: Not present GI Upset: Stomach cramps Tremor: No tremor Yawning: No yawning Anxiety or Irritability: None Gooseflesh Skin: Skin is smooth COWS Total Score: 2  Allergies: No Known Allergies  Home Medications: (  Not in a hospital admission)   OB/GYN Status:  No LMP for male patient.  General Assessment Data Assessment unable to be completed: Yes Reason for not completing  assessment: Per Gabriel Rung, RN pt is currently sleeping. Pt's BAL is 401 at 2104. RN to call clinician once pt on medically cleared, alert, oriented and able to engage in TTS assessment.  Location of Assessment: [facility] ED TTS Assessment: In system Is this a Tele or Face-to-Face Assessment?: Tele Assessment Is this an Initial Assessment or a Re-assessment for this encounter?: Initial Assessment Patient Accompanied by:: N/A Language Other than English: No Living Arrangements: Other (Comment)(with roommates) What gender do you identify as?: Male Marital status: Divorced Jordan name: Manzi Pregnancy Status: No Living Arrangements: Non-relatives/Friends Can pt return to current living arrangement?: Yes Admission Status: Voluntary Is patient capable of signing voluntary admission?: Yes Referral Source: Self/Family/Friend Insurance type: None     Crisis Care Plan Living Arrangements: Non-relatives/Friends Legal Guardian: (self) Name of Psychiatrist: none Name of Therapist: none  Education Status Is patient currently in school?: No Is the patient employed, unemployed or receiving disability?: Unemployed  Risk to self with the past 6 months Suicidal Ideation: Yes-Currently Present Has patient been a risk to self within the past 6 months prior to admission? : Yes Suicidal Intent: Yes-Currently Present Has patient had any suicidal intent within the past 6 months prior to admission? : Yes Is patient at risk for suicide?: Yes Suicidal Plan?: Yes-Currently Present Has patient had any suicidal plan within the past 6 months prior to admission? : Yes Specify Current Suicidal Plan: patient overdosed on 8/31 Access to Means: Yes Specify Access to Suicidal Means: access to drugs and alcohol What has been your use of drugs/alcohol within the last 12 months?: daily alcohol use, occasional opiate use Previous Attempts/Gestures: Yes How many times?: 2 Other Self Harm Risks: none noted Triggers for  Past Attempts: Hallucinations Intentional Self Injurious Behavior: None Family Suicide History: No Recent stressful life event(s): Legal Issues Persecutory voices/beliefs?: Yes Depression: Yes Depression Symptoms: Despondent, Insomnia, Tearfulness, Isolating, Fatigue, Guilt, Loss of interest in usual pleasures, Feeling worthless/self pity, Feeling angry/irritable Substance abuse history and/or treatment for substance abuse?: No Suicide prevention information given to non-admitted patients: Not applicable  Risk to Others within the past 6 months Homicidal Ideation: No Does patient have any lifetime risk of violence toward others beyond the six months prior to admission? : No Thoughts of Harm to Others: No Current Homicidal Intent: No Current Homicidal Plan: No Access to Homicidal Means: No Identified Victim: none History of harm to others?: No Assessment of Violence: None Noted Violent Behavior Description: none noted Does patient have access to weapons?: No Criminal Charges Pending?: Yes Describe Pending Criminal Charges: trespassing, open container Does patient have a court date: Yes Court Date: (unknown) Is patient on probation?: No  Psychosis Hallucinations: Auditory Delusions: None noted  Mental Status Report Appearance/Hygiene: Unremarkable Eye Contact: Poor Motor Activity: Freedom of movement Speech: Soft, Slow Level of Consciousness: Quiet/awake Mood: Depressed Affect: Flat Anxiety Level: None Thought Processes: Coherent, Relevant Judgement: Impaired Orientation: Person, Place, Time, Situation Obsessive Compulsive Thoughts/Behaviors: None  Cognitive Functioning Concentration: Fair Memory: Recent Impaired, Remote Intact Is patient IDD: No Insight: Poor Impulse Control: Poor Appetite: Poor Have you had any weight changes? : No Change Sleep: Decreased Total Hours of Sleep: (UTA) Vegetative Symptoms: None  ADLScreening [facility] Assessment Services) Patient's  cognitive ability adequate to safely complete daily activities?: Yes Patient able to express need for assistance with  ADLs?: Yes Independently performs ADLs?: Yes (appropriate for developmental age)  Prior Inpatient Therapy Prior Inpatient Therapy: Yes Prior Therapy Dates: 2019 [facility] Reason for Treatment: suicide attempt  Prior Outpatient Therapy Prior Outpatient Therapy: No Does patient have an ACCT team?: No Does patient have Intensive In-House Services?  : No Does patient have [facility] services? : No Does patient have P4CC services?: No  ADL Screening (condition at time of admission) Patient's cognitive ability adequate to safely complete daily activities?: Yes Is the patient deaf or have difficulty hearing?: No Does the patient have difficulty seeing, even when wearing glasses/contacts?: No Does the patient have difficulty concentrating, remembering, or making decisions?: No Patient able to express need for assistance with ADLs?: Yes Does the patient have difficulty dressing or bathing?: No Independently performs ADLs?: Yes (appropriate for developmental age) Does the patient have difficulty walking or climbing stairs?: No Weakness of Legs: None Weakness of Arms/Hands: None  Home Assistive Devices/Equipment Home Assistive Devices/Equipment: None  Therapy Consults (therapy consults require a physician order) PT Evaluation Needed: No OT Evalulation Needed: No SLP Evaluation Needed: No Abuse/Neglect Assessment (Assessment to be complete while patient is alone) Abuse/Neglect Assessment Can Be Completed: Yes Physical Abuse: Denies Verbal Abuse: Denies Sexual Abuse: Denies Exploitation of patient/patient's resources: Denies Self-Neglect: Denies Values / Beliefs Cultural Requests During Hospitalization: None Spiritual Requests During Hospitalization: None Consults Spiritual Care Consult Needed: No Social Work Consult Needed:  No Merchant navy officerAdvance Directives (For Healthcare) Does Patient Have a Medical Advance Directive?: No Would patient like information on creating a medical advance directive?: No - Patient declined          Disposition: Malachy Chamberakia Starkes, PMHNP recommends in patient. Disposition Initial Assessment Completed for this Encounter: Yes  This service was provided via telemedicine using a 2-way, interactive audio and video technology.  Names of all persons participating in this telemedicine service and their role in this encounter. Name: Tommi EmeryWilliam Boggess Role: patient  Name: Celedonio MiyamotoMeredith Abanoub Hanken, LCSW Role: TTS  Name:  Role:   Name:  Role:     Celedonio MiyamotoMeredith  Kenzly Rogoff 01/21/2019 9:36 AM

## 2019-01-21 NOTE — ED Notes (Signed)
Patient was given a Snack and Drink. A Regular Diet was ordered for Lunch. 

## 2019-01-21 NOTE — BHH Counselor (Addendum)
Per Beckie Busing, RN pt is currently sleeping. Pt's BAL is 401 at 2104. RN to call clinician once pt on medically cleared, alert, oriented and able to engage in TTS assessment.     Vertell Novak, [place], [facility], [facility] Triage Specialist [phone] or [phone].

## 2019-01-21 NOTE — ED Provider Notes (Signed)
58 year old male here for drug overdose.  Patient took several oxycodone and hydrocodone tablets last night.  He notes this morning he has minimal epigastric abdominal pain.  His abdomen is soft with minimal epigastric tenderness.  His labs were reviewed with no signs of infectious etiology.  He has no lower abdominal tenderness.  We will give him Pepcid likely gastritis.  He will reach out to nursing staff if his symptoms continue to persist or worsen.   Okey Regal, PA-C 01/21/19 1303    Tegeler, Gwenyth Allegra, MD 01/21/19 [phone]

## 2019-01-21 NOTE — Care Management (Signed)
  Under Review:  [facility]

## 2019-01-21 NOTE — ED Notes (Signed)
Patient had TTS done. 

## 2019-01-21 NOTE — ED Notes (Signed)
Breakfast Ordered 

## 2019-01-22 ENCOUNTER — Inpatient Hospital Stay (HOSPITAL_COMMUNITY)
Admission: AD | Admit: 2019-01-22 | Discharge: 2019-01-27 | DRG: 881 | Disposition: A | Discharge status: Home / Self Care | Payer: Federal, State, Local not specified - Other | Source: Intra-hospital | Attending: Psychiatry | Admitting: Psychiatry

## 2019-01-22 ENCOUNTER — Other Ambulatory Visit: Payer: Self-pay

## 2019-01-22 ENCOUNTER — Other Ambulatory Visit: Payer: Self-pay | Admitting: Behavioral Health

## 2019-01-22 ENCOUNTER — Encounter (HOSPITAL_COMMUNITY): Payer: Self-pay

## 2019-01-22 DIAGNOSIS — Z7289 Other problems related to lifestyle: Secondary | ICD-10-CM

## 2019-01-22 DIAGNOSIS — Z23 Encounter for immunization: Secondary | ICD-10-CM

## 2019-01-22 DIAGNOSIS — F332 Major depressive disorder, recurrent severe without psychotic features: Secondary | ICD-10-CM

## 2019-01-22 DIAGNOSIS — R45851 Suicidal ideations: Secondary | ICD-10-CM | POA: Diagnosis present

## 2019-01-22 DIAGNOSIS — Z20828 Contact with and (suspected) exposure to other viral communicable diseases: Secondary | ICD-10-CM | POA: Diagnosis present

## 2019-01-22 DIAGNOSIS — F1024 Alcohol dependence with alcohol-induced mood disorder: Secondary | ICD-10-CM

## 2019-01-22 DIAGNOSIS — K219 Gastro-esophageal reflux disease without esophagitis: Secondary | ICD-10-CM | POA: Diagnosis present

## 2019-01-22 DIAGNOSIS — F17210 Nicotine dependence, cigarettes, uncomplicated: Secondary | ICD-10-CM | POA: Diagnosis present

## 2019-01-22 DIAGNOSIS — F1010 Alcohol abuse, uncomplicated: Secondary | ICD-10-CM | POA: Diagnosis present

## 2019-01-22 DIAGNOSIS — G4700 Insomnia, unspecified: Secondary | ICD-10-CM | POA: Diagnosis present

## 2019-01-22 DIAGNOSIS — F329 Major depressive disorder, single episode, unspecified: Secondary | ICD-10-CM | POA: Diagnosis present

## 2019-01-22 MED ORDER — FLUOXETINE HCL 20 MG PO CAPS
20.0000 mg | ORAL_CAPSULE | Freq: Every day | ORAL | Status: DC
  Administered 2019-01-22 – 2019-01-27 (×6): 20 mg via ORAL
  Filled 2019-01-22 (×5): qty 1
  Filled 2019-01-22: qty 7
  Filled 2019-01-22 (×5): qty 1

## 2019-01-22 MED ORDER — PNEUMOCOCCAL VAC POLYVALENT 25 MCG/0.5ML IJ INJ
0.5000 mL | INJECTION | INTRAMUSCULAR | Status: AC
  Administered 2019-01-23: 0.5 mL via INTRAMUSCULAR

## 2019-01-22 MED ORDER — ONDANSETRON 4 MG PO TBDP: 4.0000 mg | ORAL_TABLET | Freq: Four times a day (QID) | ORAL | Status: DC | PRN

## 2019-01-22 MED ORDER — LORAZEPAM 1 MG PO TABS
1.0000 mg | ORAL_TABLET | Freq: Four times a day (QID) | ORAL | Status: DC | PRN
  Administered 2019-01-22 – 2019-01-23 (×2): 1 mg via ORAL
  Filled 2019-01-22 (×2): qty 1

## 2019-01-22 MED ORDER — ADULT MULTIVITAMIN W/MINERALS CH
1.0000 | ORAL_TABLET | Freq: Every day | ORAL | Status: DC
  Administered 2019-01-22 – 2019-01-27 (×6): 1 via ORAL
  Filled 2019-01-22 (×9): qty 1

## 2019-01-22 MED ORDER — HYDROXYZINE HCL 25 MG PO TABS
25.0000 mg | ORAL_TABLET | Freq: Four times a day (QID) | ORAL | Status: AC | PRN
  Administered 2019-01-23 – 2019-01-24 (×4): 25 mg via ORAL
  Filled 2019-01-22 (×4): qty 1

## 2019-01-22 MED ORDER — LOPERAMIDE HCL 2 MG PO CAPS: 2.0000 mg | ORAL_CAPSULE | ORAL | Status: DC | PRN

## 2019-01-22 MED ORDER — VITAMIN B-1 100 MG PO TABS
100.0000 mg | ORAL_TABLET | Freq: Every day | ORAL | Status: DC
  Administered 2019-01-23 – 2019-01-27 (×5): 100 mg via ORAL
  Filled 2019-01-22 (×8): qty 1

## 2019-01-22 MED ORDER — THIAMINE HCL 100 MG/ML IJ SOLN
100.0000 mg | Freq: Once | INTRAMUSCULAR | Status: AC
  Administered 2019-01-22: 100 mg via INTRAMUSCULAR
  Filled 2019-01-22: qty 2

## 2019-01-22 MED ORDER — ACETAMINOPHEN 325 MG PO TABS
650.0000 mg | ORAL_TABLET | Freq: Four times a day (QID) | ORAL | Status: DC | PRN
  Administered 2019-01-22: 23:00:00 650 mg via ORAL
  Filled 2019-01-22: qty 2

## 2019-01-22 NOTE — Progress Notes (Signed)
Patient came into the hospital to keep safe.  Patient denies currently endorses the ability to contract for safety in the hospital

## 2019-01-22 NOTE — ED Notes (Signed)
Regular Diet was ordered for Lunch. Patient refused a Snack and Drink.

## 2019-01-22 NOTE — BHH Suicide Risk Assessment (Signed)
[facility] Admission Suicide Risk Assessment   Nursing information obtained from:  Patient Demographic factors:  Male, Caucasian Current Mental Status:  Suicidal ideation indicated by patient, Suicide plan Loss Factors:  NA Historical Factors:  Prior suicide attempts Risk Reduction Factors:  Living with another person, especially a relative  Total Time spent with patient: 45 minutes Principal Problem: Diagnosis:  Active Problems:   MDD (major depressive disorder)  Subjective Data:   Continued Clinical Symptoms:  Alcohol Use Disorder Identification Test Final Score (AUDIT): 30 The "Alcohol Use Disorders Identification Test", Guidelines for Use in Primary Care, Second Edition.  World Pharmacologist [facility]). Score between 0-7:  no or low risk or alcohol related problems. Score between 8-15:  moderate risk of alcohol related problems. Score between 16-19:  high risk of alcohol related problems. Score 20 or above:  warrants further diagnostic evaluation for alcohol dependence and treatment.   CLINICAL FACTORS:  58 year old male, presented to the emergency room on August 28 after being found in a parking lot, reporting heavy drinking and chest pain.  At the time was medically cleared and discharged.  Returned on August 31 for similar presentation.  Reported heavy/daily drinking as well as more intermittent use of oxycodone and Xanax.  Admission BAL 401, admission UDS negative.  Endorses depression with neurovegetative symptoms and recent suicidal ideations.  Reports recent overdose opiate/Xanax which he has been buying (not prescribed).  Denies medical illnesses.   Psychiatric Specialty Exam: Physical Exam  ROS  There were no vitals taken for this visit.There is no height or weight on file to calculate BMI.  See admit note [facility]    COGNITIVE FEATURES THAT CONTRIBUTE TO RISK:  Closed-mindedness, Loss of executive function and Polarized thinking    SUICIDE RISK:   Moderate:  Frequent  suicidal ideation with limited intensity, and duration, some specificity in terms of plans, no associated intent, good self-control, limited dysphoria/symptomatology, some risk factors present, and identifiable protective factors, including available and accessible social support.  PLAN OF CARE: Patient will be admitted to inpatient psychiatric unit for stabilization and safety. Will provide and encourage milieu participation. Provide medication management and maked adjustments as needed.  We will also provide medication management to address potential withdrawal symptoms.  Will follow daily.    I certify that inpatient services furnished can reasonably be expected to improve the patient's condition.   Jenne Campus, MD 01/22/2019, 4:20 PM

## 2019-01-22 NOTE — H&P (Signed)
Psychiatric Admission Assessment Adult  Patient Identification: Andrew Rivas MRN:  [id number] Date of Evaluation:  01/22/2019 Chief Complaint: " depression, substance abuse  Principal Diagnosis: Alcohol, BZD, Opiate Use Disorder, Substance Induced Mood Disorder versus MDD Diagnosis: Alcohol, BZD, Opiate Use Disorder, Substance Induced Mood Disorder versus MDD  History of Present Illness: 58 year old male, presented to ED via EMS on 8/28 after being found in a parking lot,  reporting heavy drinking and chest pain. He does not know who called EMS . At the time was medically cleared and discharged. Presented to ED again on 8/31, for similar presentation. Patient's memory of events is fragmented Reportedly told EMS that he was suicidal. Today does endorse recent suicidal ideations and states " I think I took a combination of oxycodone , Xanax " and had also been drinking heavily/ regularly.He does report recent depression, and endorses neuro vegetative symptoms as below.  Reports stressors include financial stressors and being estranged from his family. Reports he has been abusing Xanax, Opiate analgesics intermittently and drinking daily, heavily, up to several 40 ounce beers per day.  Admission UDS 401, UDS negative .   Associated Signs/Symptoms: Depression Symptoms:  depressed mood, anhedonia, insomnia, suicidal thoughts with specific plan, suicidal attempt, loss of energy/fatigue, decreased appetite,  Reports he has lost 40 lbs over the last few months (Hypo) Manic Symptoms:  None noted or endorsed  Anxiety Symptoms: does not endorse increased anxiety  Psychotic Symptoms:  Reports unintelligible auditory hallucinations in the context of opiate/alcohol intoxications. Denies other psychotic symptoms or symptoms other than during intoxication. PTSD Symptoms: Does not endorse  Total Time spent with patient: 45 minutes  Past Psychiatric History: No prior psychiatric admissions. Denies  prior suicide attempts . Endorses brief psychotic symptoms in the past but only during substance intoxication, not otherwise . Endorses history of depression, exacerbated by substance abuse, but states he has depression even when abstinent. Denies history of mania or hypomania. Denies history of violence.  Is the patient at risk to self? Yes.    Has the patient been a risk to self in the past 6 months? No.  Has the patient been a risk to self within the distant past? No.  Is the patient a risk to others? No.  Has the patient been a risk to others in the past 6 months? No.  Has the patient been a risk to others within the distant past? No.   Prior Inpatient Therapy:  does not endorse  Prior Outpatient Therapy:  none   Alcohol Screening: 1. How often do you have a drink containing alcohol?: 4 or more times a week 2. How many drinks containing alcohol do you have on a typical day when you are drinking?: 10 or more 3. How often do you have six or more drinks on one occasion?: Weekly AUDIT-C Score: 11 4. How often during the last year have you found that you were not able to stop drinking once you had started?: Weekly 5. How often during the last year have you failed to do what was normally expected from you becasue of drinking?: Weekly 6. How often during the last year have you needed a first drink in the morning to get yourself going after a heavy drinking session?: Weekly 7. How often during the last year have you had a feeling of guilt of remorse after drinking?: Weekly 8. How often during the last year have you been unable to remember what happened the night before because you had  been drinking?: Weekly 9. Have you or someone else been injured as a result of your drinking?: No 10. Has a relative or friend or a doctor or another health worker been concerned about your drinking or suggested you cut down?: Yes, during the last year Alcohol Use Disorder Identification Test Final Score (AUDIT):  30 Substance Abuse History in the last 12 months:  Reports daily drinking , varying amounts ranging from 36 ounces to 280 ounces of beer daily. States he has been drinking more heavily recently. He also endorses recent Xanax abuse over the last several months, in varying amounts , up to several mgrs daily. He also endorses intermittent opiate analgesic abuse . Consequences of Substance Abuse: Denies history of withdrawal seizures, (+) history of blackouts Previous Psychotropic Medications: reports he has never been treated with psychiatric medications in the past  Psychological Evaluations:  No  Past Medical History: denies medical illnesses . Denies history of seizures. NKDA. No prescribed medications prior to admission Past Medical History:  Diagnosis Date  . Alcohol abuse   . Medical history non-contributory     Past Surgical History:  Procedure Laterality Date  . HAND SURGERY Left   . NO PAST SURGERIES     Family History: Parents deceased, mother died from unspecified cancer, father died from MI. Has one sister . Family Psychiatric  History: mother had history of depression, anxiety. Father had history of alcohol use disorder . 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  Social History   Substance and Sexual Activity  Alcohol Use Yes   Comment: daily      Social History   Substance and Sexual Activity  Drug Use Yes  . Types: Marijuana, Oxycodone    Additional Social History:  Allergies:  No Known Allergies Lab Results:  Results for orders placed or performed during the hospital encounter of 01/20/19 (from the past 48 hour(s))  Comprehensive metabolic panel     Status: Abnormal   Collection Time: 01/20/19  8:45 PM  Result Value Ref Range   Sodium 136 135 - 145 mmol/L   Potassium 3.7 3.5 - 5.1 mmol/L   Chloride 100 98 - 111 mmol/L   CO2 23 22 - 32 mmol/L    Glucose, Bld 85 70 - 99 mg/dL   BUN <5 (L) 6 - 20 mg/dL   Creatinine, Ser 1.610.75 0.61 - 1.24 mg/dL   Calcium 8.4 (L) 8.9 - 10.3 mg/dL   Total Protein 6.8 6.5 - 8.1 g/dL   Albumin 3.9 3.5 - 5.0 g/dL   AST 47 (H) 15 - 41 U/L   ALT 36 0 - 44 U/L   Alkaline Phosphatase 62 38 - 126 U/L   Total Bilirubin 0.4 0.3 - 1.2 mg/dL   GFR calc non Af Amer >60 >60 mL/min   GFR calc Af Amer >60 >60 mL/min   Anion gap 13 5 - 15    Comment: Performed at [facility], [address] [place], [place] [id number]  cbc     Status: None   Collection Time: 01/20/19  8:45 PM  Result Value Ref Range   WBC 6.4 4.0 - 10.5 K/uL   RBC 4.51 4.22 - 5.81 MIL/uL   Hemoglobin 14.2 13.0 - 17.0 g/dL   HCT 54.041.5 98.139.0 - 19.152.0 %   MCV 92.0 80.0 - 100.0 fL   MCH 31.5 26.0 - 34.0  pg   MCHC 34.2 30.0 - 36.0 g/dL   RDW 12.2 48.2 - 50.0 %   Platelets 224 150 - 400 K/uL   nRBC 0.0 0.0 - 0.2 %    Comment: Performed at [facility], [address] [place], [place] [address]  Rapid urine drug screen (hospital performed)     Status: None   Collection Time: 01/20/19  8:45 PM  Result Value Ref Range   Opiates NONE DETECTED NONE DETECTED   Cocaine NONE DETECTED NONE DETECTED   Benzodiazepines NONE DETECTED NONE DETECTED   Amphetamines NONE DETECTED NONE DETECTED   Tetrahydrocannabinol NONE DETECTED NONE DETECTED   Barbiturates NONE DETECTED NONE DETECTED    Comment: (NOTE) DRUG SCREEN FOR MEDICAL PURPOSES ONLY.  IF CONFIRMATION IS NEEDED FOR ANY PURPOSE, NOTIFY LAB WITHIN 5 DAYS. LOWEST DETECTABLE LIMITS FOR URINE DRUG SCREEN Drug Class                     Cutoff (ng/mL) Amphetamine and metabolites    1000 Barbiturate and metabolites    200 Benzodiazepine                 200 Tricyclics and metabolites     300 Opiates and metabolites        300 Cocaine and metabolites        300 THC                            50 Performed at [facility], [address], [place], [place] [address]   Urinalysis, Routine  w reflex microscopic     Status: Abnormal   Collection Time: 01/20/19  8:45 PM  Result Value Ref Range   Color, Urine COLORLESS (A) YELLOW   APPearance CLEAR CLEAR   Specific Gravity, Urine 1.001 (L) 1.005 - 1.030   pH 5.0 5.0 - 8.0   Glucose, UA NEGATIVE NEGATIVE mg/dL   Hgb urine dipstick NEGATIVE NEGATIVE   Bilirubin Urine NEGATIVE NEGATIVE   Ketones, ur NEGATIVE NEGATIVE mg/dL   Protein, ur NEGATIVE NEGATIVE mg/dL   Nitrite NEGATIVE NEGATIVE   Leukocytes,Ua NEGATIVE NEGATIVE    Comment: Performed at [facility], [address], [place], [place] [address]  Ethanol     Status: Abnormal   Collection Time: 01/20/19  9:04 PM  Result Value Ref Range   Alcohol, Ethyl (B) 401 (HH) <10 mg/dL    Comment: CRITICAL RESULT CALLED TO, READ BACK BY AND VERIFIED WITH: MUNNETT [facility] 01/20/19 2151 WAYK Performed at [facility], [address] [place], [place] [address]   Salicylate level     Status: None   Collection Time: 01/20/19  9:04 PM  Result Value Ref Range   Salicylate Lvl <7.0 2.8 - 30.0 mg/dL    Comment: Performed at [facility], [address] [place], [place] [address]  Acetaminophen level     Status: Abnormal   Collection Time: 01/20/19  9:04 PM  Result Value Ref Range   Acetaminophen (Tylenol), Serum <10 (L) 10 - 30 ug/mL    Comment: Performed at [facility], [address] [place], [place] [address]  CBG monitoring, ED     Status: None   Collection Time: 01/20/19  9:21 PM  Result Value Ref Range   Glucose-Capillary 80 70 - 99 mg/dL   Comment 1 Notify RN    Comment 2 Document in Chart  SARS CORONAVIRUS 2 (TAT 6-24 HRS) Nasopharyngeal Nasopharyngeal Swab     Status: None   Collection Time: 01/20/19  9:38 PM   Specimen: Nasopharyngeal Swab  Result Value Ref Range   SARS Coronavirus 2 NEGATIVE NEGATIVE    Comment: (NOTE) SARS-CoV-2 target nucleic acids are NOT DETECTED. The SARS-CoV-2 RNA is generally detectable in upper and lower respiratory  specimens during the acute phase of infection. Negative results do not preclude SARS-CoV-2 infection, do not rule out co-infections with other pathogens, and should not be used as the sole basis for treatment or other patient management decisions. Negative results must be combined with clinical observations, patient history, and epidemiological information. The expected result is Negative. Fact Sheet for Patients: HairSlick.no Fact Sheet for Healthcare Providers: quierodirigir.com This test is not yet approved or cleared by the [place] FDA and  has been authorized for detection and/or diagnosis of SARS-CoV-2 by FDA under an Emergency Use Authorization (EUA). This EUA will remain  in effect (meaning this test can be used) for the duration of the COVID-19 declaration under Section 56 4(b)(1) of the Act, 21 U.S.C. section 360bbb-3(b)(1), unless the authorization is terminated or revoked sooner. Performed at [facility], [address], [place], [place] [address]     Blood Alcohol level:  Lab Results  Component Value Date   ETH 401 [facility]) 01/20/2019   ETH 386 (HH) 01/17/2019    Metabolic Disorder Labs:  No results found for: HGBA1C, MPG No results found for: PROLACTIN No results found for: CHOL, TRIG, HDL, CHOLHDL, VLDL, LDLCALC  Current Medications: Current Facility-Administered Medications  Medication Dose Route Frequency Provider Last Rate Last Dose  . hydrOXYzine (ATARAX/VISTARIL) tablet 25 mg  25 mg Oral Q6H PRN Denzil Magnuson, NP      . loperamide (IMODIUM) capsule 2-4 mg  2-4 mg Oral PRN Denzil Magnuson, NP      . LORazepam (ATIVAN) tablet 1 mg  1 mg Oral Q6H PRN Denzil Magnuson, NP   1 mg at 01/22/19 1408  . multivitamin with minerals tablet 1 tablet  1 tablet Oral Daily Denzil Magnuson, NP   1 tablet at 01/22/19 1404  . ondansetron (ZOFRAN-ODT) disintegrating tablet 4 mg  4 mg Oral Q6H PRN Denzil Magnuson, NP      . Melene Muller ON 01/23/2019] pneumococcal 23 valent vaccine (PNU-IMMUNE) injection 0.5 mL  0.5 mL Intramuscular Tomorrow-1000 Cobos, Rockey Situ, MD      . Melene Muller ON 01/23/2019] thiamine (VITAMIN B-1) tablet 100 mg  100 mg Oral Daily Denzil Magnuson, NP       PTA Medications: No medications prior to admission.    Musculoskeletal: Strength & Muscle Tone: within normal limits- no significant tremors or diaphoresis, no restlessness or agitation. Vitals stable.  Gait & Station: normal Patient leans: N/A  Psychiatric Specialty Exam: Physical Exam  Review of Systems  Constitutional: Negative.  Negative for chills and fever.  HENT: Negative.   Eyes: Negative.   Respiratory: Negative for cough and shortness of breath.   Cardiovascular: Negative.  Negative for chest pain.  Gastrointestinal: Negative.  Negative for diarrhea, nausea and vomiting.  Genitourinary: Negative.   Musculoskeletal: Negative.   Skin: Negative.  Negative for rash.  Neurological: Positive for headaches. Negative for seizures.  Endo/Heme/Allergies: Negative.   Psychiatric/Behavioral: Positive for depression, substance abuse and suicidal ideas.  All other systems reviewed and are negative.   There were no vitals taken for this visit.There is no height or weight on file to calculate BMI.  General Appearance: Well Groomed  Eye Contact:  Good  Speech:  Normal Rate  Volume:  Normal  Mood:  Depressed  Affect:  constricted  Thought Process:  Linear and Descriptions of Associations: Intact  Orientation:  Other:  fully alert and attentive  Thought Content:  no hallucinations, no delusions, not internally preoccupied   Suicidal Thoughts:  No denies suicidal ideations at this time and contracts for safety on unit, denies homicidal or violent ideations  Homicidal Thoughts:  No  Memory:  recent and remote grossly intact   Judgement:  Fair  Insight:  Fair  Psychomotor Activity:  Normal- no current tremors or  diaphoresis noted, in no acute distress or discomfort at this time  Concentration:  Concentration: Good and Attention Span: Good  Recall:  Good  Fund of Knowledge:  Good  Language:  Good  Akathisia:  Negative  Handed:  Right  AIMS (if indicated):     Assets:  Communication Skills Desire for Improvement Resilience  ADL's:  Intact  Cognition:  WNL  Sleep:       Treatment Plan Summary: Daily contact with patient to assess and evaluate symptoms and progress in treatment, Medication management, Plan inpatient treatment and medications as below  Observation Level/Precautions:  15 minute checks  Laboratory:  as needed- TSH  Psychotherapy:  Milieu, group therapy   Medications:  We reviewed options. Patient is not currently presenting with symptoms of WDL and vitals are stable. Will continue Ativan PRN as per CIWA protocol for management of WDL as needed . Agrees to antidepressant trial, agrees to Prozac, will start 20 mgrs QDAY .   Consultations:  As needed   Discharge Concerns:  -  Estimated LOS: 4-5 days   Other:     Physician Treatment Plan for Primary Diagnosis: Polysubstance Use Disorder (  Alcohol , BZD use Disorder ) Long Term Goal(s): Improvement in symptoms so as ready for discharge  Short Term Goals: Ability to identify changes in lifestyle to reduce recurrence of condition will improve and Ability to identify triggers associated with substance abuse/mental health issues will improve  Physician Treatment Plan for Secondary Diagnosis: Substance /Alcohol Use Disorder , Substance Induced Mood Disorder, depressed versus MDD Long Term Goal(s): Improvement in symptoms so as ready for discharge  Short Term Goals: Ability to identify changes in lifestyle to reduce recurrence of condition will improve, Ability to verbalize feelings will improve, Ability to disclose and discuss suicidal ideas, Ability to demonstrate self-control will improve, Ability to identify and develop effective  coping behaviors will improve and Ability to maintain clinical measurements within normal limits will improve  I certify that inpatient services furnished can reasonably be expected to improve the patient's condition.    Craige CottaFernando A Cobos, MD 9/2/20203:46 PM

## 2019-01-22 NOTE — ED Notes (Signed)
Per [place], [facility] RN, pt has been accepted to [facility] 303-1 - may arrive after 1200. Report given.

## 2019-01-22 NOTE — BHH Group Notes (Signed)
[place] Group Notes:  (Nursing/MHT/Case Management/Adjunct)  Date:  01/22/2019  Time:  1:30 PM  Type of Therapy:  Nurse Education  Participation Level:  Minimal  Participation Quality:  Appropriate and Attentive  Affect:  Appropriate and Flat  Cognitive:  Appropriate  Insight:  Appropriate and Good  Engagement in Group:  None  Modes of Intervention:  Discussion, Education, Exploration and Support  Summary of Progress/Problems: Pt's were asked to discuss personal development through short and long term goal setting. Pt's were asked to share their goal for the day, how they were achieving this, impediments to their goal, and resources they could leverage to overcome those.  Pt was minimal with it being his first day. Pt shared briefly, was attentive but reserved.   Otelia Limes Eknoor Novack 01/22/2019, 5:33 PM

## 2019-01-22 NOTE — ED Notes (Addendum)
Pt voiced understanding and agreement w/tx plan - Accepted to [facility] 303-1 - Signed consent form - Copy faxed to [facility] - Copy sent to Medical Records - Original placed in envelope for [facility]. ALL belongings - 1 labeled belongings bag and 1 valuables envelope - [facility] - Pt aware. Signature pad not working - pt attempted to sign. Offered for pt to call family member/friend - declined.

## 2019-01-22 NOTE — Progress Notes (Signed)
Pt accepted to [facility], bed 303-1    Dr. Parke Poisson is the attending provider.    Call report to [phone]    Pt is voluntary and can be transported by [facility].   Pt is scheduled to arrive as soon as transportation can be arranged.   Audree Camel, LCSW, [place] Disposition [facility] [facility] BHH/TTS [phone]

## 2019-01-22 NOTE — Progress Notes (Signed)
Patient ID: Andrew Rivas, male   DOB: 01-Sep-1960, 58 y.o.   MRN: [id number] Patient admitted to the unit voluntarily due to increased depression and suicidal ideations.  Patient endorses a plan to commit suicide by overdosing on medications outside of the hospital.  Patient reports increased depression and feeling frustrated because he can not find the root of his depression.  Patient states that he desires to feel better and wants a reason to live.  Patient also endorses auditory hallucinations but states the voices have been decreasing.  Patient reports being a heavy drinker weekly drinking to the point of blacking out and not remembering things from the night before.     Skin assessment complete patient found to have a healing abraision on his left knee. No further injury noted. Patient admitted to the unit without further incident.

## 2019-01-23 LAB — LIPID PANEL
Cholesterol: 205 mg/dL — ABNORMAL HIGH (ref 0–200)
HDL: 119 mg/dL (ref >40)
LDL Cholesterol: 78 mg/dL (ref 0–99)
Total CHOL/HDL Ratio: 1.7 RATIO
Triglycerides: 40 mg/dL (ref <150)
VLDL: 8 mg/dL (ref 0–40)

## 2019-01-23 LAB — TSH: TSH: 2.15 u[IU]/mL (ref 0.350–4.500)

## 2019-01-23 LAB — HEMOGLOBIN A1C
Hgb A1c MFr Bld: 5.6 % (ref 4.8–5.6)
Mean Plasma Glucose: 114.02 mg/dL

## 2019-01-23 MED ORDER — GABAPENTIN 100 MG PO CAPS
100.0000 mg | ORAL_CAPSULE | Freq: Three times a day (TID) | ORAL | Status: DC
  Administered 2019-01-23 – 2019-01-27 (×12): 100 mg via ORAL
  Filled 2019-01-23 (×18): qty 1

## 2019-01-23 NOTE — BHH Group Notes (Signed)
Adult Psychoeducational Group Note  Date:  01/23/2019 Time:  9:26 PM  Group Topic/Focus:  Wrap-Up Group:   The focus of this group is to help patients review their daily goal of treatment and discuss progress on daily workbooks.  Participation Level:  Active  Participation Quality:  Appropriate and Attentive  Affect:  Appropriate  Cognitive:  Alert and Appropriate  Insight: Appropriate and Good  Engagement in Group:  Engaged  Modes of Intervention:  Discussion and Education  Additional Comments:  Pt attended and participated in wrap up group this evening and rated their day a 3/10. Pt started their goal, which was to get their medications on track.   Andrew Rivas 01/23/2019, 9:26 PM

## 2019-01-23 NOTE — Progress Notes (Signed)
Pt did attend the evening wrap up group. 

## 2019-01-23 NOTE — BHH Group Notes (Signed)
LCSW Aftercare Discharge Planning Group Note  01/23/2019 8:45am  Type of Group and Topic: Psychoeducational Group: Discharge Planning  Participation Level: Active  Description of Group  Discharge planning group reviews patient's anticipated discharge plans and assists patients to anticipate and address any barriers to wellness/recovery in the community. Suicide prevention education is reviewed with patients in group.  Therapeutic Goals  1. Patients will state their anticipated discharge plan and mental health aftercare  2. Patients will identify potential barriers to wellness in the community setting  3. Patients will engage in problem solving, solution focused discussion of ways to anticipate and address barriers to wellness/recovery  Summary of Patient Progress  Plan for Discharge/Comments: Not sure. He is interested in residential substance use treatment.  Transportation Means: Public transit.  Supports: Limited supports  Therapeutic Modalities:  [place], [place]  01/23/2019 3:16 PM

## 2019-01-23 NOTE — Progress Notes (Signed)
Patient ID: Andrew Rivas, male   DOB: May 22, 1961, 58 y.o.   MRN: [id number] D: Patient observed watching TV in dayroom. Pt mood and affect appears anxious. Pt stated he had engaged in most of the group activities. Denies  SI/HI/AVH and pain.No behavioral issues noted.  A: Support and encouragement offered as needed to express needs. Medications administered as prescribed.  R: Patient is safe and cooperative on unit. Will continue to monitor  for safety and stability.

## 2019-01-23 NOTE — BHH Group Notes (Signed)
[place] Group Notes:  (Nursing/MHT/Case Management/Adjunct)  Date:  01/23/2019  Time:  10:00 AM  Type of Therapy:  Nurse Education  Participation Level:  Minimal  Participation Quality:  Appropriate and Attentive  Affect:  Appropriate  Cognitive:  Alert and Appropriate  Insight:  Appropriate and Lacking  Engagement in Group:  Engaged and Improving  Modes of Intervention:  Discussion, Education, Exploration, Socialization and Support  Summary of Progress/Problems: pt's were asked to discuss crisis present and past. Pt's were guided through triggers or events they feel lead to the need for crisis management. Pt's stated their goal for today regarding crisis management, how this goal would help decrease future crisis, and how they were going to achieve this. Lastly, Pt's discussed alternative interventions they could use instead of negative coping strategies currently being utilized.  Pt shared minimally but was attentive and appropriate.   Otelia Limes Karmine Kauer 01/23/2019, 12:26 PM

## 2019-01-23 NOTE — Progress Notes (Signed)
[facility] MD Progress Note  01/23/2019 2:40 PM Andrew Rivas  MRN:  [id number] Subjective: Patient reports he is feeling "a little better".  Today denies suicidal ideations.  Reports some subjective feelings of withdrawal such as feeling fairly jittery.  Denies medication side effects. Objective : I have discussed case with treatment team and have met with patient. 58 year old male, presented to the emergency room on August 28 after being found in a parking lot, reporting heavy drinking and chest pain.  At the time was medically cleared and discharged.  Returned on August 31 for similar presentation.  Reported heavy/daily drinking as well as more intermittent use of oxycodone and Xanax.  Admission BAL 401, admission UDS negative.  Endorses depression with neurovegetative symptoms and recent suicidal ideations.  Reports recent overdose opiate/Xanax which he has been buying (not prescribed).  Denies medical illnesses.  Today patient presents alert, attentive, in no acute distress.  No significant distal tremors or diaphoresis/restlessness noted at this time.  Vitals are currently stable (BP 104/76, pulse 67) At this time denies medication side effects-currently on Prozac.  We reviewed Neurontin as an option for anxiety/alcohol use disorder.  Side effects reviewed. Patient presents polite on approach, vaguely anxious.  No disruptive or agitated behaviors on unit.  Limited milieu participation at this time. Expressing interest in rehab at discharge as a possible disposition option. Vitals reviewed - TSH 2.15,  Hgb 5.6 , Lipid Panel unremarkable   Principal Problem: Alcohol, BZD, Opiate Use Disorder, Substance Induced Mood Disorder versus MDD  Diagnosis: Alcohol, BZD, Opiate Use Disorder, Substance Induced Mood Disorder versus MDD  Total Time spent with patient: 20 minutes  Past Psychiatric History:  Past Medical History:  Past Medical History:  Diagnosis Date  . Alcohol abuse   . Medical history  non-contributory     Past Surgical History:  Procedure Laterality Date  . HAND SURGERY Left   . NO PAST SURGERIES     Family History: History reviewed. No pertinent family history. Family Psychiatric  History:  Social History:  Social History   Substance and Sexual Activity  Alcohol Use Yes   Comment: daily      Social History   Substance and Sexual Activity  Drug Use Yes  . Types: Marijuana, Oxycodone    Social History   Socioeconomic History  . Marital status: Single    Spouse name: Not on file  . Number of children: Not on file  . Years of education: Not on file  . Highest education level: Not on file  Occupational History  . Not on file  Social Needs  . Financial resource strain: Not on file  . Food insecurity    Worry: Not on file    Inability: Not on file  . Transportation needs    Medical: Not on file    Non-medical: Not on file  Tobacco Use  . Smoking status: Current Every Day Smoker    Packs/day: 1.00  . Smokeless tobacco: Never Used  Substance and Sexual Activity  . Alcohol use: Yes    Comment: daily   . Drug use: Yes    Types: Marijuana, Oxycodone  . Sexual activity: Not Currently    Birth control/protection: None  Lifestyle  . Physical activity    Days per week: Not on file    Minutes per session: Not on file  . Stress: Not on file  Relationships  . Social Herbalist on phone: Not on file    Gets together:  Not on file    Attends religious service: Not on file    Active member of club or organization: Not on file    Attends meetings of clubs or organizations: Not on file    Relationship status: Not on file  Other Topics Concern  . Not on file  Social History Narrative   ** Merged History Encounter **       Additional Social History:   Sleep: Fair  Appetite:  improving   Current Medications: Current Facility-Administered Medications  Medication Dose Route Frequency Provider Last Rate Last Dose  . acetaminophen  (TYLENOL) tablet 650 mg  650 mg Oral Q6H PRN Deloria Lair, NP   650 mg at 01/22/19 2235  . FLUoxetine (PROZAC) capsule 20 mg  20 mg Oral Daily , Myer Peer, MD   20 mg at 01/23/19 0837  . gabapentin (NEURONTIN) capsule 100 mg  100 mg Oral TID ,  A, MD      . hydrOXYzine (ATARAX/VISTARIL) tablet 25 mg  25 mg Oral Q6H PRN Mordecai Maes, NP   25 mg at 01/23/19 1217  . LORazepam (ATIVAN) tablet 1 mg  1 mg Oral Q6H PRN Mordecai Maes, NP   1 mg at 01/22/19 1408  . multivitamin with minerals tablet 1 tablet  1 tablet Oral Daily Mordecai Maes, NP   1 tablet at 01/23/19 [phone]  . thiamine (VITAMIN B-1) tablet 100 mg  100 mg Oral Daily Mordecai Maes, NP   100 mg at 01/23/19 3329    Lab Results:  Results for orders placed or performed during the hospital encounter of 01/22/19 (from the past 48 hour(s))  Hemoglobin A1c     Status: None   Collection Time: 01/23/19  6:25 AM  Result Value Ref Range   Hgb A1c MFr Bld 5.6 4.8 - 5.6 %    Comment: (NOTE) Pre diabetes:          5.7%-6.4% Diabetes:              >6.4% Glycemic control for   <7.0% adults with diabetes    Mean Plasma Glucose 114.02 mg/dL    Comment: Performed at [facility] [address], [place], [place] [address]  Lipid panel     Status: Abnormal   Collection Time: 01/23/19  6:25 AM  Result Value Ref Range   Cholesterol 205 (H) 0 - 200 mg/dL   Triglycerides 40 <150 mg/dL   HDL 119 >40 mg/dL   Total CHOL/HDL Ratio 1.7 RATIO   VLDL 8 0 - 40 mg/dL   LDL Cholesterol 78 0 - 99 mg/dL    Comment:        Total Cholesterol/HDL:CHD Risk Coronary Heart Disease Risk Table                     Men   Women  1/2 Average Risk   3.4   3.3  Average Risk       5.0   4.4  2 X Average Risk   9.6   7.1  3 X Average Risk  23.4   11.0        Use the calculated Patient Ratio above and the CHD Risk Table to determine the patient's CHD Risk.        ATP III CLASSIFICATION (LDL):  <100     mg/dL   Optimal  100-129   mg/dL   Near or Above  Optimal  130-159  mg/dL   Borderline  160-189  mg/dL   High  >190     mg/dL   Very High Performed at [facility] [address], [place], [place] [address]   TSH     Status: None   Collection Time: 01/23/19  6:25 AM  Result Value Ref Range   TSH 2.150 0.350 - 4.500 uIU/mL    Comment: Performed by a 3rd Generation assay with a functional sensitivity of <=0.01 uIU/mL. Performed at [facility], [place] [address], [place], [place] [address]     Blood Alcohol level:  Lab Results  Component Value Date   WGN 562 [facility]) 01/20/2019   ETH 386 (HH) 13/12/6576    Metabolic Disorder Labs: Lab Results  Component Value Date   HGBA1C 5.6 01/23/2019   MPG 114.02 01/23/2019   No results found for: PROLACTIN Lab Results  Component Value Date   CHOL 205 (H) 01/23/2019   TRIG 40 01/23/2019   HDL 119 01/23/2019   CHOLHDL 1.7 01/23/2019   VLDL 8 01/23/2019   LDLCALC 78 01/23/2019    Physical Findings: AIMS:  , ,  ,  ,    CIWA:  CIWA-Ar Total: 1 COWS:     Musculoskeletal: Strength & Muscle Tone: within normal limits no significant tremors or diaphoresis noted at this time, no psychomotor restlessness or agitation Gait & Station: normal Patient leans: N/A  Psychiatric Specialty Exam: Physical Exam  ROS denies headache or chest pain, no shortness of breath or vomiting , no fever or chills  Blood pressure 104/76, pulse 67, temperature 98 F (36.7 C), temperature source Oral, resp. rate 18.There is no height or weight on file to calculate BMI.  General Appearance: Fairly Groomed  Eye Contact:  Good  Speech:  Normal Rate  Volume:  Normal  Mood:  improving , anxious   Affect:  Congruent and anxious, smiles at times appropriately  Thought Process:  Linear and Descriptions of Associations: Intact  Orientation:  Other:  fully alert and attentive  Thought Content:  no hallucinations, no delusions , not  internally preoccupied  Suicidal Thoughts:  No currently denies suicidal or self injurious ideations, denies homicidal or violent ideations  Homicidal Thoughts:  No  Memory:  recent and remote grossly intact   Judgement:  Fair/ improving  Insight:  Fair/ improving   Psychomotor Activity:  no overt restlessness or agitation, no significant distal tremors noted at this time  Concentration:  Concentration: Good and Attention Span: Good  Recall:  Good  Fund of Knowledge:  Good  Language:  Good  Akathisia:  Negative  Handed:  Right  AIMS (if indicated):     Assets:  Communication Skills Desire for Improvement Resilience  ADL's:  Intact  Cognition:  WNL  Sleep:  Number of Hours: 3.25   Assessment -  58 year old male, presented to the emergency room on August 28 after being found in a parking lot, reporting heavy drinking and chest pain.  At the time was medically cleared and discharged.  Returned on August 31 for similar presentation.  Reported heavy/daily drinking as well as more intermittent use of oxycodone and Xanax.  Admission BAL 401, admission UDS negative.  Endorses depression with neurovegetative symptoms and recent suicidal ideations.  Reports recent overdose opiate/Xanax which he has been buying (not prescribed).  Denies medical illnesses.  Currently patient reports partially improved mood, denies SI and presents future oriented, considering going to rehab as an option. Remains vaguely  anxious. No significant alcohol WDL symptoms at this time and vitals are currently stable. Tolerating Prozac well thus far .  Treatment Plan Summary: Daily contact with patient to assess and evaluate symptoms and progress in treatment, Medication management, Plan inpatient treatment and medications as below Encourage group and milieu participation to work on coping skills and symptom reduction Encourage efforts to work on sobriety and relapse prevention Treatment team working on disposition planning  options, currently patient considering going to a rehab at discharge if available Continue Prozac 20 mgrs QDAY for depression, anxiety Start Neurontin 100 mgrs TID for anxiety, alcohol use disorder- side effects reviewed Continue Ativan PRN for alcohol WDL symptoms as needed, continue Thiamine and Folate supplementation Jenne Campus, MD 01/23/2019, 2:40 PM

## 2019-01-24 MED ORDER — TRAZODONE HCL 50 MG PO TABS
50.0000 mg | ORAL_TABLET | Freq: Every evening | ORAL | Status: DC | PRN
  Administered 2019-01-24: 21:00:00 50 mg via ORAL
  Filled 2019-01-24: qty 1

## 2019-01-24 NOTE — Progress Notes (Signed)
Recreation Therapy Notes  Date:  9.4.20 Time: 0930 Location: [address] Dayroom  Group Topic: Stress Management  Goal Area(s) Addresses:  Patient will identify positive stress management techniques. Patient will identify benefits of using stress management post d/c.  Behavioral Response: Engaged  Intervention: Stress Management  Activity : Meditation.  LRT played a meditation that focused on the power of being resilient in the face of adversity.  Patients were to listen as meditation played to follow along.  Education:  Stress Management, Discharge Planning.   Education Outcome: Acknowledges Education  Clinical Observations/Feedback: Pt attended and participated in group.     Vianca Bracher, LRT/CTRS        Rayelynn Loyal A 01/24/2019 10:46 AM 
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
## 2019-01-24 NOTE — Tx Team (Signed)
Interdisciplinary Treatment and Diagnostic Plan Update  01/24/2019 Time of Session: 9:20am Andrew Rivas MRN: [id number]  Principal Diagnosis: <principal problem not specified>  Secondary Diagnoses: Active Problems:   MDD (major depressive disorder)   Current Medications:  Current Facility-Administered Medications  Medication Dose Route Frequency Provider Last Rate Last Dose  . acetaminophen (TYLENOL) tablet 650 mg  650 mg Oral Q6H PRN Deloria Lair, NP   650 mg at 01/22/19 2235  . FLUoxetine (PROZAC) capsule 20 mg  20 mg Oral Daily Cobos, Myer Peer, MD   20 mg at 01/24/19 0819  . gabapentin (NEURONTIN) capsule 100 mg  100 mg Oral TID Cobos, Myer Peer, MD   100 mg at 01/24/19 0818  . hydrOXYzine (ATARAX/VISTARIL) tablet 25 mg  25 mg Oral Q6H PRN Mordecai Maes, NP   25 mg at 01/23/19 2145  . LORazepam (ATIVAN) tablet 1 mg  1 mg Oral Q6H PRN Mordecai Maes, NP   1 mg at 01/23/19 1508  . multivitamin with minerals tablet 1 tablet  1 tablet Oral Daily Mordecai Maes, NP   1 tablet at 01/24/19 0818  . thiamine (VITAMIN B-1) tablet 100 mg  100 mg Oral Daily Mordecai Maes, NP   100 mg at 01/24/19 0818  . traZODone (DESYREL) tablet 50 mg  50 mg Oral QHS PRN Sharma Covert, MD       PTA Medications: No medications prior to admission.    Patient Stressors:    Patient Strengths:    Treatment Modalities: Medication Management, Group therapy, Case management,  1 to 1 session with clinician, Psychoeducation, Recreational therapy.   Physician Treatment Plan for Primary Diagnosis: <principal problem not specified> Long Term Goal(s): Improvement in symptoms so as ready for discharge Improvement in symptoms so as ready for discharge   Short Term Goals: Ability to identify changes in lifestyle to reduce recurrence of condition will improve Ability to identify triggers associated with substance abuse/mental health issues will improve Ability to identify changes in lifestyle  to reduce recurrence of condition will improve Ability to verbalize feelings will improve Ability to disclose and discuss suicidal ideas Ability to demonstrate self-control will improve Ability to identify and develop effective coping behaviors will improve Ability to maintain clinical measurements within normal limits will improve  Medication Management: Evaluate patient's response, side effects, and tolerance of medication regimen.  Therapeutic Interventions: 1 to 1 sessions, Unit Group sessions and Medication administration.  Evaluation of Outcomes: Not Met  Physician Treatment Plan for Secondary Diagnosis: Active Problems:   MDD (major depressive disorder)  Long Term Goal(s): Improvement in symptoms so as ready for discharge Improvement in symptoms so as ready for discharge   Short Term Goals: Ability to identify changes in lifestyle to reduce recurrence of condition will improve Ability to identify triggers associated with substance abuse/mental health issues will improve Ability to identify changes in lifestyle to reduce recurrence of condition will improve Ability to verbalize feelings will improve Ability to disclose and discuss suicidal ideas Ability to demonstrate self-control will improve Ability to identify and develop effective coping behaviors will improve Ability to maintain clinical measurements within normal limits will improve     Medication Management: Evaluate patient's response, side effects, and tolerance of medication regimen.  Therapeutic Interventions: 1 to 1 sessions, Unit Group sessions and Medication administration.  Evaluation of Outcomes: Not Met   RN Treatment Plan for Primary Diagnosis: <principal problem not specified> Long Term Goal(s): Knowledge of disease and therapeutic regimen to maintain health will improve  Short Term Goals: Ability to participate in decision making will improve, Ability to verbalize feelings will improve, Ability to  disclose and discuss suicidal ideas, Ability to identify and develop effective coping behaviors will improve and Compliance with prescribed medications will improve  Medication Management: RN will administer medications as ordered by provider, will assess and evaluate patient's response and provide education to patient for prescribed medication. RN will report any adverse and/or side effects to prescribing provider.  Therapeutic Interventions: 1 on 1 counseling sessions, Psychoeducation, Medication administration, Evaluate responses to treatment, Monitor vital signs and CBGs as ordered, Perform/monitor CIWA, COWS, AIMS and Fall Risk screenings as ordered, Perform wound care treatments as ordered.  Evaluation of Outcomes: Not Met   LCSW Treatment Plan for Primary Diagnosis: <principal problem not specified> Long Term Goal(s): Safe transition to appropriate next level of care at discharge, Engage patient in therapeutic group addressing interpersonal concerns.  Short Term Goals: Engage patient in aftercare planning with referrals and resources  Therapeutic Interventions: Assess for all discharge needs, 1 to 1 time with Social worker, Explore available resources and support systems, Assess for adequacy in community support network, Educate family and significant other(s) on suicide prevention, Complete Psychosocial Assessment, Interpersonal group therapy.  Evaluation of Outcomes: Not Met   Progress in Treatment: Attending groups: Yes. Participating in groups: Yes. Taking medication as prescribed: Yes. Toleration medication: No. Family/Significant other contact made: No, will contact:  if patient consents to collateral contacts Patient understands diagnosis: Yes. Discussing patient identified problems/goals with staff: Yes. Medical problems stabilized or resolved: Yes. Denies suicidal/homicidal ideation: Yes. Issues/concerns per patient self-inventory: No. Other:   New problem(s)  identified: None   New Short Term/Long Term Goal(s):Detox, medication stabilization, elimination of SI thoughts, development of comprehensive mental wellness plan.    Patient Goals: "Get my mind back and to get on some correct medications so I can function"    Discharge Plan or Barriers: Patient recently admitted. CSW will continue to follow and assess for appropriate referrals and possible discharge planning.    Reason for Continuation of Hospitalization: Anxiety Depression Medication stabilization Suicidal ideation Withdrawal symptoms  Estimated Length of Stay: 3-5 days  Attendees: Patient: Andrew Rivas 01/24/2019 10:05 AM  Physician: Dr. Myles Lipps, MD 01/24/2019 10:05 AM  Nursing: Hadley Pen.Jerilynn Mages , RN 01/24/2019 10:05 AM  RN Care Manager: 01/24/2019 10:05 AM  Social Worker: Radonna Ricker, [place] 01/24/2019 10:05 AM  Recreational Therapist:  01/24/2019 10:05 AM  Other:  01/24/2019 10:05 AM  Other:  01/24/2019 10:05 AM  Other: 01/24/2019 10:05 AM    Scribe for Treatment Team: Marylee Floras, [place] 01/24/2019 10:05 AM

## 2019-01-24 NOTE — [facility] Group Notes (Signed)
LCSW Group Therapy Note 01/24/2019 11:47 AM  Type of Therapy/Topic: Group Therapy: Feelings about Diagnosis  Participation Level: Minimal   Description of Group:  This group will allow patients to explore their thoughts and feelings about diagnoses they have received. Patients will be guided to explore their level of understanding and acceptance of these diagnoses. Facilitator will encourage patients to process their thoughts and feelings about the reactions of others to their diagnosis and will guide patients in identifying ways to discuss their diagnosis with significant others in their lives. This group will be process-oriented, with patients participating in exploration of their own experiences, giving and receiving support, and processing challenge from other group members.  Therapeutic Goals: 1. Patient will demonstrate understanding of diagnosis as evidenced by identifying two or more symptoms of the disorder 2. Patient will be able to express two feelings regarding the diagnosis 3. Patient will demonstrate their ability to communicate their needs through discussion and/or role play  Summary of Patient Progress:  Yvan was engaged, however he did not participate in the group's discussion.     Therapeutic Modalities:  Cognitive Behavioral Therapy Brief Therapy Feelings Identification    [place] Clinical Social Worker

## 2019-01-24 NOTE — Progress Notes (Signed)
[facility] MD Progress Note  01/24/2019 11:27 AM Andrew Rivas  MRN:  [id number] Subjective: Patient is a 58 year old male with a known past psychiatric history significant for alcohol dependence, benzodiazepine dependence, opiate use disorder, substance-induced mood disorder versus major depression.  He presented to the [facility] emergency department on 01/17/2019 with suicidal ideation.  His complaint at that time was that he had drank a lot of beer that day, and then had pain across his chest.  During the interview he also stated he had suicidal ideation.  He was admitted to our facility on 01/22/2019.  Objective: Patient is a 58 year old male with the above-stated past psychiatric history who is seen in follow-up.  Nursing notes reflect that he denied suicidal ideation, homicidal ideation, auditory or visual hallucinations.  His blood alcohol on admission was 401.  The rest of his drug screen was negative.  Yesterday he stated he felt a little bit better, and today he states he is doing even a little bit more better.  He remains on fluoxetine, gabapentin, hydroxyzine, lorazepam, thiamine and trazodone.  His blood pressure stable at 98/57, respirations were 16, his pulse was 112 this morning.  He is afebrile.  He slept 5.75 hours last night.  Review of his laboratories showed the negative drug screen, blood alcohol 401.  Blood sugar on 8/31 was 80.  Liver function enzymes are essentially normal with a mild elevation of his AST at 47.  His CBC was essentially normal.  Principal Problem: <principal problem not specified> Diagnosis: Active Problems:   MDD (major depressive disorder)  Total Time spent with patient: 20 minutes  Past Psychiatric History: See admission H&P  Past Medical History:  Past Medical History:  Diagnosis Date  . Alcohol abuse   . Medical history non-contributory     Past Surgical History:  Procedure Laterality Date  . HAND SURGERY Left   . NO PAST SURGERIES      Family History: History reviewed. No pertinent family history. Family Psychiatric  History: See admission H&P Social History:  Social History   Substance and Sexual Activity  Alcohol Use Yes   Comment: daily      Social History   Substance and Sexual Activity  Drug Use Yes  . Types: Marijuana, Oxycodone    Social History   Socioeconomic History  . Marital status: Single    Spouse name: Not on file  . Number of children: Not on file  . Years of education: Not on file  . Highest education level: Not on file  Occupational History  . Not on file  Social Needs  . Financial resource strain: Not on file  . Food insecurity    Worry: Not on file    Inability: Not on file  . Transportation needs    Medical: Not on file    Non-medical: Not on file  Tobacco Use  . Smoking status: Current Every Day Smoker    Packs/day: 1.00  . Smokeless tobacco: Never Used  Substance and Sexual Activity  . Alcohol use: Yes    Comment: daily   . Drug use: Yes    Types: Marijuana, Oxycodone  . Sexual activity: Not Currently    Birth control/protection: None  Lifestyle  . Physical activity    Days per week: Not on file    Minutes per session: Not on file  . Stress: Not on file  Relationships  . Social connections    Talks on phone: Not on file  Gets together: Not on file    Attends religious service: Not on file    Active member of club or organization: Not on file    Attends meetings of clubs or organizations: Not on file    Relationship status: Not on file  Other Topics Concern  . Not on file  Social History Narrative   ** Merged History Encounter **       Additional Social History:                         Sleep: Good  Appetite:  Good  Current Medications: Current Facility-Administered Medications  Medication Dose Route Frequency Provider Last Rate Last Dose  . acetaminophen (TYLENOL) tablet 650 mg  650 mg Oral Q6H PRN Jearld Leschixon, Rashaun M, NP   650 mg at  01/22/19 2235  . FLUoxetine (PROZAC) capsule 20 mg  20 mg Oral Daily Cobos, Rockey SituFernando A, MD   20 mg at 01/24/19 0819  . gabapentin (NEURONTIN) capsule 100 mg  100 mg Oral TID Cobos, Rockey SituFernando A, MD   100 mg at 01/24/19 0818  . hydrOXYzine (ATARAX/VISTARIL) tablet 25 mg  25 mg Oral Q6H PRN Denzil Magnusonhomas, Lashunda, NP   25 mg at 01/23/19 2145  . LORazepam (ATIVAN) tablet 1 mg  1 mg Oral Q6H PRN Denzil Magnusonhomas, Lashunda, NP   1 mg at 01/23/19 1508  . multivitamin with minerals tablet 1 tablet  1 tablet Oral Daily Denzil Magnusonhomas, Lashunda, NP   1 tablet at 01/24/19 0818  . thiamine (VITAMIN B-1) tablet 100 mg  100 mg Oral Daily Denzil Magnusonhomas, Lashunda, NP   100 mg at 01/24/19 0818  . traZODone (DESYREL) tablet 50 mg  50 mg Oral QHS PRN Antonieta Pertlary,  Lawson, MD        Lab Results:  Results for orders placed or performed during the hospital encounter of 01/22/19 (from the past 48 hour(s))  Hemoglobin A1c     Status: None   Collection Time: 01/23/19  6:25 AM  Result Value Ref Range   Hgb A1c MFr Bld 5.6 4.8 - 5.6 %    Comment: (NOTE) Pre diabetes:          5.7%-6.4% Diabetes:              >6.4% Glycemic control for   <7.0% adults with diabetes    Mean Plasma Glucose 114.02 mg/dL    Comment: Performed at [facility], [address] [place], [place] [address]  Lipid panel     Status: Abnormal   Collection Time: 01/23/19  6:25 AM  Result Value Ref Range   Cholesterol 205 (H) 0 - 200 mg/dL   Triglycerides 40 <621<150 mg/dL   HDL 308119 >65>40 mg/dL   Total CHOL/HDL Ratio 1.7 RATIO   VLDL 8 0 - 40 mg/dL   LDL Cholesterol 78 0 - 99 mg/dL    Comment:        Total Cholesterol/HDL:CHD Risk Coronary Heart Disease Risk Table                     Men   Women  1/2 Average Risk   3.4   3.3  Average Risk       5.0   4.4  2 X Average Risk   9.6   7.1  3 X Average Risk  23.4   11.0        Use the calculated Patient Ratio above and the CHD Risk Table to  determine the patient's CHD Risk.        ATP III CLASSIFICATION (LDL):  <100      mg/dL   Optimal  [id number]  mg/dL   Near or Above                    Optimal  130-159  mg/dL   Borderline  [id number]  mg/dL   High  >811>190     mg/dL   Very High Performed at [facility], [address] [place], [place] [address]   TSH     Status: None   Collection Time: 01/23/19  6:25 AM  Result Value Ref Range   TSH 2.150 0.350 - 4.500 uIU/mL    Comment: Performed by a 3rd Generation assay with a functional sensitivity of <=0.01 uIU/mL. Performed at [facility], [address] [place], [place] [address]     Blood Alcohol level:  Lab Results  Component Value Date   ETH 401 [facility](HH) 01/20/2019   ETH 386 (HH) 01/17/2019    Metabolic Disorder Labs: Lab Results  Component Value Date   HGBA1C 5.6 01/23/2019   MPG 114.02 01/23/2019   No results found for: PROLACTIN Lab Results  Component Value Date   CHOL 205 (H) 01/23/2019   TRIG 40 01/23/2019   HDL 119 01/23/2019   CHOLHDL 1.7 01/23/2019   VLDL 8 01/23/2019   LDLCALC 78 01/23/2019    Physical Findings: AIMS:  , ,  ,  ,    CIWA:  CIWA-Ar Total: 4 COWS:     Musculoskeletal: Strength & Muscle Tone: within normal limits Gait & Station: normal Patient leans: N/A  Psychiatric Specialty Exam: Physical Exam  Nursing note and vitals reviewed. Constitutional: He is oriented to person, place, and time. He appears well-developed and well-nourished.  HENT:  Head: Normocephalic and atraumatic.  Respiratory: Effort normal.  Neurological: He is alert and oriented to person, place, and time.    ROS  Blood pressure (!) 98/57, pulse (!) 112, temperature 97.9 F (36.6 C), temperature source Oral, resp. rate 16.There is no height or weight on file to calculate BMI.  General Appearance: Casual  Eye Contact:  Fair  Speech:  Normal Rate  Volume:  Normal  Mood:  Euthymic  Affect:  Congruent  Thought Process:  Coherent and Descriptions of Associations: Intact  Orientation:  Full (Time, Place,  and Person)  Thought Content:  Logical  Suicidal Thoughts:  No  Homicidal Thoughts:  No  Memory:  Immediate;   Fair Recent;   Fair Remote;   Fair  Judgement:  Intact  Insight:  Fair  Psychomotor Activity:  Normal  Concentration:  Concentration: Fair and Attention Span: Fair  Recall:  [employer]Fair  Fund of Knowledge:  Fair  Language:  Good  Akathisia:  Negative  Handed:  Right  AIMS (if indicated):     Assets:  Desire for Improvement Resilience  ADL's:  Intact  Cognition:  WNL  Sleep:  Number of Hours: 5.75     Treatment Plan Summary: Daily contact with patient to assess and evaluate symptoms and progress in treatment, Medication management and Plan : Patient is seen and examined.  Patient is a 58 year old male with the above-stated past psychiatric history who is seen in follow-up.  Diagnosis: #1 alcohol dependence, #2 alcohol withdrawal, #3 Xanax dependence, #4 benzodiazepine withdrawal, #5 substance-induced mood disorder versus major depression, #6 history of opiate use disorder  Patient is seen in follow-up.  He is  doing better.  We will continue to monitor his withdrawal symptoms.  If things continue to go well, we anticipate discharge in 1 to 3 days. 1.  Continue fluoxetine 20 mg p.o. daily for mood and anxiety. 2.  Continue Neurontin 100 mg p.o. 3 times daily for chronic pain and mood stability. 3.  Continue hydroxyzine 25 mg every 6 hours as needed anxiety. 4.  Stop Ativan. 5.  Continue multivitamin 1 tablet p.o. daily for nutritional supplementation. 6.  Continue thiamine 100 mg p.o. daily for nutritional supplementation. 7.  Continue trazodone 50 mg p.o. nightly as needed insomnia. 8.  Disposition planning-in progress. Antonieta Pert, MD 01/24/2019, 11:27 AM

## 2019-01-25 MED ORDER — CHLORDIAZEPOXIDE HCL 25 MG PO CAPS
25.0000 mg | ORAL_CAPSULE | Freq: Four times a day (QID) | ORAL | Status: DC | PRN
  Administered 2019-01-25 – 2019-01-26 (×3): 25 mg via ORAL
  Filled 2019-01-25 (×3): qty 1

## 2019-01-25 MED ORDER — TRAZODONE HCL 50 MG PO TABS
25.0000 mg | ORAL_TABLET | Freq: Every evening | ORAL | Status: DC | PRN
  Administered 2019-01-25 – 2019-01-26 (×2): 25 mg via ORAL
  Filled 2019-01-25 (×2): qty 1

## 2019-01-25 NOTE — Progress Notes (Signed)
D.  Pt pleasant on approach, no complaints voiced at this time.  Pt was positive for evening wrap up group, observed engaged appropriately with peers on the unit.  Pt denies SI/HI/AVH at this time.  A.  Support and encouragement offered, medication given as ordered  R  Pt remains safe on the unit, will continue to monitor.   

## 2019-01-25 NOTE — Progress Notes (Signed)
Pt presented with a flat affect and depressed mood. Pt reported feeling increasingly depressed and anxious this morning. Pt reported having withdrawal symptoms of cravings and jitters. Pt expressed to Probation officer that he was drinking a lot of alcohol prior to admit. Pt denied SI/HI.  Pt denied AVH. Pt hypotensive this am and the pt was encouraged to increase his fluid intake. Pt b/p reassessed and stable at this time.  Medications reviewed with pt. Verbal support provided. Pt encouraged to attend groups. 15 minute checks performed for safety.   Pt compliant with tx plan.

## 2019-01-25 NOTE — Progress Notes (Signed)
D: Pt was in dayroom upon initial approach.  Pt presents with anxious, depressed affect and mood.  He reports "I've been having some alcohol flare-ups."  Pt complains of cravings, tremor, anxiety.  His goal is to "sleep better, get rid of the cravings."  Pt denies SI/HI, denies hallucinations, denies pain.  Pt has been visible in milieu interacting with peers and staff cautiously.  A: Introduced self to pt.  Met with pt 1:1.  Actively listened to pt and offered support and encouragement.  PRN medication administered for anxiety and sleep.  15 minute safety checks in place.  R: Pt is safe on the unit.  Pt is compliant with medications.  Pt verbally contracts for safety.  Will continue to monitor and assess.

## 2019-01-25 NOTE — Progress Notes (Signed)
[place] Group Notes:  (Nursing/MHT/Case Management/Adjunct)  Date:  01/25/2019  Time:  2130  Type of Therapy:  wrap up group  Participation Level:  Active  Participation Quality:  Appropriate, Attentive, Sharing and Supportive  Affect:  Blunted and Depressed  Cognitive:  Appropriate  Insight:  Improving  Engagement in Group:  Engaged  Modes of Intervention:  Clarification, Education and Support  Summary of Progress/Problems:  Andrew Rivas 01/25/2019, 10:43 PM

## 2019-01-25 NOTE — Progress Notes (Signed)
[facility] MD Progress Note  01/25/2019 9:02 AM Andrew Rivas  MRN:  [id number] Subjective:  "I am still having alcohol cravings."  From H&P note: Patient is a 58 year old male with a known past psychiatric history significant for alcohol dependence, benzodiazepine dependence, opiate use disorder, substance-induced mood disorder versus major depression.  He presented to the [facility] emergency department on 01/17/2019 with suicidal ideation.  His complaint at that time was that he had drank a lot of beer that day, and then had pain across his chest.  During the interview he also stated he had suicidal ideation.  He was admitted to our facility on 01/22/2019.  Patient endorses feeling hopeful today, "things are working out, I am getting my head on straight so I can get home and get back to work." Patient states "my appetite is good." Patient describes energy today as "low, I feel groggy after taking that trazodone last night, I slept all night for the first time in three nights but I think it made my blood pressure drop."   Patient denies SI, HI and AVH today. Patient verbalizes, " I sometimes used to feel like I was hearing and seeing things when I was drinking and taking xanax at the same time."  Principal Problem: MDD (major depressive disorder) Diagnosis: Principal Problem:   MDD (major depressive disorder) Active Problems:   Alcohol abuse  Total Time spent with patient: 20 minutes  Past Psychiatric History: See admission H&P.  Past Medical History:  Past Medical History:  Diagnosis Date  . Alcohol abuse   . Medical history non-contributory     Past Surgical History:  Procedure Laterality Date  . HAND SURGERY Left   . NO PAST SURGERIES     Family History: History reviewed. No pertinent family history. Family Psychiatric  History: See admission H&P Social History:  Social History   Substance and Sexual Activity  Alcohol Use Yes   Comment: daily      Social  History   Substance and Sexual Activity  Drug Use Yes  . Types: Marijuana, Oxycodone    Social History   Socioeconomic History  . Marital status: Single    Spouse name: Not on file  . Number of children: Not on file  . Years of education: Not on file  . Highest education level: Not on file  Occupational History  . Not on file  Social Needs  . Financial resource strain: Not on file  . Food insecurity    Worry: Not on file    Inability: Not on file  . Transportation needs    Medical: Not on file    Non-medical: Not on file  Tobacco Use  . Smoking status: Current Every Day Smoker    Packs/day: 1.00  . Smokeless tobacco: Never Used  Substance and Sexual Activity  . Alcohol use: Yes    Comment: daily   . Drug use: Yes    Types: Marijuana, Oxycodone  . Sexual activity: Not Currently    Birth control/protection: None  Lifestyle  . Physical activity    Days per week: Not on file    Minutes per session: Not on file  . Stress: Not on file  Relationships  . Social Herbalist on phone: Not on file    Gets together: Not on file    Attends religious service: Not on file    Active member of club or organization: Not on file    Attends meetings of clubs  or organizations: Not on file    Relationship status: Not on file  Other Topics Concern  . Not on file  Social History Narrative   ** Merged History Encounter **       Additional Social History:                         Sleep: Good  Appetite:  Good  Current Medications: Current Facility-Administered Medications  Medication Dose Route Frequency Provider Last Rate Last Dose  . acetaminophen (TYLENOL) tablet 650 mg  650 mg Oral Q6H PRN Jearld Leschixon, Rashaun M, NP   650 mg at 01/22/19 2235  . chlordiazePOXIDE (LIBRIUM) capsule 25 mg  25 mg Oral QID PRN Antonieta Pertlary, Greg Lawson, MD      . FLUoxetine (PROZAC) capsule 20 mg  20 mg Oral Daily Cobos, Rockey SituFernando A, MD   20 mg at 01/25/19 0819  . gabapentin (NEURONTIN)  capsule 100 mg  100 mg Oral TID Cobos, Rockey SituFernando A, MD   100 mg at 01/25/19 0819  . hydrOXYzine (ATARAX/VISTARIL) tablet 25 mg  25 mg Oral Q6H PRN Denzil Magnusonhomas, Lashunda, NP   25 mg at 01/24/19 2111  . multivitamin with minerals tablet 1 tablet  1 tablet Oral Daily Denzil Magnusonhomas, Lashunda, NP   1 tablet at 01/25/19 [id number]  . thiamine (VITAMIN B-1) tablet 100 mg  100 mg Oral Daily Denzil Magnusonhomas, Lashunda, NP   100 mg at 01/25/19 0819  . traZODone (DESYREL) tablet 25 mg  25 mg Oral QHS PRN Patrcia Dollyate, Amara Justen L, FNP        Lab Results: No results found for this or any previous visit (from the past 48 hour(s)).  Blood Alcohol level:  Lab Results  Component Value Date   ETH 401 (HH) 01/20/2019   ETH 386 (HH) 01/17/2019    Metabolic Disorder Labs: Lab Results  Component Value Date   HGBA1C 5.6 01/23/2019   MPG 114.02 01/23/2019   No results found for: PROLACTIN Lab Results  Component Value Date   CHOL 205 (H) 01/23/2019   TRIG 40 01/23/2019   HDL 119 01/23/2019   CHOLHDL 1.7 01/23/2019   VLDL 8 01/23/2019   LDLCALC 78 01/23/2019    Physical Findings: AIMS:  , ,  ,  ,    CIWA:  CIWA-Ar Total: 5 COWS:     Musculoskeletal: Strength & Muscle Tone: within normal limits Gait & Station: normal Patient leans: N/A  Psychiatric Specialty Exam: Physical Exam  Nursing note and vitals reviewed. Constitutional: He is oriented to person, place, and time. He appears well-developed.  HENT:  Head: Normocephalic.  Eyes: Pupils are equal, round, and reactive to light.  Neck: Normal range of motion.  Respiratory: Effort normal.  Musculoskeletal: Normal range of motion.  Neurological: He is alert and oriented to person, place, and time.  Skin: Skin is warm and dry.  Psychiatric: His speech is normal and behavior is normal. Judgment and thought content normal. Cognition and memory are normal. He exhibits a depressed mood.    Review of Systems  Constitutional: Negative.   HENT: Negative.   Eyes: Negative.    Respiratory: Negative.   Cardiovascular: Negative.   Gastrointestinal: Negative.   Genitourinary: Negative.   Musculoskeletal: Negative.   Skin: Negative.   Neurological: Negative.   Endo/Heme/Allergies: Negative.   Psychiatric/Behavioral: Positive for depression and substance abuse. The patient has insomnia (Last night was my first night of solid sleep in the last three nights").   All other systems  reviewed and are negative.   Blood pressure 101/78, pulse 80, temperature 97.6 F (36.4 C), temperature source Oral, resp. rate 16.There is no height or weight on file to calculate BMI.  General Appearance: Disheveled  Eye Contact:  Minimal  Speech:  Clear and Coherent  Volume:  Decreased  Mood:  Depressed  Affect:  Depressed  Thought Process:  Coherent and Descriptions of Associations: Intact  Orientation:  Full (Time, Place, and Person)  Thought Content:  WDL  Suicidal Thoughts:  No  Homicidal Thoughts:  No  Memory:  Good  Judgement:  Fair  Insight:  Good  Psychomotor Activity:  Normal  Concentration:  Concentration: Fair and Attention Span: Fair  Recall:  Good  Fund of Knowledge:  Fair  Language:  Good  Akathisia:  No  Handed:  Right  AIMS (if indicated):     Assets:  Communication Skills Desire for Improvement Physical Health Talents/Skills Vocational/Educational  ADL's:  Intact  Cognition:  WNL  Sleep:  Number of Hours: 6.75     Treatment Plan Summary: Daily contact with patient to assess and evaluate symptoms and progress in treatment, Medication management and Plan to decrease dose of trazodone to 25mg . Discussed trazodone with patient, would like to continue as it increased sleep.  Patient encouraged to increase PO fluid intake, encouraged to rise slowly and avoid falls.  The patient will continue to participate in therapeutic milieu. Disposition in progress. Continue: Librium 25mg  QID PRN, Prozac 20mg  daily, Neurontin 100mg  TID, vistaril 25mg  Q6 PRN, DECREASED  trazodone to 25mg  QHS  Patrcia Dolly, FNP 01/25/2019, 9:02 AM

## 2019-01-26 NOTE — Progress Notes (Signed)
[facility] MD Progress Note  01/26/2019 10:45 AM Andrew Rivas  MRN:  [id number] Subjective:  "I'm a little better."  Patient is a 58 year old male with a known past psychiatric history significant for alcohol dependence, benzodiazepine dependence, opiate use disorder, substance-induced mood disorder versus major depression. He presented to the [facility] emergency department on 01/17/2019 with suicidal ideation.  Andrew Rivas found sitting in the dayroom. He reports improving mood and anxiety levels today. He denies withdrawal symptoms other than some agitation and cravings which have decreased in severity. Appetite and sleep have both improved. Trazodone dosage was decreased last night due to daytime sedation, and he reports improved energy today with good sleep overnight. He has been visible in the milieu and participating in groups. He reports having a negative experience at [facility] after previous hospitalization and would like referral elsewhere after discharge. He denies SI/HI/AVH.  Principal Problem: MDD (major depressive disorder) Diagnosis: Principal Problem:   MDD (major depressive disorder) Active Problems:   Alcohol abuse  Total Time spent with patient: 15 minutes  Past Psychiatric History: See admission H&P  Past Medical History:  Past Medical History:  Diagnosis Date  . Alcohol abuse   . Medical history non-contributory     Past Surgical History:  Procedure Laterality Date  . HAND SURGERY Left   . NO PAST SURGERIES     Family History: History reviewed. No pertinent family history. Family Psychiatric  History: See admission H&P Social History:  Social History   Substance and Sexual Activity  Alcohol Use Yes   Comment: daily      Social History   Substance and Sexual Activity  Drug Use Yes  . Types: Marijuana, Oxycodone    Social History   Socioeconomic History  . Marital status: Single    Spouse name: Not on file  . Number of children: Not on  file  . Years of education: Not on file  . Highest education level: Not on file  Occupational History  . Not on file  Social Needs  . Financial resource strain: Not on file  . Food insecurity    Worry: Not on file    Inability: Not on file  . Transportation needs    Medical: Not on file    Non-medical: Not on file  Tobacco Use  . Smoking status: Current Every Day Smoker    Packs/day: 1.00  . Smokeless tobacco: Never Used  Substance and Sexual Activity  . Alcohol use: Yes    Comment: daily   . Drug use: Yes    Types: Marijuana, Oxycodone  . Sexual activity: Not Currently    Birth control/protection: None  Lifestyle  . Physical activity    Days per week: Not on file    Minutes per session: Not on file  . Stress: Not on file  Relationships  . Social Herbalist on phone: Not on file    Gets together: Not on file    Attends religious service: Not on file    Active member of club or organization: Not on file    Attends meetings of clubs or organizations: Not on file    Relationship status: Not on file  Other Topics Concern  . Not on file  Social History Narrative   ** Merged History Encounter **       Additional Social History:  Sleep: Good  Appetite:  Good  Current Medications: Current Facility-Administered Medications  Medication Dose Route Frequency Provider Last Rate Last Dose  . acetaminophen (TYLENOL) tablet 650 mg  650 mg Oral Q6H PRN Jearld Lesch, NP   650 mg at 01/22/19 2235  . chlordiazePOXIDE (LIBRIUM) capsule 25 mg  25 mg Oral QID PRN Antonieta Pert, MD   25 mg at 01/26/19 0817  . FLUoxetine (PROZAC) capsule 20 mg  20 mg Oral Daily Cobos, Rockey Situ, MD   20 mg at 01/26/19 0817  . gabapentin (NEURONTIN) capsule 100 mg  100 mg Oral TID Cobos, Rockey Situ, MD   100 mg at 01/26/19 6599  . multivitamin with minerals tablet 1 tablet  1 tablet Oral Daily Denzil Magnuson, NP   1 tablet at 01/26/19 0817  .  thiamine (VITAMIN B-1) tablet 100 mg  100 mg Oral Daily Denzil Magnuson, NP   100 mg at 01/26/19 0817  . traZODone (DESYREL) tablet 25 mg  25 mg Oral QHS PRN Patrcia Dolly, FNP   25 mg at 01/25/19 2103    Lab Results: No results found for this or any previous visit (from the past 48 hour(s)).  Blood Alcohol level:  Lab Results  Component Value Date   ETH 401 (HH) 01/20/2019   ETH 386 (HH) 01/17/2019    Metabolic Disorder Labs: Lab Results  Component Value Date   HGBA1C 5.6 01/23/2019   MPG 114.02 01/23/2019   No results found for: PROLACTIN Lab Results  Component Value Date   CHOL 205 (H) 01/23/2019   TRIG 40 01/23/2019   HDL 119 01/23/2019   CHOLHDL 1.7 01/23/2019   VLDL 8 01/23/2019   LDLCALC 78 01/23/2019    Physical Findings: AIMS: Facial and Oral Movements Muscles of Facial Expression: None, normal Lips and Perioral Area: None, normal Jaw: None, normal Tongue: None, normal,Extremity Movements Upper (arms, wrists, hands, fingers): None, normal Lower (legs, knees, ankles, toes): None, normal, Trunk Movements Neck, shoulders, hips: None, normal, Overall Severity Severity of abnormal movements (highest score from questions above): None, normal Incapacitation due to abnormal movements: None, normal Patient's awareness of abnormal movements (rate only patient's report): No Awareness, Dental Status Current problems with teeth and/or dentures?: No Does patient usually wear dentures?: No  CIWA:  CIWA-Ar Total: 4 COWS:     Musculoskeletal: Strength & Muscle Tone: within normal limits Gait & Station: normal Patient leans: N/A  Psychiatric Specialty Exam: Physical Exam  Nursing note and vitals reviewed. Constitutional: He is oriented to person, place, and time. He appears well-developed and well-nourished.  Cardiovascular: Normal rate.  Respiratory: Effort normal.  Neurological: He is alert and oriented to person, place, and time.    Review of Systems   Constitutional: Negative.   Respiratory: Negative for cough and shortness of breath.   Cardiovascular: Negative for chest pain.  Gastrointestinal: Negative for abdominal pain, diarrhea, nausea and vomiting.  Neurological: Negative for tremors, sensory change and headaches.  Psychiatric/Behavioral: Positive for depression and substance abuse. Negative for hallucinations and suicidal ideas. The patient is not nervous/anxious and does not have insomnia.     Blood pressure 101/78, pulse (!) 103, temperature 97.6 F (36.4 C), temperature source Oral, resp. rate 16, SpO2 96 %.There is no height or weight on file to calculate BMI.  General Appearance: Fairly Groomed  Eye Contact:  Good  Speech:  Normal Rate  Volume:  Normal  Mood:  Euthymic  Affect:  Congruent  Thought Process:  Coherent  Orientation:  Full (Time, Place, and Person)  Thought Content:  Logical  Suicidal Thoughts:  No  Homicidal Thoughts:  No  Memory:  Immediate;   Good Recent;   Good  Judgement:  Intact  Insight:  Fair  Psychomotor Activity:  Normal  Concentration:  Concentration: Good and Attention Span: Good  Recall:  Fair  Fund of Knowledge:  Fair  Language:  Good  Akathisia:  No  Handed:  Right  AIMS (if indicated):     Assets:  Communication Skills Desire for Improvement Housing Resilience Vocational/Educational  ADL's:  Intact  Cognition:  WNL  Sleep:  Number of Hours: 4.25     Treatment Plan Summary: Daily contact with patient to assess and evaluate symptoms and progress in treatment and Medication management   Continue inpatient hospitalization.  Continue Librium 25 mg PO QID PRN CIWA>10 Continue Prozac 20 mg PO daily for depression/anxiety Continue Neurontin 100 mg PO TID for anxiety Continue thiamine 100 mg PO daily for supplementation Continue trazodone 25 mg PO QHS PRN insomnia  Patient will participate in the therapeutic group milieu.  Discharge disposition in progress.   Aldean BakerJanet E  Rohini Jaroszewski, NP 01/26/2019, 10:45 AM

## 2019-01-26 NOTE — Progress Notes (Signed)
[facility] Group Notes:  (Nursing/MHT/Case Management/Adjunct)  Date:  01/26/2019  Time:  2030  Type of Therapy:  wrap up group  Participation Level:  Active  Participation Quality:  Appropriate, Attentive, Sharing and Supportive  Affect:  Flat  Cognitive:  Appropriate  Insight:  Improving  Engagement in Group:  Engaged  Modes of Intervention:  Clarification, Education and Support  Summary of Progress/Problems:  Shellia Cleverly 01/26/2019, 9:27 PM

## 2019-01-26 NOTE — [facility] Group Notes (Signed)
[facility] LCSW Group Therapy Note   Date and Time: 01/26/2019 @ 10am  Type of Therapy and Topic:  Group Therapy:  Trust and Honesty    Participation Level:  [facility] PARTICIPATION LEVEL: Minimal   Mood: Pleasant    Description of Group:     In this group patients will be asked to explore the value of being honest.  Patients will be guided to discuss their thoughts, feelings, and behaviors related to honesty and trusting in others. Patients will process together how trust and honesty relate to forming relationships with peers, family members, and self. Each patient will be challenged to identify and express feelings of being vulnerable. Patients will discuss reasons why people are dishonest and identify alternative outcomes if one was truthful (to self or others). This group will be process-oriented, with patients participating in exploration of their own experiences, giving and receiving support, and processing challenge from other group members.     Therapeutic Goals:  1.  Patient will identify why honesty is important to relationships and how honesty overall affects relationships.  2.  Patient will identify a situation where they lied or were lied too and the  feelings, thought process, and behaviors surrounding the situation  3.  Patient will identify the meaning of being vulnerable, how that feels, and how that correlates to being honest with self and others.  4.  Patient will identify situations where they could have told the truth, but instead lied and explain reasons of dishonesty.     Summary of Patient Progress:       Patient was engaged throughout group therapy. Patient did not participate much in group but did identify why it's hard for him to be honest which is because of his addiction.        Therapeutic Modalities:    Cognitive Behavioral Therapy  Solution Focused Therapy  Motivational Interviewing  Brief Therapy   Ardelle Anton, MSW, LCSW

## 2019-01-26 NOTE — Progress Notes (Signed)
Pt presented with a flat affect and a depressed mood. Pt rated depression 9/10, anxiety 8/10, and hopelessness 8/10. Pt denied SI/HI. Pt reported withdrawal symptoms of cravings and agitation. Pt reported fair sleep at bedtime. Pt stated goal, "find a place to stay and get my cravings under control."   Medications administered as ordered per MD. Verbal support provided. Pt encouraged to attend groups. 15 minute checks performed for safety.   Pt compliant with tx plan.

## 2019-01-27 MED ORDER — GABAPENTIN 300 MG PO CAPS: 300.0000 mg | ORAL_CAPSULE | Freq: Three times a day (TID) | ORAL | 2 refills | Status: DC

## 2019-01-27 MED ORDER — NALTREXONE HCL 50 MG PO TABS
50.0000 mg | ORAL_TABLET | Freq: Every day | ORAL | Status: DC
  Filled 2019-01-27 (×2): qty 7

## 2019-01-27 MED ORDER — HYDROXYZINE HCL 50 MG PO TABS
50.0000 mg | ORAL_TABLET | Freq: Three times a day (TID) | ORAL | Status: DC | PRN
  Filled 2019-01-27: qty 10

## 2019-01-27 MED ORDER — OMEPRAZOLE 40 MG PO CPDR: 40.0000 mg | DELAYED_RELEASE_CAPSULE | Freq: Every day | ORAL | 3 refills | Status: DC

## 2019-01-27 MED ORDER — FLUOXETINE HCL 20 MG PO CAPS: 20.0000 mg | ORAL_CAPSULE | Freq: Every day | ORAL | 1 refills | Status: DC

## 2019-01-27 MED ORDER — GABAPENTIN 300 MG PO CAPS
300.0000 mg | ORAL_CAPSULE | Freq: Three times a day (TID) | ORAL | Status: DC
  Filled 2019-01-27 (×3): qty 21

## 2019-01-27 MED ORDER — NALTREXONE HCL 50 MG PO TABS: 50.0000 mg | ORAL_TABLET | Freq: Every day | ORAL | 1 refills | Status: DC

## 2019-01-27 MED ORDER — HYDROXYZINE PAMOATE 50 MG PO CAPS: 50.0000 mg | ORAL_CAPSULE | Freq: Three times a day (TID) | ORAL | 2 refills | Status: DC | PRN

## 2019-01-27 NOTE — Progress Notes (Signed)
Discharge Note:  Patient discharged, riding bus.  Patient denied SI and HI.  Denied A/V hallucinations.  Suicide prevention information given and discussed with patient who stated he understood and had no questions.  Patient stated he appreciated all assistance received from [facility] staff.  Patient stated he received all his belongings, clothing, etc.  All required discharge information given to patient at discharge.

## 2019-01-27 NOTE — [facility] Suicide Risk Assessment (Signed)
[facility] Discharge Suicide Risk Assessment   Principal Problem: MDD (major depressive disorder) Discharge Diagnoses: Principal Problem:   MDD (major depressive disorder) Active Problems:   Alcohol abuse   Total Time spent with patient: 45 minutes  Musculoskeletal: Strength & Muscle Tone: within normal limits Gait & Station: normal Patient leans: N/A  Psychiatric Specialty Exam: ROS  Blood pressure (!) 86/74, pulse (!) 116, temperature 98.4 F (36.9 C), temperature source Oral, resp. rate 16, SpO2 99 %.There is no height or weight on file to calculate BMI.  General Appearance: Casual  Eye Contact::  Good  Speech:  Clear and [id number]  Volume:  Normal  Mood:  Dysphoric  Affect:  Constricted and Depressed  Thought Process:  Coherent  Orientation:  Full (Time, Place, and Person)  Thought Content:  Logical  Suicidal Thoughts:  No  Homicidal Thoughts:  No  Memory:  Immediate;   Fair Recent;   Fair  Judgement:  Intact  Insight:  Fair  Psychomotor Activity:  Normal  Concentration:  Fair  Recall:  [employer] of Knowledge:Fair  Language: Fair  Akathisia:  Negative  Handed:  Right  AIMS (if indicated):     Assets:  Communication Skills Physical Health Resilience  Sleep:  Number of Hours: 5.25  Cognition: WNL  ADL's:  Intact   Mental Status Per Nursing Assessment::   On Admission:  Suicidal ideation indicated by patient, Suicide plan  Demographic Factors:  Male  Loss Factors: Decrease in vocational status  Historical Factors: NA  Risk Reduction Factors:   Sense of responsibility to family and Religious beliefs about death  Continued Clinical Symptoms:  Alcohol/Substance Abuse/Dependencies  Cognitive Features That Contribute To Risk:  Negative, has generally engaged in cognitive therapy  Suicide Risk:  Minimal: No identifiable suicidal ideation.  Patients presenting with no risk factors but with morbid ruminations; may be classified as minimal risk based on  the severity of the depressive symptoms  Follow-up Information    [facility] Follow up on 01/30/2019.   Why: Hospital follow up appointment is Thursday, 9/10 at 9:30a.  The provider will contact you for appointment.  Contact information: [address] [place] [address] [phone]           Plan Of Care/Follow-up recommendations:  Diet:  nl  Johnn Hai, MD 01/27/2019, 9:07 AM

## 2019-01-27 NOTE — [facility] Suicide Risk Assessment (Signed)
[facility] INPATIENT:  Family/Significant Other Suicide Prevention Education  Suicide Prevention Education:  Patient Refusal for Family/Significant Other Suicide Prevention Education: The patient Andrew Rivas has refused to provide written consent for family/significant other to be provided Family/Significant Other Suicide Prevention Education during admission and/or prior to discharge.  Physician notified.  Trecia Rogers 01/27/2019, 9:48 AM

## 2019-01-27 NOTE — Progress Notes (Signed)
  [facility] Adult Case Management Discharge Plan :  Will you be returning to the same living situation after discharge:  Yes,  home At discharge, do you have transportation home?: Yes,  bus Do you have the ability to pay for your medications: No.; [facility]  Release of information consent forms completed and in the chart;  Patient's signature needed at discharge.  Patient to Follow up at: Follow-up Information    [facility] Follow up on 01/30/2019.   Why: Hospital follow up appointment is Thursday, 9/10 at 9:30a.  The provider will contact you for appointment.  Contact information: [address] [place] [address] [phone]           Next level of care provider has access to [place] and Suicide Prevention discussed: Yes,  with pt     Has patient been referred to the Quitline?: Patient refused referral  Patient has been referred for addiction treatment: Yes  Trecia Rogers, LCSW 01/27/2019, 9:49 AM

## 2019-01-27 NOTE — Progress Notes (Signed)
Recreation Therapy Notes  Date:  9.7.20 Time: 0930 Location: [address] Dayroom  Group Topic: Stress Management  Goal Area(s) Addresses:  Patient will identify positive stress management techniques. Patient will identify benefits of using stress management post d/c.  Behavioral Response: Engaged  Intervention:  Stress Management  Activity :  Meditation.  LRT introduced the stress management technique of meditation.  LRT played a meditation that focused on making the most of your day and possibilities the day holds.  Education:  Stress Management, Discharge Planning.   Education Outcome: Acknowledges Education  Clinical Observations/Feedback:  Pt attended and participated in group.     Victorino Sparrow, LRT/CTRS         Victorino Sparrow A 01/27/2019 10:43 AM

## 2019-01-27 NOTE — Progress Notes (Signed)
Patient denied SI and HI, contracts for safety.  Denied A/V hallucinations. Medications administered per MD orders.  Emotional support and hallucinations.  Safety maintained with 15 minute checks.

## 2019-01-27 NOTE — Discharge Summary (Signed)
Physician Discharge Summary Note  Patient:  Andrew Rivas is an 58 y.o., male MRN:  [id number] DOB:  02-Aug-1960 Patient phone:  [phone] (home)  Patient address:   [address] [place] [address],  Total Time spent with patient: 15 minutes  Date of Admission:  01/22/2019 Date of Discharge: 01/27/19  Reason for Admission:  Polysubstance abuse with suicidal ideation  Principal Problem: MDD (major depressive disorder) Discharge Diagnoses: Principal Problem:   MDD (major depressive disorder) Active Problems:   Alcohol abuse   Past Psychiatric History: No prior psychiatric admissions. Denies prior suicide attempts . Endorses brief psychotic symptoms in the past but only during substance intoxication, not otherwise. Endorses history of depression, exacerbated by substance abuse, but states he has depression even when abstinent. Denies history of mania or hypomania. Denies history of violence.  Past Medical History:  Past Medical History:  Diagnosis Date  . Alcohol abuse   . Medical history non-contributory     Past Surgical History:  Procedure Laterality Date  . HAND SURGERY Left   . NO PAST SURGERIES     Family History: History reviewed. No pertinent family history. Family Psychiatric  History: mother had history of depression, anxiety. Father had history of alcohol use disorder . No suicides in family Social History:  Social History   Substance and Sexual Activity  Alcohol Use Yes   Comment: daily      Social History   Substance and Sexual Activity  Drug Use Yes  . Types: Marijuana, Oxycodone    Social History   Socioeconomic History  . Marital status: Single    Spouse name: Not on file  . Number of children: Not on file  . Years of education: Not on file  . Highest education level: Not on file  Occupational History  . Not on file  Social Needs  . Financial resource strain: Not on file  . Food insecurity    Worry: Not on file    Inability: Not on  file  . Transportation needs    Medical: Not on file    Non-medical: Not on file  Tobacco Use  . Smoking status: Current Every Day Smoker    Packs/day: 1.00  . Smokeless tobacco: Never Used  Substance and Sexual Activity  . Alcohol use: Yes    Comment: daily   . Drug use: Yes    Types: Marijuana, Oxycodone  . Sexual activity: Not Currently    Birth control/protection: None  Lifestyle  . Physical activity    Days per week: Not on file    Minutes per session: Not on file  . Stress: Not on file  Relationships  . Social Herbalist on phone: Not on file    Gets together: Not on file    Attends religious service: Not on file    Active member of club or organization: Not on file    Attends meetings of clubs or organizations: Not on file    Relationship status: Not on file  Other Topics Concern  . Not on file  Social History Narrative   ** Merged History Encounter [facility] Course:  From admission H&P: 58 year old male, presented to ED via EMS on 8/28 after being found in a parking lot,  reporting heavy drinking and chest pain. He does not know who called EMS. At the time was medically cleared and discharged. Presented to ED again on 8/31, for similar presentation. Patient's  memory of events is fragmented Reportedly told EMS that he was suicidal. Today does endorse recent suicidal ideations and states " I think I took a combination of oxycodone , Xanax " and had also been drinking heavily/ regularly.He does report recent depression, and endorses neuro vegetative symptoms as below. Reports stressors include financial stressors and being estranged from his family. Reports he has been abusing Xanax, Opiate analgesics intermittently and drinking daily, heavily, up to several 40 ounce beers per day. Admission UDS 401, UDS negative.  Mr. Robel was admitted for polysubstance abuse with suicidal ideation. He reported alcohol, benzodiazepine and opioid abuse. He remained on  the [facility] unit for five days. CIWA protocol was started with Librium PRN CIWA>10 for ETOH/BZD withdrawal. Prozac was started. He participated in group therapy on the unit. He responded well to treatment with no adverse effects reported. He has shown improved mood, affect, sleep, and interactions. He denies any SI/HI/AVH and contracts for safety. On day of discharge, he presents with euthymic affect and future-oriented. He states motivation for follow-up treatment and desire to abstain from drugs and alcohol. He denies withdrawal symptoms. He is discharging on the medications listed below. He agrees to follow up at [facility] (see below). He is provided with prescriptions for medications upon discharge. He is discharging home via bus.   Physical Findings: AIMS: Facial and Oral Movements Muscles of Facial Expression: None, normal Lips and Perioral Area: None, normal Jaw: None, normal Tongue: None, normal,Extremity Movements Upper (arms, wrists, hands, fingers): None, normal Lower (legs, knees, ankles, toes): None, normal, Trunk Movements Neck, shoulders, hips: None, normal, Overall Severity Severity of abnormal movements (highest score from questions above): None, normal Incapacitation due to abnormal movements: None, normal Patient's awareness of abnormal movements (rate only patient's report): No Awareness, Dental Status Current problems with teeth and/or dentures?: No Does patient usually wear dentures?: No  CIWA:  CIWA-Ar Total: 1 COWS:  COWS Total Score: 1  Musculoskeletal: Strength & Muscle Tone: within normal limits Gait & Station: normal Patient leans: N/A  Psychiatric Specialty Exam: Physical Exam  Nursing note and vitals reviewed. Constitutional: He is oriented to person, place, and time. He appears well-developed and well-nourished.  Cardiovascular: Normal rate.  Respiratory: Effort normal.  Neurological: He is alert and oriented to person, place, and time.    Review of Systems   Constitutional: Negative.   Psychiatric/Behavioral: Positive for depression (stable on medication) and substance abuse (ETOH, BZDs, opioids). Negative for hallucinations and suicidal ideas. The patient is not nervous/anxious and does not have insomnia.     Blood pressure 100/70, pulse 90, temperature 98.4 F (36.9 C), temperature source Oral, resp. rate 16, SpO2 99 %.There is no height or weight on file to calculate BMI.  See MD's discharge SRA      Has this patient used any form of tobacco in the last 30 days? (Cigarettes, Smokeless Tobacco, Cigars, and/or Pipes)  No  Blood Alcohol level:  Lab Results  Component Value Date   ETH 401 (HH) 01/20/2019   ETH 386 (HH) 01/17/2019    Metabolic Disorder Labs:  Lab Results  Component Value Date   HGBA1C 5.6 01/23/2019   MPG 114.02 01/23/2019   No results found for: PROLACTIN Lab Results  Component Value Date   CHOL 205 (H) 01/23/2019   TRIG 40 01/23/2019   HDL 119 01/23/2019   CHOLHDL 1.7 01/23/2019   VLDL 8 01/23/2019   LDLCALC 78 01/23/2019    See Psychiatric Specialty Exam and Suicide Risk Assessment  completed by Attending Physician prior to discharge.  Discharge destination:  Home  Is patient on multiple antipsychotic therapies at discharge:  No   Has Patient had three or more failed trials of antipsychotic monotherapy by history:  No  Recommended Plan for Multiple Antipsychotic Therapies: NA   Allergies as of 01/27/2019   No Known Allergies     Medication List    TAKE these medications     Indication  FLUoxetine 20 MG capsule Commonly known as: PROZAC Take 1 capsule (20 mg total) by mouth daily. Start taking on: January 28, 2019  Indication: Depression   gabapentin 300 MG capsule Commonly known as: NEURONTIN Take 1 capsule (300 mg total) by mouth 3 (three) times daily.  Indication: Abuse or Misuse of Alcohol   hydrOXYzine 50 MG capsule Commonly known as: Vistaril Take 1 capsule (50 mg total) by mouth 3  (three) times daily as needed.  Indication: Feeling Anxious   naltrexone 50 MG tablet Commonly known as: DEPADE Take 1 tablet (50 mg total) by mouth daily.  Indication: Abuse or Misuse of Alcohol   omeprazole 40 MG capsule Commonly known as: PriLOSEC Take 1 capsule (40 mg total) by mouth daily.  Indication: Gastroesophageal Reflux Disease      Follow-up Information    [facility] Follow up on 01/30/2019.   Why: Hospital follow up appointment is Thursday, 9/10 at 9:30a.  The provider will contact you for appointment.  Contact information: [address] [place] [id number] [phone]           Follow-up recommendations: Activity as tolerated. Diet as recommended by primary care physician. Keep all scheduled follow-up appointments as recommended.   Comments:   Patient is instructed to take all prescribed medications as recommended. Report any side effects or adverse reactions to your outpatient psychiatrist. Patient is instructed to abstain from alcohol and illegal drugs while on prescription medications. In the event of worsening symptoms, patient is instructed to call the crisis hotline, 911, or go to the nearest emergency department for evaluation and treatment.  Signed: Aldean BakerJanet E Elicia Lui, NP 01/27/2019, 10:15 AM

## 2019-01-29 ENCOUNTER — Emergency Department (HOSPITAL_COMMUNITY)
Admission: EM | Admit: 2019-01-29 | Discharge: 2019-01-30 | Disposition: A | Discharge status: Home / Self Care | Payer: Self-pay | Attending: Emergency Medicine | Admitting: Emergency Medicine

## 2019-01-29 ENCOUNTER — Other Ambulatory Visit: Payer: Self-pay

## 2019-01-29 ENCOUNTER — Encounter (HOSPITAL_COMMUNITY): Payer: Self-pay | Admitting: Emergency Medicine

## 2019-01-29 DIAGNOSIS — F10920 Alcohol use, unspecified with intoxication, uncomplicated: Secondary | ICD-10-CM

## 2019-01-29 DIAGNOSIS — Y908 Blood alcohol level of 240 mg/100 ml or more: Secondary | ICD-10-CM | POA: Insufficient documentation

## 2019-01-29 DIAGNOSIS — F17210 Nicotine dependence, cigarettes, uncomplicated: Secondary | ICD-10-CM | POA: Insufficient documentation

## 2019-01-29 DIAGNOSIS — F10929 Alcohol use, unspecified with intoxication, unspecified: Secondary | ICD-10-CM | POA: Insufficient documentation

## 2019-01-29 LAB — CBC WITH DIFFERENTIAL/PLATELET
Abs Immature Granulocytes: 0.06 10*3/uL (ref 0.00–0.07)
Basophils Absolute: 0.1 10*3/uL (ref 0.0–0.1)
Basophils Relative: 1 %
Eosinophils Absolute: 0.2 10*3/uL (ref 0.0–0.5)
Eosinophils Relative: 3 %
HCT: 42.1 % (ref 39.0–52.0)
Hemoglobin: 14.3 g/dL (ref 13.0–17.0)
Immature Granulocytes: 1 %
Lymphocytes Relative: 48 %
Lymphs Abs: 3.3 10*3/uL (ref 0.7–4.0)
MCH: 31.7 pg (ref 26.0–34.0)
MCHC: 34 g/dL (ref 30.0–36.0)
MCV: 93.3 fL (ref 80.0–100.0)
Monocytes Absolute: 0.6 10*3/uL (ref 0.1–1.0)
Monocytes Relative: 9 %
Neutro Abs: 2.6 10*3/uL (ref 1.7–7.7)
Neutrophils Relative %: 38 %
Platelets: 275 10*3/uL (ref 150–400)
RBC: 4.51 MIL/uL (ref 4.22–5.81)
RDW: 13.8 % (ref 11.5–15.5)
WBC: 6.8 10*3/uL (ref 4.0–10.5)
nRBC: 0 % (ref 0.0–0.2)

## 2019-01-29 LAB — COMPREHENSIVE METABOLIC PANEL
ALT: 35 U/L (ref 0–44)
AST: 32 U/L (ref 15–41)
Albumin: 4.1 g/dL (ref 3.5–5.0)
Alkaline Phosphatase: 68 U/L (ref 38–126)
Anion gap: 10 (ref 5–15)
BUN: 13 mg/dL (ref 6–20)
CO2: 25 mmol/L (ref 22–32)
Calcium: 8.6 mg/dL — ABNORMAL LOW (ref 8.9–10.3)
Chloride: 104 mmol/L (ref 98–111)
Creatinine, Ser: 0.73 mg/dL (ref 0.61–1.24)
GFR calc Af Amer: >60 mL/min (ref >60)
GFR calc non Af Amer: >60 mL/min (ref >60)
Glucose, Bld: 82 mg/dL (ref 70–99)
Potassium: 3.9 mmol/L (ref 3.5–5.1)
Sodium: 139 mmol/L (ref 135–145)
Total Bilirubin: 0.5 mg/dL (ref 0.3–1.2)
Total Protein: 7.4 g/dL (ref 6.5–8.1)

## 2019-01-29 LAB — SALICYLATE LEVEL: Salicylate Lvl: <7 mg/dL (ref 2.8–30.0)

## 2019-01-29 LAB — ACETAMINOPHEN LEVEL: Acetaminophen (Tylenol), Serum: <10 ug/mL — ABNORMAL LOW (ref 10–30)

## 2019-01-29 LAB — ETHANOL: Alcohol, Ethyl (B): 287 mg/dL — ABNORMAL HIGH (ref <10)

## 2019-01-29 NOTE — ED Notes (Signed)
Police at bedside to give pt citation.

## 2019-01-29 NOTE — ED Triage Notes (Signed)
Pt found at local [place] passed out in the parking lot, has been previously banned from this location. Pt under the influence of ETOH. Pt arrived in C-collar, but EMS noticed no obvious injury except an abrasion to his right knee. Vitals and EKG WNL, pt AOx0, and non combative.

## 2019-01-29 NOTE — ED Notes (Signed)
Pt ambulated independently to the bathroom. Given blanket and a mask.

## 2019-01-29 NOTE — ED Notes (Signed)
Pt resting well. Pt not in any distress
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
## 2019-03-28 ENCOUNTER — Encounter (HOSPITAL_COMMUNITY): Payer: Self-pay

## 2019-03-28 ENCOUNTER — Other Ambulatory Visit: Payer: Self-pay

## 2019-03-28 ENCOUNTER — Emergency Department (HOSPITAL_COMMUNITY)
Admission: EM | Admit: 2019-03-28 | Discharge: 2019-03-30 | Disposition: A | Discharge status: Other Acute Inpatient Hospital | Payer: Self-pay | Attending: Emergency Medicine | Admitting: Emergency Medicine

## 2019-03-28 DIAGNOSIS — F10220 Alcohol dependence with intoxication, uncomplicated: Secondary | ICD-10-CM | POA: Insufficient documentation

## 2019-03-28 DIAGNOSIS — Z20828 Contact with and (suspected) exposure to other viral communicable diseases: Secondary | ICD-10-CM | POA: Insufficient documentation

## 2019-03-28 DIAGNOSIS — Z008 Encounter for other general examination: Secondary | ICD-10-CM | POA: Insufficient documentation

## 2019-03-28 DIAGNOSIS — F1994 Other psychoactive substance use, unspecified with psychoactive substance-induced mood disorder: Secondary | ICD-10-CM | POA: Insufficient documentation

## 2019-03-28 DIAGNOSIS — F10920 Alcohol use, unspecified with intoxication, uncomplicated: Secondary | ICD-10-CM

## 2019-03-28 DIAGNOSIS — F17210 Nicotine dependence, cigarettes, uncomplicated: Secondary | ICD-10-CM | POA: Insufficient documentation

## 2019-03-28 DIAGNOSIS — F333 Major depressive disorder, recurrent, severe with psychotic symptoms: Secondary | ICD-10-CM | POA: Insufficient documentation

## 2019-03-28 DIAGNOSIS — F1010 Alcohol abuse, uncomplicated: Secondary | ICD-10-CM | POA: Diagnosis present

## 2019-03-28 DIAGNOSIS — Z79899 Other long term (current) drug therapy: Secondary | ICD-10-CM | POA: Insufficient documentation

## 2019-03-28 DIAGNOSIS — F339 Major depressive disorder, recurrent, unspecified: Secondary | ICD-10-CM | POA: Diagnosis present

## 2019-03-28 DIAGNOSIS — R45851 Suicidal ideations: Secondary | ICD-10-CM | POA: Insufficient documentation

## 2019-03-28 LAB — COMPREHENSIVE METABOLIC PANEL
ALT: 23 U/L (ref 0–44)
AST: 28 U/L (ref 15–41)
Albumin: 4.2 g/dL (ref 3.5–5.0)
Alkaline Phosphatase: 58 U/L (ref 38–126)
Anion gap: 12 (ref 5–15)
BUN: 7 mg/dL (ref 6–20)
CO2: 21 mmol/L — ABNORMAL LOW (ref 22–32)
Calcium: 8.5 mg/dL — ABNORMAL LOW (ref 8.9–10.3)
Chloride: 106 mmol/L (ref 98–111)
Creatinine, Ser: 0.56 mg/dL — ABNORMAL LOW (ref 0.61–1.24)
GFR calc Af Amer: >60 mL/min (ref >60)
GFR calc non Af Amer: >60 mL/min (ref >60)
Glucose, Bld: 76 mg/dL (ref 70–99)
Potassium: 3.8 mmol/L (ref 3.5–5.1)
Sodium: 139 mmol/L (ref 135–145)
Total Bilirubin: 0.6 mg/dL (ref 0.3–1.2)
Total Protein: 7.4 g/dL (ref 6.5–8.1)

## 2019-03-28 LAB — ETHANOL: Alcohol, Ethyl (B): 378 mg/dL (ref <10)

## 2019-03-28 LAB — CBC
HCT: 44 % (ref 39.0–52.0)
Hemoglobin: 14.8 g/dL (ref 13.0–17.0)
MCH: 31.2 pg (ref 26.0–34.0)
MCHC: 33.6 g/dL (ref 30.0–36.0)
MCV: 92.8 fL (ref 80.0–100.0)
Platelets: 290 10*3/uL (ref 150–400)
RBC: 4.74 MIL/uL (ref 4.22–5.81)
RDW: 13.9 % (ref 11.5–15.5)
WBC: 7 10*3/uL (ref 4.0–10.5)
nRBC: 0 % (ref 0.0–0.2)

## 2019-03-28 LAB — RAPID URINE DRUG SCREEN, HOSP PERFORMED
Amphetamines: NOT DETECTED
Barbiturates: NOT DETECTED
Benzodiazepines: NOT DETECTED
Cocaine: NOT DETECTED
Opiates: NOT DETECTED
Tetrahydrocannabinol: NOT DETECTED

## 2019-03-28 NOTE — ED Notes (Signed)
Urine culture sent to lab with urine sample.  
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
## 2019-03-28 NOTE — ED Triage Notes (Addendum)
[place]ers called EMS for an unresponsive [place] found [place]. Pt intoxicated with unknown amount of alcohol. Pt is alert and protecting airway at this time. During triage Pt endorses SI. Pt states " I want to blow my fucking head off"

## 2019-03-29 MED ORDER — FLUOXETINE HCL 20 MG PO CAPS
20.0000 mg | ORAL_CAPSULE | Freq: Every day | ORAL | Status: DC
  Administered 2019-03-29 – 2019-03-30 (×2): 20 mg via ORAL
  Filled 2019-03-29 (×2): qty 1

## 2019-03-29 MED ORDER — LORAZEPAM 2 MG/ML IJ SOLN: 0.0000 mg | Freq: Four times a day (QID) | INTRAMUSCULAR | Status: DC

## 2019-03-29 MED ORDER — THIAMINE HCL 100 MG/ML IJ SOLN
100.0000 mg | Freq: Every day | INTRAMUSCULAR | Status: DC
  Filled 2019-03-29: qty 2

## 2019-03-29 MED ORDER — LORAZEPAM 1 MG PO TABS: 0.0000 mg | ORAL_TABLET | Freq: Two times a day (BID) | ORAL | Status: DC

## 2019-03-29 MED ORDER — LORAZEPAM 2 MG/ML IJ SOLN: 0.0000 mg | Freq: Two times a day (BID) | INTRAMUSCULAR | Status: DC

## 2019-03-29 MED ORDER — HYDROXYZINE HCL 25 MG PO TABS: 50.0000 mg | ORAL_TABLET | Freq: Three times a day (TID) | ORAL | Status: DC | PRN

## 2019-03-29 MED ORDER — LORAZEPAM 1 MG PO TABS
0.0000 mg | ORAL_TABLET | Freq: Four times a day (QID) | ORAL | Status: DC
  Administered 2019-03-30: 12:00:00 1 mg via ORAL
  Filled 2019-03-29: qty 1

## 2019-03-29 MED ORDER — PANTOPRAZOLE SODIUM 40 MG PO TBEC
40.0000 mg | DELAYED_RELEASE_TABLET | Freq: Every day | ORAL | Status: DC
  Administered 2019-03-29 – 2019-03-30 (×2): 40 mg via ORAL
  Filled 2019-03-29 (×2): qty 1

## 2019-03-29 MED ORDER — NALTREXONE HCL 50 MG PO TABS
50.0000 mg | ORAL_TABLET | Freq: Every day | ORAL | Status: DC
  Administered 2019-03-29 – 2019-03-30 (×2): 50 mg via ORAL
  Filled 2019-03-29 (×2): qty 1

## 2019-03-29 MED ORDER — GABAPENTIN 300 MG PO CAPS
300.0000 mg | ORAL_CAPSULE | Freq: Three times a day (TID) | ORAL | Status: DC
  Administered 2019-03-29 – 2019-03-30 (×4): 300 mg via ORAL
  Filled 2019-03-29 (×4): qty 1

## 2019-03-29 MED ORDER — VITAMIN B-1 100 MG PO TABS
100.0000 mg | ORAL_TABLET | Freq: Every day | ORAL | Status: DC
  Administered 2019-03-29 – 2019-03-30 (×2): 100 mg via ORAL
  Filled 2019-03-29 (×2): qty 1

## 2019-03-29 NOTE — BH Assessment (Signed)
Tele Assessment Note   Patient Name: Andrew Rivas MRN: [id number] Referring Physician: Ward, Delice Bison, DO Location of Patient: [facility] Location of Provider: [place]  Andrew Rivas is an 58 y.o. male who presents to the ED voluntarily. Pt reports he does not recall how he came to the ED. Per reports, pt was seen by bystanders in a shopping [place], lying on the ground, unresponsive. EMS brought the pt to the ED. Pt states he is feeling suicidal and wants to "take a bunch of pills." Pt reported to EDP that he wanted to "blow his head off." Pt states he has attempted suicide in the past by OD on medication. Pt denies any precipitating events causing him to feel suicidal. Pt states he has been feeling this way for the past day. Pt endorses AH with command that tell him to kill himself.  Pt denies any support, collaterals, or OPT MH providers. Pt has been admitted to [facility] multiple times in the past due to depression and alcohol dependence. Pt has multiple ED visits c/o alcohol abuse. Pt's BAL 378 on arrival to ED. Pt states he is working and he lives in a house with 2 roommates. Pt states he does not have any family or other supports.   Andrew Romp, NP recommends continued OBS pending AM psych assessment due to hx of suicide attempts and pt's ongoing SI with plan. Pt's nurse Garen Grams, RN and EDP Ward, Delice Bison, DO have been advised.  Diagnosis: MDD, recurrent, severe, w/ psychosis; Alcohol use d/o, severe  Past Medical History:  Past Medical History:  Diagnosis Date  . Alcohol abuse   . Medical history non-contributory     Past Surgical History:  Procedure Laterality Date  . HAND SURGERY Left   . NO PAST SURGERIES      Family History: History reviewed. No pertinent family history.  Social History:  reports that he has been smoking. He has been smoking about 1.00 pack per day. He has never used smokeless tobacco. He reports current alcohol use. He reports current  drug use. Drugs: Marijuana and Oxycodone.  Additional Social History:  Alcohol / Drug Use Pain Medications: See MAR Prescriptions: See MAR Over the Counter: See MAR History of alcohol / drug use?: Yes Longest period of sobriety (when/how long): 8 months Negative Consequences of Use: Personal relationships, Legal Withdrawal Symptoms: Patient aware of relationship between substance abuse and physical/medical complications, Blackouts Substance #1 Name of Substance 1: Alcohol 1 - Age of First Use: 21 1 - Amount (size/oz): excessive 1 - Frequency: daily 1 - Duration: ongoing 1 - Last Use / Amount: 03/28/19  CIWA: CIWA-Ar BP: 121/80 Pulse Rate: 78 COWS:    Allergies: No Known Allergies  Home Medications: (Not in a hospital admission)   OB/GYN Status:  No LMP for male patient.  General Assessment Data Assessment unable to be completed: Yes Reason for not completing assessment: TTS CALLING TCU, NO ANSWER Location of Assessment: [facility] TTS Assessment: In system Is this a Tele or Face-to-Face Assessment?: Tele Assessment Is this an Initial Assessment or a Re-assessment for this encounter?: Initial Assessment Patient Accompanied by:: N/A Language Other than English: No Living Arrangements: Other (Comment) What gender do you identify as?: Male Marital status: Single Pregnancy Status: No Living Arrangements: Non-relatives/Friends(2 roommates) Can pt return to current living arrangement?: Yes Admission Status: Voluntary Is patient capable of signing voluntary admission?: Yes Referral Source: Self/Family/Friend Insurance type: none     Crisis Care Plan Living  Arrangements: Non-relatives/Friends(2 roommates) Name of Psychiatrist: none Name of Therapist: none  Education Status Is patient currently in school?: No Is the patient employed, unemployed or receiving disability?: Employed  Risk to self with the past 6 months Suicidal Ideation: Yes-Currently Present Has patient  been a risk to self within the past 6 months prior to admission? : Yes Suicidal Intent: Yes-Currently Present Has patient had any suicidal intent within the past 6 months prior to admission? : Yes Is patient at risk for suicide?: Yes Suicidal Plan?: Yes-Currently Present Has patient had any suicidal plan within the past 6 months prior to admission? : Yes Specify Current Suicidal Plan: pt states he has a plan to OD on medication Access to Means: Yes Specify Access to Suicidal Means: pt states he has access to medication What has been your use of drugs/alcohol within the last 12 months?: alcohol Previous Attempts/Gestures: Yes How many times?: 1 Other Self Harm Risks: hx of suicide attempt Triggers for Past Attempts: Unpredictable Intentional Self Injurious Behavior: None Family Suicide History: No Recent stressful life event(s): Legal Issues, Other (Comment)(substance abuse) Persecutory voices/beliefs?: Yes Depression: Yes Depression Symptoms: Feeling angry/irritable, Feeling worthless/self pity, Insomnia, Loss of interest in usual pleasures Substance abuse history and/or treatment for substance abuse?: Yes Suicide prevention information given to non-admitted patients: Not applicable  Risk to Others within the past 6 months Homicidal Ideation: No Does patient have any lifetime risk of violence toward others beyond the six months prior to admission? : No Thoughts of Harm to Others: No Current Homicidal Intent: No Current Homicidal Plan: No Access to Homicidal Means: No History of harm to others?: No Assessment of Violence: None Noted Does patient have access to weapons?: Yes (Comment)(pt states he can get a gun if he wants) Criminal Charges Pending?: Yes Describe Pending Criminal Charges: trespassing, DUI, disorderly conduct Does patient have a court date: Yes Court Date: (January 2021) Is patient on probation?: No  Psychosis Hallucinations: Auditory, With command Delusions:  None noted  Mental Status Report Appearance/Hygiene: Disheveled Eye Contact: Poor(pt talks while blanket is covering his face) Motor Activity: Freedom of movement Speech: Slurred Level of Consciousness: Quiet/awake Mood: Depressed, Helpless, Worthless, low self-esteem Affect: Depressed, Flat, Blunted Anxiety Level: None Thought Processes: Relevant, Coherent Judgement: Impaired Orientation: Person, Place, Time, Appropriate for developmental age Obsessive Compulsive Thoughts/Behaviors: None  Cognitive Functioning Concentration: Normal Memory: Remote Intact, Recent Intact Is patient IDD: No Insight: Poor Impulse Control: Poor Appetite: Fair Have you had any weight changes? : Loss Amount of the weight change? (lbs): 3 lbs Sleep: Decreased Total Hours of Sleep: 5 Vegetative Symptoms: None  ADLScreening [facility] Assessment Services) Patient's cognitive ability adequate to safely complete daily activities?: Yes Patient able to express need for assistance with ADLs?: Yes Independently performs ADLs?: Yes (appropriate for developmental age)  Prior Inpatient Therapy Prior Inpatient Therapy: Yes Prior Therapy Dates: 2020, 2019 Prior Therapy Facilty/Provider(s): [facility] Reason for Treatment: MDD, SA, SI  Prior Outpatient Therapy Prior Outpatient Therapy: No Does patient have an ACCT team?: No Does patient have Intensive In-House Services?  : No Does patient have [facility] services? : No Does patient have P4CC services?: No  ADL Screening (condition at time of admission) Patient's cognitive ability adequate to safely complete daily activities?: Yes Is the patient deaf or have difficulty hearing?: No Does the patient have difficulty seeing, even when wearing glasses/contacts?: No Does the patient have difficulty concentrating, remembering, or making decisions?: No Patient able to express need for assistance with ADLs?: Yes Does the  patient have difficulty dressing or bathing?:  No Independently performs ADLs?: Yes (appropriate for developmental age) Does the patient have difficulty walking or climbing stairs?: No Weakness of Legs: None Weakness of Arms/Hands: None  Home Assistive Devices/Equipment Home Assistive Devices/Equipment: None    Abuse/Neglect Assessment (Assessment to be complete while patient is alone) Abuse/Neglect Assessment Can Be Completed: Yes Physical Abuse: Denies Verbal Abuse: Denies Sexual Abuse: Denies Exploitation of patient/patient's resources: Denies Self-Neglect: Denies     Merchant navy officerAdvance Directives (For Healthcare) Does Patient Have a Medical Advance Directive?: No Would patient like information on creating a medical advance directive?: No - Patient declined          Disposition: Andrew ConnJason Berry, NP recommends continued OBS pending AM psych assessment due to hx of suicide attempts and pt's ongoing SI with plan. Pt's nurse Areta HaberHui, RN and EDP Ward, Layla MawKristen N, DO have been advised.  Disposition Initial Assessment Completed for this Encounter: Yes Disposition of Patient: (continued OBS pending AM psych assessment) Patient refused recommended treatment: No  This service was provided via telemedicine using a 2-way, interactive audio and video technology.  Names of all persons participating in this telemedicine service and their role in this encounter. Name: Andrew Rivas Role: Patient  Name: Princess BruinsAquicha Finlee Rivas Role: TTS          Andrew Rivas 03/29/2019 6:44 AM

## 2019-03-29 NOTE — Progress Notes (Signed)
Received Andrew Rivas asleep in his bed with the sitter at the bedside. Later he endorsed having passive SI thoughts. He was reassess this morning and the suicidal thoughts remains. He is hoping for  assistance with his home  medications.

## 2019-03-29 NOTE — ED Notes (Signed)
Pt. In burgundy scrubs. Pt. And belongings searched and wanded by security.pt. has 2 belongings bags locked up in TCU #[id number]. Pt. Has cell phone, 1 pr white shoes, 1 black/white jacket, 1 blue jeans, wallet(no money), 1 thermal t-shirt, 1 pr white socks and 1 orange sweat jacket.

## 2019-03-29 NOTE — Progress Notes (Signed)
Andrew Romp, NP recommends continued OBS pending AM psych assessment due to hx of suicide attempts and pt's ongoing SI with plan. Pt's nurse Garen Grams, RN and EDP Ward, Delice Bison, DO have been advised.  Lind Covert, MSW, LCSW Therapeutic Triage Specialist  [phone]

## 2019-03-29 NOTE — ED Provider Notes (Addendum)
12:49 AM  Assumed care from Dr. Ashok Cordia.  Patient has h/o substance abuse and substance induced mood disorder.  EMS called by concerned bystanders.  Here patient endorses SI.  Will need to be reassessed when clinically sober.  Alcohol level 378 at 6:25 PM.  3:20 AM  Pt still resting comfortably.  Not clinically sober.  6:00 AM  Pt now clinically sober.  He is arousable to voice.  Still endorses suicidal thoughts with plan to overdose.  Sales promotion account executive for safety.  Will order TTS evaluation.  We will also place him on CIWA protocol although no sign of withdrawal currently.  I reviewed all nursing notes and pertinent previous records as available.  I have interpreted any EKGs, lab and urine results, imaging (as available).    Andrew Rivas, Delice Bison, DO 03/29/19 7290   6:27 AM  D/w Andrew Rivas with TTS.  They recommend reassessment later today by psychiatry given his previous history of SI with plan and previous suicide attempts.  I feel this is a reasonable plan.   Jonia Oakey, Delice Bison, DO 03/29/19 [phone]

## 2019-03-29 NOTE — Discharge Instructions (Signed)
It was our pleasure to provide your ER care today - we hope that you feel better.  Avoid alcohol use - see resource guide provided for community treatment options.   Also follow up with primary care doctor in the coming week.  If mental health issues and/or crisis, go directly to [facility] or [facility]  Return to ER if worse, new symptoms, fevers, trouble breathing, new or severe pain, or other concern.

## 2019-03-29 NOTE — Progress Notes (Signed)
TTS CALLING TCU, NO ANSWER  Lind Covert, MSW, LCSW Therapeutic Triage Specialist  [phone]

## 2019-03-29 NOTE — Progress Notes (Signed)
TTS spoke with [place] RN who states she has to find telepsych cart and asks TTS to call back in 10 mins.  Lind Covert, MSW, LCSW Therapeutic Triage Specialist  [phone]

## 2019-03-30 ENCOUNTER — Other Ambulatory Visit: Payer: Self-pay

## 2019-03-30 ENCOUNTER — Inpatient Hospital Stay (HOSPITAL_COMMUNITY)
Admission: AD | Admit: 2019-03-30 | Discharge: 2019-04-05 | DRG: 897 | Disposition: A | Discharge status: Home / Self Care | Payer: No Typology Code available for payment source | Source: Ambulatory Visit | Attending: Psychiatry | Admitting: Psychiatry

## 2019-03-30 ENCOUNTER — Encounter (HOSPITAL_COMMUNITY): Payer: Self-pay | Admitting: Behavioral Health

## 2019-03-30 DIAGNOSIS — Z20828 Contact with and (suspected) exposure to other viral communicable diseases: Secondary | ICD-10-CM | POA: Diagnosis present

## 2019-03-30 DIAGNOSIS — G4700 Insomnia, unspecified: Secondary | ICD-10-CM | POA: Diagnosis present

## 2019-03-30 DIAGNOSIS — Y908 Blood alcohol level of 240 mg/100 ml or more: Secondary | ICD-10-CM | POA: Diagnosis present

## 2019-03-30 DIAGNOSIS — R440 Auditory hallucinations: Secondary | ICD-10-CM | POA: Diagnosis present

## 2019-03-30 DIAGNOSIS — F1020 Alcohol dependence, uncomplicated: Secondary | ICD-10-CM

## 2019-03-30 DIAGNOSIS — F17210 Nicotine dependence, cigarettes, uncomplicated: Secondary | ICD-10-CM | POA: Diagnosis present

## 2019-03-30 DIAGNOSIS — F1994 Other psychoactive substance use, unspecified with psychoactive substance-induced mood disorder: Principal | ICD-10-CM

## 2019-03-30 DIAGNOSIS — R45851 Suicidal ideations: Secondary | ICD-10-CM | POA: Diagnosis present

## 2019-03-30 DIAGNOSIS — F332 Major depressive disorder, recurrent severe without psychotic features: Secondary | ICD-10-CM | POA: Diagnosis present

## 2019-03-30 LAB — SARS CORONAVIRUS 2 (TAT 6-24 HRS): SARS Coronavirus 2: NEGATIVE

## 2019-03-30 MED ORDER — TRAZODONE HCL 50 MG PO TABS
50.0000 mg | ORAL_TABLET | Freq: Every evening | ORAL | Status: DC | PRN
  Administered 2019-03-31 (×2): 50 mg via ORAL
  Filled 2019-03-30 (×2): qty 1

## 2019-03-30 MED ORDER — NALTREXONE HCL 50 MG PO TABS
50.0000 mg | ORAL_TABLET | Freq: Every day | ORAL | Status: DC
  Administered 2019-03-31 – 2019-04-01 (×2): 50 mg via ORAL
  Filled 2019-03-30 (×4): qty 1

## 2019-03-30 MED ORDER — CHLORDIAZEPOXIDE HCL 25 MG PO CAPS: 25.0000 mg | ORAL_CAPSULE | ORAL | Status: DC

## 2019-03-30 MED ORDER — PANTOPRAZOLE SODIUM 40 MG PO TBEC
40.0000 mg | DELAYED_RELEASE_TABLET | Freq: Every day | ORAL | Status: DC
  Administered 2019-03-31 – 2019-04-05 (×6): 40 mg via ORAL
  Filled 2019-03-30 (×9): qty 1

## 2019-03-30 MED ORDER — MAGNESIUM HYDROXIDE 400 MG/5ML PO SUSP: 30.0000 mL | Freq: Every day | ORAL | Status: DC | PRN

## 2019-03-30 MED ORDER — LOPERAMIDE HCL 2 MG PO CAPS: 2.0000 mg | ORAL_CAPSULE | ORAL | Status: DC | PRN

## 2019-03-30 MED ORDER — CHLORDIAZEPOXIDE HCL 25 MG PO CAPS
25.0000 mg | ORAL_CAPSULE | Freq: Four times a day (QID) | ORAL | Status: DC
  Filled 2019-03-30: qty 1

## 2019-03-30 MED ORDER — THIAMINE HCL 100 MG/ML IJ SOLN: 100.0000 mg | Freq: Once | INTRAMUSCULAR | Status: DC

## 2019-03-30 MED ORDER — CHLORDIAZEPOXIDE HCL 25 MG PO CAPS: 25.0000 mg | ORAL_CAPSULE | Freq: Every day | ORAL | Status: DC

## 2019-03-30 MED ORDER — VITAMIN B-1 100 MG PO TABS
100.0000 mg | ORAL_TABLET | Freq: Every day | ORAL | Status: DC
  Administered 2019-03-31 – 2019-04-05 (×6): 100 mg via ORAL
  Filled 2019-03-30 (×9): qty 1

## 2019-03-30 MED ORDER — ACETAMINOPHEN 325 MG PO TABS
650.0000 mg | ORAL_TABLET | Freq: Four times a day (QID) | ORAL | Status: DC | PRN
  Administered 2019-04-02: 650 mg via ORAL
  Filled 2019-03-30: qty 2

## 2019-03-30 MED ORDER — CHLORDIAZEPOXIDE HCL 25 MG PO CAPS
25.0000 mg | ORAL_CAPSULE | Freq: Four times a day (QID) | ORAL | Status: AC | PRN
  Administered 2019-03-30 – 2019-04-01 (×4): 25 mg via ORAL
  Filled 2019-03-30 (×3): qty 1

## 2019-03-30 MED ORDER — CHLORDIAZEPOXIDE HCL 25 MG PO CAPS: 25.0000 mg | ORAL_CAPSULE | Freq: Three times a day (TID) | ORAL | Status: DC

## 2019-03-30 MED ORDER — ENSURE ENLIVE PO LIQD
237.0000 mL | Freq: Two times a day (BID) | ORAL | Status: DC
  Administered 2019-03-30 – 2019-04-04 (×2): 237 mL via ORAL

## 2019-03-30 MED ORDER — ADULT MULTIVITAMIN W/MINERALS CH
1.0000 | ORAL_TABLET | Freq: Every day | ORAL | Status: DC
  Administered 2019-03-30 – 2019-04-05 (×7): 1 via ORAL
  Filled 2019-03-30 (×11): qty 1

## 2019-03-30 MED ORDER — HYDROXYZINE HCL 25 MG PO TABS
25.0000 mg | ORAL_TABLET | Freq: Four times a day (QID) | ORAL | Status: AC | PRN
  Administered 2019-03-30 – 2019-04-02 (×5): 25 mg via ORAL
  Filled 2019-03-30 (×5): qty 1

## 2019-03-30 MED ORDER — FLUOXETINE HCL 20 MG PO CAPS
20.0000 mg | ORAL_CAPSULE | Freq: Every day | ORAL | Status: DC
  Administered 2019-03-31 – 2019-04-05 (×6): 20 mg via ORAL
  Filled 2019-03-30 (×4): qty 1
  Filled 2019-03-30: qty 7
  Filled 2019-03-30 (×3): qty 1
  Filled 2019-03-30: qty 7
  Filled 2019-03-30: qty 1

## 2019-03-30 MED ORDER — ALUM & MAG HYDROXIDE-SIMETH 200-200-20 MG/5ML PO SUSP: 30.0000 mL | ORAL | Status: DC | PRN

## 2019-03-30 MED ORDER — ONDANSETRON 4 MG PO TBDP: 4.0000 mg | ORAL_TABLET | Freq: Four times a day (QID) | ORAL | Status: DC | PRN

## 2019-03-30 MED ORDER — GABAPENTIN 300 MG PO CAPS
300.0000 mg | ORAL_CAPSULE | Freq: Three times a day (TID) | ORAL | Status: DC
  Administered 2019-03-30 – 2019-04-05 (×19): 300 mg via ORAL
  Filled 2019-03-30: qty 1
  Filled 2019-03-30: qty 21
  Filled 2019-03-30 (×6): qty 1
  Filled 2019-03-30 (×2): qty 21
  Filled 2019-03-30 (×5): qty 1
  Filled 2019-03-30: qty 21
  Filled 2019-03-30 (×7): qty 1
  Filled 2019-03-30: qty 21
  Filled 2019-03-30 (×3): qty 1
  Filled 2019-03-30: qty 21
  Filled 2019-03-30: qty 1

## 2019-03-30 NOTE — Tx Team (Signed)
Initial Treatment Plan 03/30/2019 3:44 PM Andrew Rivas VWP:[id number]    PATIENT STRESSORS: Financial difficulties Occupational concerns   PATIENT STRENGTHS: Ability for insight Capable of independent living   PATIENT IDENTIFIED PROBLEMS: "suicide attempts"  "concentrate on stop drinking"                   DISCHARGE CRITERIA:  Ability to meet basic life and health needs Adequate post-discharge living arrangements Improved stabilization in mood, thinking, and/or behavior  PRELIMINARY DISCHARGE PLAN: Attend aftercare/continuing care group Attend PHP/IOP  PATIENT/FAMILY INVOLVEMENT: This treatment plan has been presented to and reviewed with the patient, Andrew Rivas, and/or family member.  The patient and family have been given the opportunity to ask questions and make suggestions.  Marissa Calamity, RN 03/30/2019, 3:44 PM

## 2019-03-30 NOTE — Progress Notes (Signed)
This patient has been offered a bed at [facility].  Bed: 305-2  Attending Provider: Dr.Clary  RN Call for Report: [phone]  This patient is voluntary, he may arrive at any time.  Stephanie Acre, [place] Social Worker [phone]

## 2019-03-30 NOTE — Progress Notes (Signed)
Pt admitted to the adult unit. The pt reported he's been drinking 3 to 4 (40 oz) beer daily. The pt's initial BAL was 378. The pt reported having withdrawal symptoms: tremors, anxiety, body aches, and sweats. The pt expressed SI with a plan to overdose on pills. The pt denied HI. The pt denied AVH. The pt reported feeling depressed for the past 3 to 4 weeks due to his heavy alcohol consumption. The pt reported legal issues and has an upcoming court date in January for intoxication and disorderly conduct. The pt is seeking tx for alcohol detox.

## 2019-03-30 NOTE — Progress Notes (Signed)
[facility] Notes:  (Nursing/MHT/Case Management/Adjunct)  Date:  03/30/2019  Time:  2030  Type of Therapy:  wrap up group  Participation Level:  Active  Participation Quality:  Appropriate, Attentive, Sharing and Supportive  Affect:  Depressed  Cognitive:  Appropriate  Insight:  Improving  Engagement in Group:  Engaged  Modes of Intervention:  Clarification, Education and Support  Summary of Progress/Problems: Pt shared his favorite person, place, and sound. Pt reported being relieved he made it back into the hospital. It was hard for pt to think of something he likes about himself. Pt shared that he likes to gain new knowledge and would enjoy going back to school.   Shellia Cleverly 03/30/2019, 9:27 PM

## 2019-03-30 NOTE — Progress Notes (Signed)
Patient has been accepted to bed 305-2. Call report to [phone]. Chrys Racer.

## 2019-03-31 DIAGNOSIS — F1994 Other psychoactive substance use, unspecified with psychoactive substance-induced mood disorder: Principal | ICD-10-CM

## 2019-03-31 NOTE — Tx Team (Signed)
Interdisciplinary Treatment and Diagnostic Plan Update  03/31/2019 Time of Session: 9:00am Andrew Rivas MRN: [id number]  Principal Diagnosis: Substance induced mood disorder ([place])  Secondary Diagnoses: Principal Problem:   Substance induced mood disorder ([place]) Active Problems:   Alcohol dependence ([place])   Current Medications:  Current Facility-Administered Medications  Medication Dose Route Frequency Provider Last Rate Last Dose  . acetaminophen (TYLENOL) tablet 650 mg  650 mg Oral Q6H PRN Emmaline Kluver, FNP      . alum & mag hydroxide-simeth (MAALOX/MYLANTA) 200-200-20 MG/5ML suspension 30 mL  30 mL Oral Q4H PRN Emmaline Kluver, FNP      . chlordiazePOXIDE (LIBRIUM) capsule 25 mg  25 mg Oral Q6H PRN Emmaline Kluver, FNP   25 mg at 03/30/19 2122  . feeding supplement (ENSURE ENLIVE) (ENSURE ENLIVE) liquid 237 mL  237 mL Oral BID BM Nwoko, Agnes I, NP   237 mL at 03/30/19 1637  . FLUoxetine (PROZAC) capsule 20 mg  20 mg Oral Daily Emmaline Kluver, FNP   20 mg at 03/31/19 0803  . gabapentin (NEURONTIN) capsule 300 mg  300 mg Oral TID Emmaline Kluver, FNP   300 mg at 03/31/19 0803  . hydrOXYzine (ATARAX/VISTARIL) tablet 25 mg  25 mg Oral Q6H PRN Emmaline Kluver, FNP   25 mg at 03/31/19 0806  . loperamide (IMODIUM) capsule 2-4 mg  2-4 mg Oral PRN Emmaline Kluver, FNP      . magnesium hydroxide (MILK OF MAGNESIA) suspension 30 mL  30 mL Oral Daily PRN Emmaline Kluver, FNP      . multivitamin with minerals tablet 1 tablet  1 tablet Oral Daily Emmaline Kluver, FNP   1 tablet at 03/31/19 0802  . naltrexone (DEPADE) tablet 50 mg  50 mg Oral Daily Emmaline Kluver, FNP   50 mg at 03/31/19 0900  . ondansetron (ZOFRAN-ODT) disintegrating tablet 4 mg  4 mg Oral Q6H PRN Emmaline Kluver, FNP      . pantoprazole (PROTONIX) EC tablet 40 mg  40 mg Oral Daily Emmaline Kluver, FNP   40 mg at 03/31/19 0803  . thiamine (B-1) injection 100 mg  100 mg Intramuscular Once Emmaline Kluver, FNP      . thiamine (VITAMIN B-1) tablet 100 mg  100 mg  Oral Daily Emmaline Kluver, FNP   100 mg at 03/31/19 0803  . traZODone (DESYREL) tablet 50 mg  50 mg Oral QHS PRN,MR X 1 Lindon Romp A, NP       PTA Medications: Medications Prior to Admission  Medication Sig Dispense Refill Last Dose  . FLUoxetine (PROZAC) 20 MG capsule Take 1 capsule (20 mg total) by mouth daily. 90 capsule 1   . gabapentin (NEURONTIN) 300 MG capsule Take 1 capsule (300 mg total) by mouth 3 (three) times daily. 90 capsule 2   . hydrOXYzine (VISTARIL) 50 MG capsule Take 1 capsule (50 mg total) by mouth 3 (three) times daily as needed. 30 capsule 2   . naltrexone (DEPADE) 50 MG tablet Take 1 tablet (50 mg total) by mouth daily. 90 tablet 1   . omeprazole (PRILOSEC) 40 MG capsule Take 1 capsule (40 mg total) by mouth daily. 90 capsule 3   . polyvinyl alcohol (LIQUIFILM TEARS) 1.4 % ophthalmic solution Place 1 drop into both eyes as needed for dry eyes.       Patient Stressors: Financial difficulties Occupational concerns  Patient Strengths: Ability for insight Capable of  independent living  Treatment Modalities: Medication Management, Group therapy, Case management,  1 to 1 session with clinician, Psychoeducation, Recreational therapy.   Physician Treatment Plan for Primary Diagnosis: Substance induced mood disorder ([place]) Long Term Goal(s): Improvement in symptoms so as ready for discharge Improvement in symptoms so as ready for discharge   Short Term Goals: Ability to identify changes in lifestyle to reduce recurrence of condition will improve Ability to verbalize feelings will improve Ability to disclose and discuss suicidal ideas Ability to demonstrate self-control will improve Ability to identify and develop effective coping behaviors will improve Ability to maintain clinical measurements within normal limits will improve Compliance with prescribed medications will improve Ability to identify triggers associated with substance abuse/mental health issues will  improve Ability to identify changes in lifestyle to reduce recurrence of condition will improve Ability to verbalize feelings will improve Ability to disclose and discuss suicidal ideas Ability to demonstrate self-control will improve Ability to identify and develop effective coping behaviors will improve Ability to maintain clinical measurements within normal limits will improve Compliance with prescribed medications will improve Ability to identify triggers associated with substance abuse/mental health issues will improve  Medication Management: Evaluate patient's response, side effects, and tolerance of medication regimen.  Therapeutic Interventions: 1 to 1 sessions, Unit Group sessions and Medication administration.  Evaluation of Outcomes: Not Met  Physician Treatment Plan for Secondary Diagnosis: Principal Problem:   Substance induced mood disorder ([place]) Active Problems:   Alcohol dependence ([place])  Long Term Goal(s): Improvement in symptoms so as ready for discharge Improvement in symptoms so as ready for discharge   Short Term Goals: Ability to identify changes in lifestyle to reduce recurrence of condition will improve Ability to verbalize feelings will improve Ability to disclose and discuss suicidal ideas Ability to demonstrate self-control will improve Ability to identify and develop effective coping behaviors will improve Ability to maintain clinical measurements within normal limits will improve Compliance with prescribed medications will improve Ability to identify triggers associated with substance abuse/mental health issues will improve Ability to identify changes in lifestyle to reduce recurrence of condition will improve Ability to verbalize feelings will improve Ability to disclose and discuss suicidal ideas Ability to demonstrate self-control will improve Ability to identify and develop effective coping behaviors will improve Ability to maintain clinical  measurements within normal limits will improve Compliance with prescribed medications will improve Ability to identify triggers associated with substance abuse/mental health issues will improve     Medication Management: Evaluate patient's response, side effects, and tolerance of medication regimen.  Therapeutic Interventions: 1 to 1 sessions, Unit Group sessions and Medication administration.  Evaluation of Outcomes: Not Met   RN Treatment Plan for Primary Diagnosis: Substance induced mood disorder ([place]) Long Term Goal(s): Knowledge of disease and therapeutic regimen to maintain health will improve  Short Term Goals: Ability to verbalize feelings will improve, Ability to identify and develop effective coping behaviors will improve and Compliance with prescribed medications will improve  Medication Management: RN will administer medications as ordered by provider, will assess and evaluate patient's response and provide education to patient for prescribed medication. RN will report any adverse and/or side effects to prescribing provider.  Therapeutic Interventions: 1 on 1 counseling sessions, Psychoeducation, Medication administration, Evaluate responses to treatment, Monitor vital signs and CBGs as ordered, Perform/monitor CIWA, COWS, AIMS and Fall Risk screenings as ordered, Perform wound care treatments as ordered.  Evaluation of Outcomes: Not Met   LCSW Treatment Plan for Primary Diagnosis: Substance induced mood  disorder [facility]) Long Term Goal(s): Safe transition to appropriate next level of care at discharge, Engage patient in therapeutic group addressing interpersonal concerns.  Short Term Goals: Engage patient in aftercare planning with referrals and resources, Increase social support, Increase emotional regulation, Identify triggers associated with mental health/substance abuse issues and Increase skills for wellness and recovery  Therapeutic Interventions: Assess for all discharge  needs, 1 to 1 time with Social worker, Explore available resources and support systems, Assess for adequacy in community support network, Educate family and significant other(s) on suicide prevention, Complete Psychosocial Assessment, Interpersonal group therapy.  Evaluation of Outcomes: Not Met   Progress in Treatment: Attending groups: Yes. Participating in groups: Yes. Taking medication as prescribed: Yes. Toleration medication: Yes. Family/Significant other contact made: No, will contact:  supports if consents are granted. Patient understands diagnosis: Yes. Discussing patient identified problems/goals with staff: Yes. Medical problems stabilized or resolved: No. Denies suicidal/homicidal ideation: No. Issues/concerns per patient self-inventory: Yes.  New problem(s) identified: Yes, Describe:  financial stressors  New Short Term/Long Term Goal(s): detox, medication management for mood stabilization; elimination of SI thoughts; development of comprehensive mental wellness/sobriety plan.  Patient Goals: "Get back on my meds and level out."  Discharge Plan or Barriers:  Patient wishes to pursue residential substance use treatment. CSW assessing for appropriate referrals.   Reason for Continuation of Hospitalization: Anxiety Depression Medication stabilization Suicidal ideation Withdrawal symptoms   Estimated Length of Stay: 3-5 days Attendees: Patient: Andrew Rivas 03/31/2019 10:42 AM  Physician: Queen Blossom 03/31/2019 10:42 AM  Nursing: Rise Paganini, RN 03/31/2019 10:42 AM  RN Care Manager: 03/31/2019 10:42 AM  Social Worker: Stephanie Acre, [place] 03/31/2019 10:42 AM  Recreational Therapist:  03/31/2019 10:42 AM  Other: Harriett Sine, NP 03/31/2019 10:42 AM  Other:  03/31/2019 10:42 AM  Other: 03/31/2019 10:42 AM    Scribe for Treatment Team: Joellen [place], [place] 03/31/2019 10:42 AM

## 2019-03-31 NOTE — Progress Notes (Signed)
The patient's positive event for the day is that he was re-started on his medication. He admitted to having been medication non-compliant prior to admission but is confident that he will continue to improve with his current medication regiment. He also admits to not having any suicidal thoughts. His goal for tomorrow is to attend groups and write down a list of reasons for his admission.
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
## 2019-03-31 NOTE — Progress Notes (Signed)
NUTRITION ASSESSMENT  Pt identified as at risk on the Malnutrition Screen Tool  INTERVENTION: 1. Supplements: Continue Ensure Enlive po BID, each supplement provides 350 kcal and 20 grams of protein  NUTRITION DIAGNOSIS: Unintentional weight loss related to sub-optimal intake as evidenced by pt report.   Goal: Pt to meet >/= 90% of their estimated nutrition needs.  Monitor:  PO intake  Assessment:  Pt admitted for substance abuse, pt reports drinking 3-4 40 oz beers daily. Pt reports feeling some withdrawal symptoms. Per weight records, weights have remained stable.  Ensure supplements have been ordered, will continue.  Height: Ht Readings from Last 1 Encounters:  03/30/19 6\' 1"  (1.854 m)    Weight: Wt Readings from Last 1 Encounters:  03/30/19 67.6 kg    Weight Hx: Wt Readings from Last 10 Encounters:  03/30/19 67.6 kg  12/02/18 115.2 kg -suspect outlier  02/17/18 65.8 kg  02/13/18 63.5 kg  01/25/18 63.5 kg  10/05/17 63.5 kg  10/04/17 63.5 kg  09/28/17 63.5 kg  09/26/17 63.5 kg  09/23/17 63.5 kg    BMI:  Body mass index is 19.66 kg/m. Pt meets criteria for normal based on current BMI.  Estimated Nutritional Needs: Kcal: 25-30 kcal/kg Protein: > 1 gram protein/kg Fluid: 1 ml/kcal  Diet Order:  Diet Order            Diet regular Room service appropriate? Yes; Fluid consistency: Thin  Diet effective now             Pt is also offered choice of unit snacks mid-morning and mid-afternoon.  Pt is eating as desired.   Lab results and medications reviewed.   Clayton Bibles, MS, RD, LDN Inpatient Clinical Dietitian Pager: [phone] After Hours Pager: [phone]

## 2019-03-31 NOTE — H&P (Signed)
Psychiatric Admission Assessment Adult  Patient Identification: Andrew Rivas MRN:  [id number] Date of Evaluation:  03/31/2019 Chief Complaint:  MDD,SI Principal Diagnosis: Substance induced mood disorder ([place]) Diagnosis:  Principal Problem:   Substance induced mood disorder ([place]) Active Problems:   Alcohol dependence ([place])  History of Present Illness: Patient is seen and examined.  Patient is a 58 year old male with a longstanding history significant for alcohol dependence, substance-induced mood disorder, depression unspecified, anxiety unspecified who originally presented to the [facility] emergency department on 03/28/2019 with suicidal ideation.  He apparently had been seen by bystanders in a shopping [place] lying on the ground unresponsive.  EMS brought the patient to the emergency department.  He stated he was suicidal at that time and plan to overdose on medications.  The patient stated that he had run out of his medicine several weeks ago, had and had not followed up with his discharge plan after he had been discharged in September of this year.  His blood alcohol on admission was 378.  In the emergency department he also endorsed auditory hallucinations.  His last admission to our facility was on 01/22/2019.  At that time the primary diagnosis was alcohol dependence, benzodiazepine dependence, opiate use disorder, substance-induced mood disorder.  His blood alcohol on admission at that time was 401.  He was discharged on fluoxetine, gabapentin, hydroxyzine, naltrexone and omeprazole.  He stated he had forgotten about his appointment at [facility] that had been scheduled at discharge.  He admitted he has intoxication charges pending, but the court date is not until January.  He he stated he is attempting to get into a rehabilitation facility.  Review of the electronic medical record revealed at least 10 emergency room visits and 2 hospitalizations in 12 months.  As stated above,  his blood alcohol was 378 but his drug screen was negative.  He was admitted to the hospital for evaluation and stabilization.  Associated Signs/Symptoms: Depression Symptoms:  depressed mood, anhedonia, insomnia, psychomotor agitation, fatigue, feelings of worthlessness/guilt, difficulty concentrating, hopelessness, suicidal thoughts without plan, anxiety, loss of energy/fatigue, disturbed sleep, (Hypo) Manic Symptoms:  Impulsivity, Irritable Mood, Anxiety Symptoms:  Excessive Worry, Psychotic Symptoms:  Hallucinations: Auditory PTSD Symptoms: Negative Total Time spent with patient: 30 minutes  Past Psychiatric History: This is his second psychiatric hospitalization in our facility.  The first was on 01/22/2019.  He has had at least 10-12 visits in the emergency department at various hospitals since 01/26/2019.  Most of these relate to alcohol intoxication, suicidal ideation as well as narcotic overdose.  Is the patient at risk to self? Yes.    Has the patient been a risk to self in the past 6 months? Yes.    Has the patient been a risk to self within the distant past? No.  Is the patient a risk to others? No.  Has the patient been a risk to others in the past 6 months? No.  Has the patient been a risk to others within the distant past? No.   Prior Inpatient Therapy:   Prior Outpatient Therapy:    Alcohol Screening: 1. How often do you have a drink containing alcohol?: 2 to 4 times a month 2. How many drinks containing alcohol do you have on a typical day when you are drinking?: 3 or 4 3. How often do you have six or more drinks on one occasion?: Monthly AUDIT-C Score: 5 4. How often during the last year have you found that you were not  able to stop drinking once you had started?: Monthly 5. How often during the last year have you failed to do what was normally expected from you becasue of drinking?: Weekly 6. How often during the last year have you needed a first drink in the  morning to get yourself going after a heavy drinking session?: Monthly 7. How often during the last year have you had a feeling of guilt of remorse after drinking?: Monthly 8. How often during the last year have you been unable to remember what happened the night before because you had been drinking?: Monthly 9. Have you or someone else been injured as a result of your drinking?: Yes, during the last year 10. Has a relative or friend or a doctor or another health worker been concerned about your drinking or suggested you cut down?: No Alcohol Use Disorder Identification Test Final Score (AUDIT): 20 Alcohol Brief Interventions/Follow-up: Alcohol Education, Brief Advice Substance Abuse History in the last 12 months:  Yes.   Consequences of Substance Abuse: Withdrawal Symptoms:   Cramps Diaphoresis Diarrhea Headaches Nausea Tremors Vomiting Previous Psychotropic Medications: Yes  Psychological Evaluations: Yes  Past Medical History:  Past Medical History:  Diagnosis Date  . Alcohol abuse   . Medical history non-contributory     Past Surgical History:  Procedure Laterality Date  . HAND SURGERY Left   . NO PAST SURGERIES     Family History: History reviewed. No pertinent family history. Family Psychiatric  History: Mother had history of depression, anxiety.  Father had history of alcohol use disorder.  No suicides in the family. Tobacco Screening: Have you used any form of tobacco in the last 30 days? (Cigarettes, Smokeless Tobacco, Cigars, and/or Pipes): Yes Tobacco use, Select all that apply: 5 or more cigarettes per day Are you interested in Tobacco Cessation Medications?: Yes, will notify MD for an order Counseled patient on smoking cessation including recognizing danger situations, developing coping skills and basic information about quitting provided: Yes Social History:  Social History   Substance and Sexual Activity  Alcohol Use Yes   Comment: daily      Social History    Substance and Sexual Activity  Drug Use Yes  . Types: Marijuana, Oxycodone    Additional Social History:                           Allergies:  No Known Allergies Lab Results:  Results for orders placed or performed during the hospital encounter of 03/28/19 (from the past 48 hour(s))  SARS CORONAVIRUS 2 (TAT 6-24 HRS) Nasopharyngeal Nasopharyngeal Swab     Status: None   Collection Time: 03/29/19 11:49 AM   Specimen: Nasopharyngeal Swab  Result Value Ref Range   SARS Coronavirus 2 NEGATIVE NEGATIVE    Comment: (NOTE) SARS-CoV-2 target nucleic acids are NOT DETECTED. The SARS-CoV-2 RNA is generally detectable in upper and lower respiratory specimens during the acute phase of infection. Negative results do not preclude SARS-CoV-2 infection, do not rule out co-infections with other pathogens, and should not be used as the sole basis for treatment or other patient management decisions. Negative results must be combined with clinical observations, patient history, and epidemiological information. The expected result is Negative. Fact Sheet for Patients: HairSlick.no Fact Sheet for Healthcare Providers: quierodirigir.com This test is not yet approved or cleared by the [place] FDA and  has been authorized for detection and/or diagnosis of SARS-CoV-2 by FDA under an Emergency Use Authorization (  EUA). This EUA will remain  in effect (meaning this test can be used) for the duration of the COVID-19 declaration under Section 56 4(b)(1) of the Act, 21 U.S.C. section 360bbb-3(b)(1), unless the authorization is terminated or revoked sooner. Performed at [facility], [address] [place], [place] [address]     Blood Alcohol level:  Lab Results  Component Value Date   ETH 378 [facility](HH) 03/28/2019   ETH 287 (H) 01/29/2019    Metabolic Disorder Labs:  Lab Results  Component Value Date   HGBA1C 5.6  01/23/2019   MPG 114.02 01/23/2019   No results found for: PROLACTIN Lab Results  Component Value Date   CHOL 205 (H) 01/23/2019   TRIG 40 01/23/2019   HDL 119 01/23/2019   CHOLHDL 1.7 01/23/2019   VLDL 8 01/23/2019   LDLCALC 78 01/23/2019    Current Medications: Current Facility-Administered Medications  Medication Dose Route Frequency Provider Last Rate Last Dose  . acetaminophen (TYLENOL) tablet 650 mg  650 mg Oral Q6H PRN Patrcia Dollyate, Tina L, FNP      . alum & mag hydroxide-simeth (MAALOX/MYLANTA) 200-200-20 MG/5ML suspension 30 mL  30 mL Oral Q4H PRN Patrcia Dollyate, Tina L, FNP      . chlordiazePOXIDE (LIBRIUM) capsule 25 mg  25 mg Oral Q6H PRN Patrcia Dollyate, Tina L, FNP   25 mg at 03/30/19 2122  . feeding supplement (ENSURE ENLIVE) (ENSURE ENLIVE) liquid 237 mL  237 mL Oral BID BM Nwoko, Agnes I, NP   237 mL at 03/30/19 1637  . FLUoxetine (PROZAC) capsule 20 mg  20 mg Oral Daily Patrcia Dollyate, Tina L, FNP   20 mg at 03/31/19 0803  . gabapentin (NEURONTIN) capsule 300 mg  300 mg Oral TID Patrcia Dollyate, Tina L, FNP   300 mg at 03/31/19 0803  . hydrOXYzine (ATARAX/VISTARIL) tablet 25 mg  25 mg Oral Q6H PRN Patrcia Dollyate, Tina L, FNP   25 mg at 03/31/19 0806  . loperamide (IMODIUM) capsule 2-4 mg  2-4 mg Oral PRN Patrcia Dollyate, Tina L, FNP      . magnesium hydroxide (MILK OF MAGNESIA) suspension 30 mL  30 mL Oral Daily PRN Patrcia Dollyate, Tina L, FNP      . multivitamin with minerals tablet 1 tablet  1 tablet Oral Daily Patrcia Dollyate, Tina L, FNP   1 tablet at 03/31/19 0802  . naltrexone (DEPADE) tablet 50 mg  50 mg Oral Daily Patrcia Dollyate, Tina L, FNP   50 mg at 03/31/19 0900  . ondansetron (ZOFRAN-ODT) disintegrating tablet 4 mg  4 mg Oral Q6H PRN Patrcia Dollyate, Tina L, FNP      . pantoprazole (PROTONIX) EC tablet 40 mg  40 mg Oral Daily Patrcia Dollyate, Tina L, FNP   40 mg at 03/31/19 0803  . thiamine (B-1) injection 100 mg  100 mg Intramuscular Once Patrcia Dollyate, Tina L, FNP      . thiamine (VITAMIN B-1) tablet 100 mg  100 mg Oral Daily Patrcia Dollyate, Tina L, FNP   100 mg at 03/31/19 0803  . traZODone  (DESYREL) tablet 50 mg  50 mg Oral QHS PRN,MR X 1 Nira ConnBerry, Jason A, NP       PTA Medications: Medications Prior to Admission  Medication Sig Dispense Refill Last Dose  . FLUoxetine (PROZAC) 20 MG capsule Take 1 capsule (20 mg total) by mouth daily. 90 capsule 1   . gabapentin (NEURONTIN) 300 MG capsule Take 1 capsule (300 mg total) by mouth 3 (three) times daily. 90 capsule 2   . hydrOXYzine (  VISTARIL) 50 MG capsule Take 1 capsule (50 mg total) by mouth 3 (three) times daily as needed. 30 capsule 2   . naltrexone (DEPADE) 50 MG tablet Take 1 tablet (50 mg total) by mouth daily. 90 tablet 1   . omeprazole (PRILOSEC) 40 MG capsule Take 1 capsule (40 mg total) by mouth daily. 90 capsule 3   . polyvinyl alcohol (LIQUIFILM TEARS) 1.4 % ophthalmic solution Place 1 drop into both eyes as needed for dry eyes.       Musculoskeletal: Strength & Muscle Tone: within normal limits Gait & Station: normal Patient leans: N/A  Psychiatric Specialty Exam: Physical Exam  Nursing note and vitals reviewed. Constitutional: He is oriented to person, place, and time. He appears well-developed and well-nourished.  HENT:  Head: Normocephalic and atraumatic.  Respiratory: Effort normal.  Neurological: He is alert and oriented to person, place, and time.    ROS  Blood pressure 118/90, pulse (!) 112, temperature 98.1 F (36.7 C), temperature source Oral, resp. rate 14, height 6\' 1"  (1.854 m), weight 67.6 kg, SpO2 97 %.Body mass index is 19.66 kg/m.  General Appearance: Disheveled  Eye Contact:  Fair  Speech:  Normal Rate  Volume:  Normal  Mood:  Anxious, Depressed and Dysphoric  Affect:  Congruent  Thought Process:  Coherent and Descriptions of Associations: Circumstantial  Orientation:  Full (Time, Place, and Person)  Thought Content:  Logical  Suicidal Thoughts:  Yes.  without intent/plan  Homicidal Thoughts:  No  Memory:  Immediate;   Fair Recent;   Fair Remote;   Fair  Judgement:  Impaired   Insight:  Lacking  Psychomotor Activity:  Increased  Concentration:  Concentration: Fair and Attention Span: Fair  Recall:  of Knowledge:  Fair  Language:  Fair  Akathisia:  Negative  Handed:  Right  AIMS (if indicated):     Assets:  Desire for Improvement Resilience  ADL's:  Intact  Cognition:  WNL  Sleep:  Number of Hours: 3    Treatment Plan Summary: Daily contact with patient to assess and evaluate symptoms and progress in treatment, Medication management and Plan : Patient is seen and examined.  Patient is a 58 year old male with the above-stated past psychiatric history who was admitted secondary to suicidal ideation.  He stated he had remained sober for approximately 3 weeks after discharge from the facility in September.  He stated he had not followed up with his outpatient appointments.  He stated he had run out of his medications.  He will be admitted to the hospital.  He will be integrated into the milieu.  He will be encouraged to attend groups.  He will meet with social work and discuss possibility of rehabilitation placement.  He has already been started on his fluoxetine and gabapentin.  He is already been started back on his naltrexone and Protonix.  He will have available Librium 25 mg p.o. every 6 hours as needed a CIWA greater than 10.  Given the fact he has been in the emergency room for 2 to 3 days I think his symptoms should be at least minimal.  This a.m. his blood pressure is 118/90, pulse is 112.  He is afebrile.  His CIWA this a.m. was 4.  He only slept 3 hours last night.  He does have trazodone 50 mg p.o. nightly as needed insomnia available.  Review of his laboratories revealed essentially normal electrolytes, normal CBC including MCV and MCH.  Blood alcohol and drug  screen as mentioned above.  Observation Level/Precautions:  Detox 15 minute checks  Laboratory:  Chemistry Profile  Psychotherapy:    Medications:    Consultations:    Discharge  Concerns:    Estimated LOS:  Other:     Physician Treatment Plan for Primary Diagnosis: Substance induced mood disorder (HCC) Long Term Goal(s): Improvement in symptoms so as ready for discharge  Short Term Goals: Ability to identify changes in lifestyle to reduce recurrence of condition will improve, Ability to verbalize feelings will improve, Ability to disclose and discuss suicidal ideas, Ability to demonstrate self-control will improve, Ability to identify and develop effective coping behaviors will improve, Ability to maintain clinical measurements within normal limits will improve, Compliance with prescribed medications will improve and Ability to identify triggers associated with substance abuse/mental health issues will improve  Physician Treatment Plan for Secondary Diagnosis: Principal Problem:   Substance induced mood disorder (HCC) Active Problems:   Alcohol dependence ([facility])  Long Term Goal(s): Improvement in symptoms so as ready for discharge  Short Term Goals: Ability to identify changes in lifestyle to reduce recurrence of condition will improve, Ability to verbalize feelings will improve, Ability to disclose and discuss suicidal ideas, Ability to demonstrate self-control will improve, Ability to identify and develop effective coping behaviors will improve, Ability to maintain clinical measurements within normal limits will improve, Compliance with prescribed medications will improve and Ability to identify triggers associated with substance abuse/mental health issues will improve  I certify that inpatient services furnished can reasonably be expected to improve the patient's condition.    Antonieta Pert, MD 11/9/202010:31 AM

## 2019-03-31 NOTE — [facility] Group Notes (Signed)
LCSW Group Therapy Note 03/31/2019 2:59 PM  Type of Therapy and Topic: Group Therapy: Overcoming Obstacles  Participation Level: Active  Description of Group:  In this group patients will be encouraged to explore what they see as obstacles to their own wellness and recovery. They will be guided to discuss their thoughts, feelings, and behaviors related to these obstacles. The group will process together ways to cope with barriers, with attention given to specific choices patients can make. Each patient will be challenged to identify changes they are motivated to make in order to overcome their obstacles. This group will be process-oriented, with patients participating in exploration of their own experiences as well as giving and receiving support and challenge from other group members.  Therapeutic Goals: 1. Patient will identify personal and current obstacles as they relate to admission. 2. Patient will identify barriers that currently interfere with their wellness or overcoming obstacles.  3. Patient will identify feelings, thought process and behaviors related to these barriers. 4. Patient will identify two changes they are willing to make to overcome these obstacles:   Summary of Patient Progress  Andrew Rivas was engaged and participated throughout the group session. Andrew Rivas reports that his main obstacle is his alcohol addiction. Andrew Rivas shared that he uses his alcohol use for coping. Andrew Rivas reports that his lack of discipline also plays a role in his relapses and self sabotaging behaviors.    Therapeutic Modalities:  Cognitive Behavioral Therapy Solution Focused Therapy Motivational Interviewing Relapse Prevention Therapy   Theresa Duty Clinical Social Worker

## 2019-03-31 NOTE — Progress Notes (Signed)
D:  Patient's self inventory sheet, patient has fair sleep, no sleep medication.  Good appetite, normal energy level, good concentration.  Rated depression, hopeless and anxiety 9.  Denied withdrawals.  Denied SI.  Denied physical problems.  Goal is to take meds and get back in line.  Plans to get anxiety, depression to normal level.  Does have discharge plans. A:  Medications administered per MD orders.  Emotional support and encouragement given patient. R:  Denied SI and HI, contracts for safety.  Denied A/V hallucinations.  Safety maintained with 15 minute checks.

## 2019-03-31 NOTE — [facility] Suicide Risk Assessment (Signed)
[place] INPATIENT:  Family/Significant Other Suicide Prevention Education  Suicide Prevention Education:  Patient Refusal for Family/Significant Other Suicide Prevention Education: The patient Andrew Rivas has refused to provide written consent for family/significant other to be provided Family/Significant Other Suicide Prevention Education during admission and/or prior to discharge.  Physician notified.  SPE completed with patient, as patient refused to consent to family contact. SPI pamphlet provided to pt and pt was encouraged to share information with support network, ask questions, and talk about any concerns relating to SPE. Patient denies access to guns/firearms and verbalized understanding of information provided. Mobile Crisis information also provided to patient.    ROCIO WOLAK 03/31/2019, 3:27 PM

## 2019-03-31 NOTE — Progress Notes (Signed)
Recreation Therapy Notes  Date:  11.9.20 Time: 0930 Location: [address] Group Room  Group Topic: Stress Management  Goal Area(s) Addresses:  Patient will identify positive stress management techniques. Patient will identify benefits of using stress management post d/c.  Intervention:  Stress Management  Activity :  Meditation.  LRT introduced the stress management technique of meditation.  LRT played a meditation that focused on taking on the characteristics of a mountain.  Patients were to listen and follow as meditation was played to engage in activity.  Education:  Stress Management, Discharge Planning.   Education Outcome: Acknowledges Education  Clinical Observations/Feedback: Pt did not attend group.    Victorino Sparrow, LRT/CTRS         Victorino Sparrow A 03/31/2019 11:20 AM

## 2019-03-31 NOTE — [facility] Suicide Risk Assessment (Signed)
[facility] Admission Suicide Risk Assessment   Nursing information obtained from:  Patient Demographic factors:  Male, Caucasian, Low socioeconomic status, Unemployed Current Mental Status:  Suicidal ideation indicated by patient, Self-harm behaviors Loss Factors:  Legal issues, Financial problems / change in socioeconomic status Historical Factors:  NA Risk Reduction Factors:  NA  Total Time spent with patient: 30 minutes Principal Problem: Substance induced mood disorder ([place]) Diagnosis:  Principal Problem:   Substance induced mood disorder ([place]) Active Problems:   Alcohol dependence ([place])  Subjective Data: Patient is seen and examined.  Patient is a 58 year old male with a longstanding history significant for alcohol dependence, substance-induced mood disorder, depression unspecified, anxiety unspecified who originally presented to the [facility] emergency department on 03/28/2019 with suicidal ideation.  He apparently had been seen by bystanders in a shopping center lying on the ground unresponsive.  EMS brought the patient to the emergency department.  He stated he was suicidal at that time and plan to overdose on medications.  The patient stated that he had run out of his medicine several weeks ago, had and had not followed up with his discharge plan after he had been discharged in September of this year.  His blood alcohol on admission was 378.  In the emergency department he also endorsed auditory hallucinations.  His last admission to our facility was on 01/22/2019.  At that time the primary diagnosis was alcohol dependence, benzodiazepine dependence, opiate use disorder, substance-induced mood disorder.  His blood alcohol on admission at that time was 401.  He was discharged on fluoxetine, gabapentin, hydroxyzine, naltrexone and omeprazole.  He stated he had forgotten about his appointment at [facility] that had been scheduled at discharge.  He admitted he has intoxication charges  pending, but the court date is not until January.  He he stated he is attempting to get into a rehabilitation facility.  Review of the electronic medical record revealed at least 10 emergency room visits and 2 hospitalizations in 12 months.  As stated above, his blood alcohol was 378 but his drug screen was negative.  He was admitted to the hospital for evaluation and stabilization.  Continued Clinical Symptoms:  Alcohol Use Disorder Identification Test Final Score (AUDIT): 20 The "Alcohol Use Disorders Identification Test", Guidelines for Use in Primary Care, Second Edition.  World Pharmacologist [facility]). Score between 0-7:  no or low risk or alcohol related problems. Score between 8-15:  moderate risk of alcohol related problems. Score between 16-19:  high risk of alcohol related problems. Score 20 or above:  warrants further diagnostic evaluation for alcohol dependence and treatment.   CLINICAL FACTORS:   Depression:   Anhedonia Comorbid alcohol abuse/dependence Hopelessness Impulsivity Insomnia Alcohol/Substance Abuse/Dependencies   Musculoskeletal: Strength & Muscle Tone: within normal limits Gait & Station: normal Patient leans: N/A  Psychiatric Specialty Exam: Physical Exam  Nursing note and vitals reviewed. Constitutional: He is oriented to person, place, and time. He appears well-developed and well-nourished.  HENT:  Head: Normocephalic and atraumatic.  Respiratory: Effort normal.  Neurological: He is alert and oriented to person, place, and time.    ROS  Blood pressure 118/90, pulse (!) 112, temperature 98.1 F (36.7 C), temperature source Oral, resp. rate 14, height 6\' 1"  (1.854 m), weight 67.6 kg, SpO2 97 %.Body mass index is 19.66 kg/m.  General Appearance: Casual  Eye Contact:  Fair  Speech:  Normal Rate  Volume:  Normal  Mood:  Anxious  Affect:  Congruent  Thought Process:  Coherent and Descriptions of Associations: Circumstantial  Orientation:  Full  (Time, Place, and Person)  Thought Content:  Logical  Suicidal Thoughts:  Yes.  without intent/plan  Homicidal Thoughts:  No  Memory:  Immediate;   Fair Recent;   Fair Remote;   Fair  Judgement:  Impaired  Insight:  Lacking  Psychomotor Activity:  Increased  Concentration:  Concentration: Fair and Attention Span: Fair  Recall:  [employer] of Knowledge:  Fair  Language:  Good  Akathisia:  Negative  Handed:  Right  AIMS (if indicated):     Assets:  Desire for Improvement Resilience  ADL's:  Intact  Cognition:  WNL  Sleep:  Number of Hours: 3      COGNITIVE FEATURES THAT CONTRIBUTE TO RISK:  None    SUICIDE RISK:   Minimal: No identifiable suicidal ideation.  Patients presenting with no risk factors but with morbid ruminations; may be classified as minimal risk based on the severity of the depressive symptoms  PLAN OF CARE: Patient is seen and examined.  Patient is a 58 year old male with the above-stated past psychiatric history who was admitted secondary to suicidal ideation.  He stated he had remained sober for approximately 3 weeks after discharge from the facility in September.  He stated he had not followed up with his outpatient appointments.  He stated he had run out of his medications.  He will be admitted to the hospital.  He will be integrated into the milieu.  He will be encouraged to attend groups.  He will meet with social work and discuss possibility of rehabilitation placement.  He has already been started on his fluoxetine and gabapentin.  He is already been started back on his naltrexone and Protonix.  He will have available Librium 25 mg p.o. every 6 hours as needed a CIWA greater than 10.  Given the fact he has been in the emergency room for 2 to 3 days I think his symptoms should be at least minimal.  This a.m. his blood pressure is 118/90, pulse is 112.  He is afebrile.  His CIWA this a.m. was 4.  He only slept 3 hours last night.  He does have trazodone 50 mg p.o.  nightly as needed insomnia available.  Review of his laboratories revealed essentially normal electrolytes, normal CBC including MCV and MCH.  Blood alcohol and drug screen as mentioned above.  I certify that inpatient services furnished can reasonably be expected to improve the patient's condition.   Antonieta Pert, MD 03/31/2019, 8:02 AM

## 2019-04-01 MED ORDER — TRAZODONE HCL 50 MG PO TABS
25.0000 mg | ORAL_TABLET | Freq: Every evening | ORAL | Status: DC | PRN
  Administered 2019-04-01 – 2019-04-02 (×2): 25 mg via ORAL
  Filled 2019-04-01 (×2): qty 1

## 2019-04-01 NOTE — Progress Notes (Signed)
Patient rates his day as a 5 out of a possible 10. He is concerned about his low blood pressure and has been drinking fluids. He states that he is happy to be alive since he elected to not kill himself. He also states that he was not taking his medication at home and turned to drinking alcohol. His goal for tomorrow is to continue to take his medication and to work on getting his "head right".

## 2019-04-01 NOTE — Progress Notes (Signed)
   04/01/19 2315  Psych Admission Type (Psych Patients Only)  Admission Status Voluntary  Psychosocial Assessment  Patient Complaints Depression  Eye Contact Fair  Facial Expression Flat  Affect Appropriate to circumstance  Speech Logical/coherent  Interaction Minimal  Motor Activity Other (Comment) (WNL)  Appearance/Hygiene Unremarkable  Behavior Characteristics Cooperative  Mood Depressed;Pleasant  Thought Process  Coherency WDL  Content WDL  Delusions None reported or observed  Perception WDL  Hallucination None reported or observed  Judgment WDL  Confusion None  Danger to Self  Current suicidal ideation? Denies  Danger to Others  Danger to Others None reported or observed  D: Patient in dayroom reports he had a good day engaging in group activities. A: Medications administered as prescribed. Support and encouragement provided as needed.  R: Patient remains safe on the unit. Will continue to monitor for safety and stability.

## 2019-04-01 NOTE — Plan of Care (Signed)
  Problem: Coping: Goal: Ability to demonstrate self-control will improve Outcome: Progressing   Problem: Safety: Goal: Periods of time without injury will increase Outcome: Progressing   

## 2019-04-01 NOTE — Progress Notes (Signed)
Recreation Therapy Notes  Animal-Assisted Activity (AAA) Program Checklist/Progress Notes Patient Eligibility Criteria Checklist & Daily Group note for Rec Tx Intervention  Date: 11.10.20 Time: 1430 Location: 300 Hall Dayroom   AAA/T Program Assumption of Risk Form signed by Patient/ or Parent Legal Guardian  YES   Patient is free of allergies or sever asthma  YES   Patient reports no fear of animals  YES   Patient reports no history of cruelty to animals  YES   Patient understands his/her participation is voluntary YES   Patient washes hands before animal contact YES   Patient washes hands after animal contact  YES   Behavioral Response: Engaged  Education: Hand Washing, Appropriate Animal Interaction   Education Outcome: Acknowledges understanding/In group clarification offered/Needs additional education.   Clinical Observations/Feedback: Pt attended and participated in group activity.    Andrew Rivas, LRT/CTRS         Andrew Rivas 04/01/2019 3:33 PM 

## 2019-04-01 NOTE — Progress Notes (Signed)
Patient ID: Andrew Rivas, male   DOB: 07/11/1960, 58 y.o.   MRN: [id number]   [place] NOVEL CORONAVIRUS (COVID-19) DAILY CHECK-OFF SYMPTOMS - answer yes or no to each - every day NO YES  Have you had a fever in the past 24 hours?  . Fever (Temp > 37.80C / 100F) X   Have you had any of these symptoms in the past 24 hours? . New Cough .  Sore Throat  .  Shortness of Breath .  Difficulty Breathing .  Unexplained Body Aches   X   Have you had any one of these symptoms in the past 24 hours not related to allergies?   . Runny Nose .  Nasal Congestion .  Sneezing   X   If you have had runny nose, nasal congestion, sneezing in the past 24 hours, has it worsened?  X   EXPOSURES - check yes or no X   Have you traveled outside the state in the past 14 days?  X   Have you been in contact with someone with a confirmed diagnosis of COVID-19 or PUI in the past 14 days without wearing appropriate PPE?  X   Have you been living in the same home as a person with confirmed diagnosis of COVID-19 or a PUI (household contact)?    X   Have you been diagnosed with COVID-19?    X              What to do next: Answered NO to all: Answered YES to anything:   Proceed with unit schedule Follow the [facility] Inpatient Flowsheet.   

## 2019-04-01 NOTE — Progress Notes (Signed)
[facility] MD Progress Note  04/01/2019 3:50 PM Andrew Rivas  MRN:  [id number] Subjective: Patient reports lingering depression.  Denies SI at this time, contracts for safety on unit. Objective : I have reviewed case with treatment team and met with patient. 58 year old male, history of alcohol use disorder, depression, anxiety.  Presented on 11/6 after being found unresponsive at a shopping center.  Admission BAL was 378.  In ED reported depression ,suicidal ideations, auditory hallucinations.  He is known to our unit from prior psychiatric admission in September 2020 at which time was diagnosed with polysubstance dependence, substance-induced mood disorder.  Admission BAL at that time was 401.  Today patient reports some improvement compared to admission although describes lingering anxiety and some depression.  Denies suicidal plans or intentions at this time and contracts for safety on unit. Describes some lingering symptoms of withdrawal, mainly feeling "a little shaky".  At this time appears calm, in no acute distress, no restlessness noted, no significant tremors or diaphoresis noted.  Most recent vitals-BP 91/71, pulse 91.  No disruptive or agitated behaviors on unit, pleasant on approach. Currently does not endorse medication side effects. Of note patient presented hypotensive earlier today.  Does endorse some dizziness at that time which has now resolved.  Currently denies dizziness or lightheadedness.  Principal Problem: Substance induced mood disorder ([facility]) Diagnosis: Principal Problem:   Substance induced mood disorder ([facility]) Active Problems:   Alcohol dependence ([place])  Total Time spent with patient: 15 minutes  Past Psychiatric History:   Past Medical History:  Past Medical History:  Diagnosis Date  . Alcohol abuse   . Medical history non-contributory     Past Surgical History:  Procedure Laterality Date  . HAND SURGERY Left   . NO PAST SURGERIES     Family History:  History reviewed. No pertinent family history. Family Psychiatric  History:  Social History:  Social History   Substance and Sexual Activity  Alcohol Use Yes   Comment: daily      Social History   Substance and Sexual Activity  Drug Use Yes  . Types: Marijuana, Oxycodone    Social History   Socioeconomic History  . Marital status: Single    Spouse name: Not on file  . Number of children: Not on file  . Years of education: Not on file  . Highest education level: Not on file  Occupational History  . Not on file  Social Needs  . Financial resource strain: Not on file  . Food insecurity    Worry: Not on file    Inability: Not on file  . Transportation needs    Medical: Not on file    Non-medical: Not on file  Tobacco Use  . Smoking status: Current Every Day Smoker    Packs/day: 1.00  . Smokeless tobacco: Never Used  Substance and Sexual Activity  . Alcohol use: Yes    Comment: daily   . Drug use: Yes    Types: Marijuana, Oxycodone  . Sexual activity: Not Currently    Birth control/protection: None  Lifestyle  . Physical activity    Days per week: Not on file    Minutes per session: Not on file  . Stress: Not on file  Relationships  . Social Herbalist on phone: Not on file    Gets together: Not on file    Attends religious service: Not on file    Active member of club or organization: Not on file  Attends meetings of clubs or organizations: Not on file    Relationship status: Not on file  Other Topics Concern  . Not on file  Social History Narrative   ** Merged History Encounter **       Additional Social History:   Sleep: Fair  Appetite:  Fair  Current Medications: Current Facility-Administered Medications  Medication Dose Route Frequency Provider Last Rate Last Dose  . acetaminophen (TYLENOL) tablet 650 mg  650 mg Oral Q6H PRN Emmaline Kluver, FNP      . alum & mag hydroxide-simeth (MAALOX/MYLANTA) 200-200-20 MG/5ML suspension 30 mL  30  mL Oral Q4H PRN Emmaline Kluver, FNP      . chlordiazePOXIDE (LIBRIUM) capsule 25 mg  25 mg Oral Q6H PRN Emmaline Kluver, FNP   25 mg at 03/31/19 2116  . feeding supplement (ENSURE ENLIVE) (ENSURE ENLIVE) liquid 237 mL  237 mL Oral BID BM Nwoko, Agnes I, NP   237 mL at 03/30/19 1637  . FLUoxetine (PROZAC) capsule 20 mg  20 mg Oral Daily Emmaline Kluver, FNP   20 mg at 04/01/19 2330  . gabapentin (NEURONTIN) capsule 300 mg  300 mg Oral TID Emmaline Kluver, FNP   300 mg at 04/01/19 1237  . hydrOXYzine (ATARAX/VISTARIL) tablet 25 mg  25 mg Oral Q6H PRN Emmaline Kluver, FNP   25 mg at 03/31/19 0806  . loperamide (IMODIUM) capsule 2-4 mg  2-4 mg Oral PRN Emmaline Kluver, FNP      . magnesium hydroxide (MILK OF MAGNESIA) suspension 30 mL  30 mL Oral Daily PRN Emmaline Kluver, FNP      . multivitamin with minerals tablet 1 tablet  1 tablet Oral Daily Emmaline Kluver, FNP   1 tablet at 04/01/19 0762  . naltrexone (DEPADE) tablet 50 mg  50 mg Oral Daily Emmaline Kluver, FNP   50 mg at 04/01/19 2633  . ondansetron (ZOFRAN-ODT) disintegrating tablet 4 mg  4 mg Oral Q6H PRN Emmaline Kluver, FNP      . pantoprazole (PROTONIX) EC tablet 40 mg  40 mg Oral Daily Emmaline Kluver, FNP   40 mg at 04/01/19 0827  . thiamine (B-1) injection 100 mg  100 mg Intramuscular Once Emmaline Kluver, FNP      . thiamine (VITAMIN B-1) tablet 100 mg  100 mg Oral Daily Emmaline Kluver, FNP   100 mg at 04/01/19 3545  . traZODone (DESYREL) tablet 50 mg  50 mg Oral QHS PRN,MR X 1 Lindon Romp A, NP   50 mg at 03/31/19 2221    Lab Results: No results found for this or any previous visit (from the past 78 hour(s)).  Blood Alcohol level:  Lab Results  Component Value Date   ETH 378 (HH) 03/28/2019   ETH 287 (H) 62/56/3893    Metabolic Disorder Labs: Lab Results  Component Value Date   HGBA1C 5.6 01/23/2019   MPG 114.02 01/23/2019   No results found for: PROLACTIN Lab Results  Component Value Date   CHOL 205 (H) 01/23/2019   TRIG 40 01/23/2019   HDL 119  01/23/2019   CHOLHDL 1.7 01/23/2019   VLDL 8 01/23/2019   LDLCALC 78 01/23/2019    Physical Findings: AIMS: Facial and Oral Movements Muscles of Facial Expression: None, normal Lips and Perioral Area: None, normal Jaw: None, normal Tongue: None, normal,Extremity Movements Upper (arms, wrists, hands, fingers): None, normal Lower (legs, knees, ankles, toes): None, normal, Trunk  Movements Neck, shoulders, hips: None, normal, Overall Severity Severity of abnormal movements (highest score from questions above): None, normal Incapacitation due to abnormal movements: None, normal Patient's awareness of abnormal movements (rate only patient's report): No Awareness, Dental Status Current problems with teeth and/or dentures?: No Does patient usually wear dentures?: No  CIWA:  CIWA-Ar Total: 3 COWS:  COWS Total Score: 1  Musculoskeletal: Strength & Muscle Tone: within normal limits no significant psychomotor agitation or restlessness, no tremors or diaphoresis noted at this time. Gait & Station: normal Patient leans: N/A  Psychiatric Specialty Exam: Physical Exam  ROS felt lightheaded earlier today.  Denies at this time.  No chest pain, no shortness of breath, no cough, no nausea, no vomiting, no fever, no chills  Blood pressure 91/71, pulse 91, temperature 97.6 F (36.4 C), temperature source Oral, resp. rate 14, height '6\' 1"'$  (1.854 m), weight 67.6 kg, SpO2 (!) 81 %.Body mass index is 19.66 kg/m.  General Appearance: Fairly Groomed  Eye Contact:  Good  Speech:  Normal Rate  Volume:  Normal  Mood:  Reports some improvement compared to admission, still vaguely depressed/anxious  Affect:  Congruent  Thought Process:  Linear and Descriptions of Associations: Intact  Orientation:  Other:  Fully alert and attentive  Thought Content:  Denies hallucinations, not internally preoccupied  Suicidal Thoughts:  No at this time denies suicidal ideations and contracts for safety on unit  Homicidal  Thoughts:  No  Memory:  Recent and remote grossly intact  Judgement:  Fair  Insight:  Fair  Psychomotor Activity:  Normal no current psychomotor agitation or restlessness  Concentration:  Concentration: Good and Attention Span: Good  Recall:  Good  Fund of Knowledge:  Good  Language:  Good  Akathisia:  Negative  Handed:  Right  AIMS (if indicated):     Assets:  Communication Skills Desire for Improvement Resilience  ADL's:  Intact  Cognition:  WNL  Sleep:  Number of Hours: 5.5   Assessment:  58 year old male, history of alcohol use disorder, depression, anxiety.  Presented on 11[place]6 after [place].  Admission BAL was 378.  In ED reported depression ,suicidal ideations, auditory hallucinations.  He is known to our unit from prior psychiatric admission in September 2020 at which time was diagnosed with polysubstance dependence, substance-induced mood disorder.  Admission BAL at that time was 401.  At this time patient reports partial improvement compared to admission.  Does endorse some lingering depression and anxiety but acknowledges improvement.  Currently denies SI and contracts for safety on unit.  No current hallucinations and does not appear internally preoccupied.  No significant alcohol withdrawal symptoms noted at this time.  Episode of hypotension earlier today. Of note, patient reports recent opiate use (describes using OxyContin prior to admission) states use was not regular and denies pattern of dependence.  He has a history of being treated with naltrexone for alcohol use disorder.  Due to recent opiate use will hold Depade for now, to minimize possible opiate withdrawal symptoms Treatment Plan Summary: Daily contact with patient to assess and evaluate symptoms and progress in treatment, Medication management, Plan inpatient treatment and medications as below Encourage group and milieu participation Encourage efforts to work on  sobriety/relapse prevention Treatment team working on disposition planning options Continue Librium as needed for alcohol withdrawal as needed Continue thiamine and multivitamin supplementation Continue Neurontin 300 mgrs 3 times daily for anxiety, pain, alcohol use disorder Continue Prozac 20 mg daily for  depression/anxiety Discontinue Depade for now, see rationale above  Decrease Trazodone to 25 mg nightly as needed for insomnia, to minimize potential risk of trazodone induced orthostasis/hypotension Check Vitals ( sitting, standing) Q 8 hours  Encourage PO fluids  Check EKG   Jenne Campus, MD 04/01/2019, 3:50 PM

## 2019-04-02 MED ORDER — ACAMPROSATE CALCIUM 333 MG PO TBEC
666.0000 mg | DELAYED_RELEASE_TABLET | Freq: Three times a day (TID) | ORAL | Status: DC
  Administered 2019-04-02 – 2019-04-05 (×9): 666 mg via ORAL
  Filled 2019-04-02 (×3): qty 2
  Filled 2019-04-02: qty 30
  Filled 2019-04-02 (×6): qty 2
  Filled 2019-04-02 (×2): qty 30
  Filled 2019-04-02 (×2): qty 2
  Filled 2019-04-02 (×2): qty 30
  Filled 2019-04-02: qty 2
  Filled 2019-04-02: qty 30
  Filled 2019-04-02: qty 2

## 2019-04-02 NOTE — [facility] Group Notes (Signed)
Occupational Therapy Group Note  Date:  04/02/2019 Time:  3:02 PM  Group Topic/Focus:  Stress Management  Participation Level:  Active  Participation Quality:  Appropriate  Affect:  Blunted  Cognitive:  Appropriate  Insight: Improving  Engagement in Group:  Engaged  Modes of Intervention:  Activity, Discussion, Education and Socialization  Additional Comments:    S: Fishing is a good coping skill for me  O: Group focus on stress management this date. Pt to engage in group discussion on healthy coping skills to make master list for group to refer from throughout activity. Pt then to create vision board of desired coping skills using magazine cut outs. Plastic scissors with supervision used to maintain safety. Pt then to share vision board with group and if they learned new coping skill this date with discussion.  A: Pt presents with blunted affect, engaged and participatory throughout group. Pt shares how fishing and outdoor activities help relieve stress. Pt active in creating vision board and sharing with others.  P: OT group will be x1 per week while pt inpatient  Zenovia Jarred, MSOT, OTR/L [place] Office: [place] 04/02/2019, 3:02 PM

## 2019-04-02 NOTE — [facility] Group Notes (Signed)
04/02/2019 8:45am Type of Group and Topic: Psychoeducational Group: Discharge Planning  Participation Level: Minimal  Description of Group Discharge planning group reviews patient's anticipated discharge plans and assists patients to anticipate and address any barriers to wellness/recovery in the community. Suicide prevention education is reviewed with patients in group. Therapeutic Goals 1. Patients will state their anticipated discharge plan and mental health aftercare 2. Patients will identify potential barriers to wellness in the community setting 3. Patients will engage in problem solving, solution focused discussion of ways to anticipate and address barriers to wellness/recovery   Summary of Patient Progress: Marcio was in and out of sleep during the group session.   Plan for Discharge/Comments: Patient is interested in residential treatment services, however due to wait lists he will discharge home with outpatient medication management and therapy services.   Transportation Means: To be determined    Supports: Roommates   Therapeutic Modalities: Motivational Interviewing     Radonna Ricker, MSW, [place] Worker [facility]  Phone: [phone] 04/02/2019 4:08 PM

## 2019-04-02 NOTE — Plan of Care (Signed)
  Problem: Activity: Goal: Interest or engagement in activities will improve Outcome: Progressing   Problem: Coping: Goal: Ability to demonstrate self-control will improve Outcome: Progressing   Problem: Safety: Goal: Periods of time without injury will increase Outcome: Progressing   

## 2019-04-02 NOTE — Progress Notes (Signed)
[place] Group Notes:  (Nursing/MHT/Case Management/Adjunct)  Date:  04/02/2019  Time:  2045  Type of Therapy:  wrap up group  Participation Level:  Active  Participation Quality:  Appropriate, Attentive, Sharing and Supportive  Affect:  Appropriate and Depressed  Cognitive:  Appropriate  Insight:  Improving  Engagement in Group:  Engaged  Modes of Intervention:  Clarification, Education and Support  Summary of Progress/Problems: Positive thinking and positive change discussed. Pt admits to being powerless over alcohol, is grateful to be alive, and is open to any treatment to help.   Shellia Cleverly 04/02/2019, 9:51 PM

## 2019-04-02 NOTE — Progress Notes (Signed)
Adult Psychoeducational Group Note  Date:  04/02/2019 Time:  4:18 PM  Group Topic/Focus:  Making Healthy Choices:   The focus of this group is to help patients identify negative/unhealthy choices they were using prior to admission and identify positive/healthier coping strategies to replace them upon discharge.  Participation Level:  Active  Participation Quality:  Appropriate  Affect:  Appropriate  Cognitive:  Alert  Insight: Appropriate  Engagement in Group:  Engaged  Modes of Intervention:  Activity and Discussion  Additional Comments:    Pt participated in group with the MHT. Group focused on positive coping skills and making healthy choices. Pt's were unable to go to the gym today, therefore participated in an exercise activity. Pts took turns picking exercises for the group to do. As a group it was discussed why making healthy choices such as exercising is important, and what the pt needs to change to better their lifestyle.    Lita Mains 04/02/2019, 4:18 PM

## 2019-04-02 NOTE — Progress Notes (Signed)
Patient ID: Andrew Rivas, male   DOB: [birth year], 58 y.o.   MRN: [id number]   [place] NOVEL CORONAVIRUS (COVID-19) DAILY CHECK-OFF SYMPTOMS - answer yes or no to each - every day NO YES  Have you had a fever in the past 24 hours?  . Fever (Temp > 37.80C / 100F) X   Have you had any of these symptoms in the past 24 hours? . New Cough .  Sore Throat  .  Shortness of Breath .  Difficulty Breathing .  Unexplained Body Aches   X   Have you had any one of these symptoms in the past 24 hours not related to allergies?   . Runny Nose .  Nasal Congestion .  Sneezing   X   If you have had runny nose, nasal congestion, sneezing in the past 24 hours, has it worsened?  X   EXPOSURES - check yes or no X   Have you traveled outside the state in the past 14 days?  X   Have you been in contact with someone with a confirmed diagnosis of COVID-19 or PUI in the past 14 days without wearing appropriate PPE?  X   Have you been living in the same home as a person with confirmed diagnosis of COVID-19 or a PUI (household contact)?    X   Have you been diagnosed with COVID-19?    X              What to do next: Answered NO to all: Answered YES to anything:   Proceed with unit schedule Follow the [facility] Inpatient Flowsheet.

## 2019-04-02 NOTE — Progress Notes (Signed)
[facility] MD Progress Note  04/02/2019 3:51 PM Andrew Rivas  MRN:  [id number] Subjective: Patient reports some improvement compared to admission but states that he still feels depressed.  He endorses some passive SI although denies any suicidal plan or intention and contracts for safety.  He is future oriented and expressing interest in going to a rehab at discharge. Objective : I have reviewed case with treatment team and met with patient. 58 year old male, history of alcohol use disorder, depression, anxiety.  [place]ed on 11[place]6 [place]  Admission BAL was 378.  In ED reported depression ,suicidal ideations, auditory hallucinations.  He is known to our unit from prior psychiatric admission in September 2020 at which time was diagnosed with polysubstance dependence, substance-induced mood disorder.  Admission BAL at that time was 401.  Currently patient presents alert, attentive, vaguely anxious .  Endorses lingering depression although acknowledges some improvement compared to admission.  Describes intermittent passive thoughts of death without any actual plan or intention and as noted is currently future oriented, reports he is interested in going to a rehab at discharge. He endorses intermittent cravings for alcohol.  We discussed options.  He is interested in Campral.  (Creatinine clearance 98.6). No disruptive or agitated behaviors on unit, visible in dayroom, going to some groups. Describes some subjective/residual symptoms of alcohol withdrawal.  Reports he feels vaguely "jittery".  Currently does not appear tremulous or in any acute distress.  No tachycardia. Denies medication side effects. Principal Problem: Substance induced mood disorder (HCC) Diagnosis: Principal Problem:   Substance induced mood disorder (HCC) Active Problems:   Alcohol dependence ([place])  Total Time spent with patient: 20 minutes  Past Psychiatric History:   Past Medical  History:  Past Medical History:  Diagnosis Date  . Alcohol abuse   . Medical history non-contributory     Past Surgical History:  Procedure Laterality Date  . HAND SURGERY Left   . NO PAST SURGERIES     Family History: History reviewed. No pertinent family history. Family Psychiatric  History:  Social History:  Social History   Substance and Sexual Activity  Alcohol Use Yes   Comment: daily      Social History   Substance and Sexual Activity  Drug Use Yes  . Types: Marijuana, Oxycodone    Social History   Socioeconomic History  . Marital status: Single    Spouse name: Not on file  . Number of children: Not on file  . Years of education: Not on file  . Highest education level: Not on file  Occupational History  . Not on file  Social Needs  . Financial resource strain: Not on file  . Food insecurity    Worry: Not on file    Inability: Not on file  . Transportation needs    Medical: Not on file    Non-medical: Not on file  Tobacco Use  . Smoking status: Current Every Day Smoker    Packs/day: 1.00  . Smokeless tobacco: Never Used  Substance and Sexual Activity  . Alcohol use: Yes    Comment: daily   . Drug use: Yes    Types: Marijuana, Oxycodone  . Sexual activity: Not Currently    Birth control/protection: None  Lifestyle  . Physical activity    Days per week: Not on file    Minutes per session: Not on file  . Stress: Not on file  Relationships  . Social connections    Talks  on phone: Not on file    Gets together: Not on file    Attends religious service: Not on file    Active member of club or organization: Not on file    Attends meetings of clubs or organizations: Not on file    Relationship status: Not on file  Other Topics Concern  . Not on file  Social History Narrative   ** Merged History Encounter **       Additional Social History:   Sleep: Fair/improving  Appetite:  Improving  Current Medications: Current Facility-Administered  Medications  Medication Dose Route Frequency Provider Last Rate Last Dose  . acetaminophen (TYLENOL) tablet 650 mg  650 mg Oral Q6H PRN Emmaline Kluver, FNP   650 mg at 04/02/19 1209  . alum & mag hydroxide-simeth (MAALOX/MYLANTA) 200-200-20 MG/5ML suspension 30 mL  30 mL Oral Q4H PRN Emmaline Kluver, FNP      . feeding supplement (ENSURE ENLIVE) (ENSURE ENLIVE) liquid 237 mL  237 mL Oral BID BM Nwoko, Agnes I, NP   237 mL at 03/30/19 1637  . FLUoxetine (PROZAC) capsule 20 mg  20 mg Oral Daily Emmaline Kluver, FNP   20 mg at 04/02/19 0755  . gabapentin (NEURONTIN) capsule 300 mg  300 mg Oral TID Emmaline Kluver, FNP   300 mg at 04/02/19 1152  . magnesium hydroxide (MILK OF MAGNESIA) suspension 30 mL  30 mL Oral Daily PRN Emmaline Kluver, FNP      . multivitamin with minerals tablet 1 tablet  1 tablet Oral Daily Emmaline Kluver, FNP   1 tablet at 04/02/19 0755  . pantoprazole (PROTONIX) EC tablet 40 mg  40 mg Oral Daily Emmaline Kluver, FNP   40 mg at 04/02/19 0756  . thiamine (B-1) injection 100 mg  100 mg Intramuscular Once Emmaline Kluver, FNP      . thiamine (VITAMIN B-1) tablet 100 mg  100 mg Oral Daily Emmaline Kluver, FNP   100 mg at 04/02/19 0756  . traZODone (DESYREL) tablet 25 mg  25 mg Oral QHS PRN , Myer Peer, MD   25 mg at 04/01/19 2142    Lab Results: No results found for this or any previous visit (from the past 48 hour(s)).  Blood Alcohol level:  Lab Results  Component Value Date   ETH 378 (HH) 03/28/2019   ETH 287 (H) 08/81/1031    Metabolic Disorder Labs: Lab Results  Component Value Date   HGBA1C 5.6 01/23/2019   MPG 114.02 01/23/2019   No results found for: PROLACTIN Lab Results  Component Value Date   CHOL 205 (H) 01/23/2019   TRIG 40 01/23/2019   HDL 119 01/23/2019   CHOLHDL 1.7 01/23/2019   VLDL 8 01/23/2019   LDLCALC 78 01/23/2019    Physical Findings: AIMS: Facial and Oral Movements Muscles of Facial Expression: None, normal Lips and Perioral Area: None, normal Jaw:  None, normal Tongue: None, normal,Extremity Movements Upper (arms, wrists, hands, fingers): None, normal Lower (legs, knees, ankles, toes): None, normal, Trunk Movements Neck, shoulders, hips: None, normal, Overall Severity Severity of abnormal movements (highest score from questions above): None, normal Incapacitation due to abnormal movements: None, normal Patient's awareness of abnormal movements (rate only patient's report): No Awareness, Dental Status Current problems with teeth and/or dentures?: No Does patient usually wear dentures?: No  CIWA:  CIWA-Ar Total: 1 COWS:  COWS Total Score: 1  Musculoskeletal: Strength & Muscle Tone: within normal limits no significant  psychomotor agitation or restlessness, no tremors or diaphoresis noted at this time. Gait & Station: normal Patient leans: N/A  Psychiatric Specialty Exam: Physical Exam  ROS no chest pain, no shortness of breath, no vomiting, no fever, no chills  Blood pressure 94/71, pulse 70, temperature 97.6 F (36.4 C), temperature source Oral, resp. rate 18, height '6\' 1"'$  (1.854 m), weight 67.6 kg, SpO2 95 %.Body mass index is 19.66 kg/m.  General Appearance: Improving grooming  Eye Contact:  Good  Speech:  Normal Rate  Volume:  Normal  Mood:  Reports some ongoing depression but does acknowledge mood has improved compared to admission  Affect:  Vaguely anxious/constricted.  Smiles briefly at times  Thought Process:  Linear and Descriptions of Associations: Intact  Orientation:  Other:  Fully alert and attentive  Thought Content:  Denies hallucinations, not internally preoccupied  Suicidal Thoughts:  No at this time denies suicidal ideations and contracts for safety on unit  Homicidal Thoughts:  No  Memory:  Recent and remote grossly intact  Judgement:  Fair/improving  Insight:  Fair/improving  Psychomotor Activity:  Normal no current psychomotor agitation or restlessness  Concentration:  Concentration: Good and Attention  Span: Good  Recall:  Good  Fund of Knowledge:  Good  Language:  Good  Akathisia:  Negative  Handed:  Right  AIMS (if indicated):     Assets:  Communication Skills Desire for Improvement Resilience  ADL's:  Intact  Cognition:  WNL  Sleep:  Number of Hours: 6   Assessment:  58 year old male, history of alcohol use disorder, depression, anxiety.  Presented on 11/6 after [place] shopping [place].  Admission BAL was 378.  In ED reported depression ,suicidal ideations, auditory hallucinations.  He is known to our unit from prior psychiatric admission in September 2020 at which time was diagnosed with polysubstance dependence, substance-induced mood disorder.  Admission BAL at that time was 401.  Patient reports lingering/persistent symptoms of depression, although acknowledges overall improvement compared to admission.  Describes intermittent passive SI without plan or intention.  Contracts for safety on unit.  Presents future oriented and is expressing interest in going to a rehab at discharge.  No significant withdrawal symptoms noted at this time.  Tolerating medications well thus far. Expresses interest in Campral to help address alcohol cravings.  (He had been on naltrexone prior to admission.  We opted to discontinue this medication for now based on report of concomitant use of opiate analgesics and as this medication could potentially  contribute to/worsen depression. ) Treatment Plan Summary: Daily contact with patient to assess and evaluate symptoms and progress in treatment, Medication management, Plan inpatient treatment and medications as below Encourage group and milieu participation Encourage efforts to work on sobriety/relapse prevention Treatment team working on disposition planning options Continue Librium as needed for alcohol withdrawal as needed Continue thiamine and multivitamin supplementation Continue Neurontin 300 mgrs 3 times daily for anxiety, pain,  alcohol use disorder Continue Prozac 20 mg daily for depression/anxiety Start Campral 666 mgrs TID for alcohol use disorder Continue Trazodone  25 mg nightly as needed for insomnia   Jenne Campus, MD 04/02/2019, 3:51 PM   Patient ID: Andrew Rivas, male   DOB: 1961-05-16, 58 y.o.   MRN: [id number]

## 2019-04-03 MED ORDER — MIRTAZAPINE 7.5 MG PO TABS
7.5000 mg | ORAL_TABLET | Freq: Every day | ORAL | Status: DC
  Administered 2019-04-03 – 2019-04-04 (×2): 7.5 mg via ORAL
  Filled 2019-04-03: qty 7
  Filled 2019-04-03: qty 1
  Filled 2019-04-03: qty 7
  Filled 2019-04-03 (×2): qty 1

## 2019-04-03 NOTE — Progress Notes (Signed)
Patient was engaged and participated in the [place] De[place]raphenreid, CPSS and Mason Jim, CPSS led [place] ([facility]) group located in the 300 hall day room. Patient talked about his history of alcohol addiction and identified triggers of what leads him to use alcohol. Patient also talked about his main wellness tool of wanting to go to residential substance use treatment center after [facility] admission as well as wanting to gain access to a new support network to help with his substance use recovery process.

## 2019-04-03 NOTE — Progress Notes (Signed)
   04/02/19 2300  Psych Admission Type (Psych Patients Only)  Admission Status Voluntary  Psychosocial Assessment  Patient Complaints Depression  Eye Contact Fair  Facial Expression Flat  Affect Appropriate to circumstance  Speech Logical/coherent  Interaction Minimal  Motor Activity Other (Comment) (steady gait)  Appearance/Hygiene Unremarkable  Behavior Characteristics Cooperative  Mood Depressed;Pleasant  Thought Process  Coherency WDL  Content WDL  Delusions None reported or observed  Perception WDL  Hallucination None reported or observed  Judgment Impaired  Confusion None  Danger to Self  Current suicidal ideation? Denies  Danger to Others  Danger to Others None reported or observed   Pt. States that he is happy that he woke up with better thoughts reporting they are positive and his happy to be alive and back on his medication.  Pt. States he wants to change his alcoholism, states he is weak and needs to work on being honest with himself.  Pt. States that he is thankful for being here and being alive.

## 2019-04-03 NOTE — Progress Notes (Signed)
Adult Psychoeducational Group Note  Date:  04/03/2019 Time:  10:53 AM  Group Topic/Focus:  Crisis Planning:   The purpose of this group is to help patients create a crisis plan for use upon discharge or in the future, as needed.  Participation Level:  Active  Participation Quality:  Attentive  Affect:  Appropriate  Cognitive:  Alert  Insight: Good  Engagement in Group:  Engaged  Modes of Intervention:  Activity and Discussion  Additional Comments:    Pt participated in group with the MHT. Group started with an icebreaker activity using the question ball. Today's topic for the day is crisis prevention. Each pt shared what crisis brought them to the hospital and how they will prevent a similar crisis from occurring in the future. Pt shared triggers and coping skills for his given crisis.  Lita Mains 04/03/2019, 10:53 AM

## 2019-04-03 NOTE — Progress Notes (Signed)
Adult Psychoeducational Group Note  Date:  04/03/2019 Time:  9:24 PM  Group Topic/Focus:  Wrap-Up Group:   The focus of this group is to help patients review their daily goal of treatment and discuss progress on daily workbooks.  Participation Level:  Active  Participation Quality:  Appropriate  Affect:  Appropriate  Cognitive:  Alert  Insight: Appropriate  Engagement in Group:  Engaged  Modes of Intervention:  Discussion  Additional Comments:  Pt stated that his goal for today was to attend group. Pt stated that he did accomplish this goal by sharing his reason for being admitted to the hospital. Pt stated that one new coping skill that he has learnt since being in the hospital is playing cards.   Wynelle Fanny R 04/03/2019, 9:24 PM

## 2019-04-03 NOTE — Progress Notes (Signed)
DAR NOTE: Patient presents with anxious affect and depressed mood.  Report passive SI but verbally contracts for safety.  Denies pain, auditory and visual hallucinations.  Rates depression at 9, hopelessness at 9, and anxiety at 9.  Reports cravings on self inventory form.  Maintained on routine safety checks.  Medications given as prescribed.  Support and encouragement offered as needed.  Attended group and participated.  States goal for today is "my depression and anxiety."  Patient observed socializing with peers in the dayroom.  Patient is safe on and off the unit.

## 2019-04-03 NOTE — Progress Notes (Signed)
Dar Note: Patient is calm and appropriate on the unit.  Patient visible in milieu interacting with peers.  Reports passive SI but verbally contracts for safety.  Medication given as prescribed.  Routine safety checks maintained every 15 minutes.  Patient is safe on the unit.

## 2019-04-03 NOTE — Progress Notes (Signed)
[facility] MD Progress Note  04/03/2019 1:22 PM Andrew Rivas  MRN:  [id number] Subjective: Patient describes lingering depression and anxiety although acknowledges improvement.  Today denies suicidal ideations.  Currently focused on disposition planning options.  States he intends to return to live with roommates following discharge but states he is unsure whether he will be accepted back.  He states he plans to make phone calls today in order to clarify this. Does not endorse medication side effects. Objective : I have reviewed case with treatment team and met with patient. 58 year old male, history of alcohol use disorder, depression, anxiety.  Presented on 11/6 [place].  Admission BAL was 378.  In ED reported depression ,suicidal ideations, auditory hallucinations.  He is known to our unit from prior psychiatric admission in September 2020 at which time was diagnosed with polysubstance dependence, substance-induced mood disorder.  Admission BAL at that time was 401.  Patient presents vaguely depressed/anxious.  Does acknowledge improvement compared to admission.  Today denies suicidal ideations and contracts for safety on unit.  As above, currently concerned regarding being able to return to his place of residence following discharge.  States he is unsure whether his roommates will allow him back. No disruptive or agitated behaviors on unit, going to some groups, polite on approach. At this time does not present with significant alcohol withdrawal symptoms.  No significant tremors, no diaphoresis, no overt psychomotor restlessness, vitals are stable. Denies medication side effects.  Principal Problem: Substance induced mood disorder (HCC) Diagnosis: Principal Problem:   Substance induced mood disorder (HCC) Active Problems:   Alcohol dependence ([place])  Total Time spent with patient: 20 minutes  Past Psychiatric History:   Past Medical History:  Past  Medical History:  Diagnosis Date  . Alcohol abuse   . Medical history non-contributory     Past Surgical History:  Procedure Laterality Date  . HAND SURGERY Left   . NO PAST SURGERIES     Family History: History reviewed. No pertinent family history. Family Psychiatric  History:  Social History:  Social History   Substance and Sexual Activity  Alcohol Use Yes   Comment: daily      Social History   Substance and Sexual Activity  Drug Use Yes  . Types: Marijuana, Oxycodone    Social History   Socioeconomic History  . Marital status: Single    Spouse name: Not on file  . Number of children: Not on file  . Years of education: Not on file  . Highest education level: Not on file  Occupational History  . Not on file  Social Needs  . Financial resource strain: Not on file  . Food insecurity    Worry: Not on file    Inability: Not on file  . Transportation needs    Medical: Not on file    Non-medical: Not on file  Tobacco Use  . Smoking status: Current Every Day Smoker    Packs/day: 1.00  . Smokeless tobacco: Never Used  Substance and Sexual Activity  . Alcohol use: Yes    Comment: daily   . Drug use: Yes    Types: Marijuana, Oxycodone  . Sexual activity: Not Currently    Birth control/protection: None  Lifestyle  . Physical activity    Days per week: Not on file    Minutes per session: Not on file  . Stress: Not on file  Relationships  . Social Herbalist on phone:  Not on file    Gets together: Not on file    Attends religious service: Not on file    Active member of club or organization: Not on file    Attends meetings of clubs or organizations: Not on file    Relationship status: Not on file  Other Topics Concern  . Not on file  Social History Narrative   ** Merged History Encounter **       Additional Social History:   Sleep: Fair  Appetite:  Improving  Current Medications: Current Facility-Administered Medications  Medication Dose  Route Frequency Provider Last Rate Last Dose  . acamprosate (CAMPRAL) tablet 666 mg  666 mg Oral TID WC Cobos, Myer Peer, MD   666 mg at 04/03/19 1238  . acetaminophen (TYLENOL) tablet 650 mg  650 mg Oral Q6H PRN Emmaline Kluver, FNP   650 mg at 04/02/19 1209  . alum & mag hydroxide-simeth (MAALOX/MYLANTA) 200-200-20 MG/5ML suspension 30 mL  30 mL Oral Q4H PRN Emmaline Kluver, FNP      . feeding supplement (ENSURE ENLIVE) (ENSURE ENLIVE) liquid 237 mL  237 mL Oral BID BM Nwoko, Agnes I, NP   237 mL at 03/30/19 1637  . FLUoxetine (PROZAC) capsule 20 mg  20 mg Oral Daily Emmaline Kluver, FNP   20 mg at 04/03/19 7616  . gabapentin (NEURONTIN) capsule 300 mg  300 mg Oral TID Emmaline Kluver, FNP   300 mg at 04/03/19 1238  . magnesium hydroxide (MILK OF MAGNESIA) suspension 30 mL  30 mL Oral Daily PRN Emmaline Kluver, FNP      . mirtazapine (REMERON) tablet 7.5 mg  7.5 mg Oral QHS Cobos, Myer Peer, MD      . multivitamin with minerals tablet 1 tablet  1 tablet Oral Daily Emmaline Kluver, FNP   1 tablet at 04/03/19 0737  . pantoprazole (PROTONIX) EC tablet 40 mg  40 mg Oral Daily Emmaline Kluver, FNP   40 mg at 04/03/19 1062  . thiamine (B-1) injection 100 mg  100 mg Intramuscular Once Emmaline Kluver, FNP      . thiamine (VITAMIN B-1) tablet 100 mg  100 mg Oral Daily Emmaline Kluver, FNP   100 mg at 04/03/19 6948    Lab Results: No results found for this or any previous visit (from the past 48 hour(s)).  Blood Alcohol level:  Lab Results  Component Value Date   ETH 378 (HH) 03/28/2019   ETH 287 (H) 54/62/7035    Metabolic Disorder Labs: Lab Results  Component Value Date   HGBA1C 5.6 01/23/2019   MPG 114.02 01/23/2019   No results found for: PROLACTIN Lab Results  Component Value Date   CHOL 205 (H) 01/23/2019   TRIG 40 01/23/2019   HDL 119 01/23/2019   CHOLHDL 1.7 01/23/2019   VLDL 8 01/23/2019   LDLCALC 78 01/23/2019    Physical Findings: AIMS: Facial and Oral Movements Muscles of Facial Expression:  None, normal Lips and Perioral Area: None, normal Jaw: None, normal Tongue: None, normal,Extremity Movements Upper (arms, wrists, hands, fingers): None, normal Lower (legs, knees, ankles, toes): None, normal, Trunk Movements Neck, shoulders, hips: None, normal, Overall Severity Severity of abnormal movements (highest score from questions above): None, normal Incapacitation due to abnormal movements: None, normal Patient's awareness of abnormal movements (rate only patient's report): No Awareness, Dental Status Current problems with teeth and/or dentures?: No Does patient usually wear dentures?: No  CIWA:  CIWA-Ar Total:  2 COWS:  COWS Total Score: 1  Musculoskeletal: Strength & Muscle Tone: within normal limits no significant psychomotor agitation or restlessness, no tremors or diaphoresis noted at this time. Gait & Station: normal Patient leans: N/A  Psychiatric Specialty Exam: Physical Exam  ROS no chest pain, no shortness of breath, no vomiting, no fever, no chills  Blood pressure 124/74, pulse 74, temperature 97.9 F (36.6 C), resp. rate 18, height 6' 1" (1.854 m), weight 67.6 kg, SpO2 94 %.Body mass index is 19.66 kg/m.  General Appearance: Fairly groomed  Eye Contact:  Good  Speech:  Normal Rate  Volume:  Normal  Mood:  Partial improvement, remains depressed/anxious, does acknowledge improvement compared to admission  Affect:  Congruent  Thought Process:  Linear and Descriptions of Associations: Intact  Orientation:  Other:  Fully alert and attentive  Thought Content:  Denies hallucinations, not internally preoccupied  Suicidal Thoughts:  No at this time denies suicidal ideations and contracts for safety on unit  Homicidal Thoughts:  No  Memory:  Recent and remote grossly intact  Judgement:  Fair/improving  Insight:  Fair/improving  Psychomotor Activity:  Normal no current psychomotor agitation or restlessness  Concentration:  Concentration: Good and Attention Span: Good   Recall:  Good  Fund of Knowledge:  Good  Language:  Good  Akathisia:  Negative  Handed:  Right  AIMS (if indicated):     Assets:  Communication Skills Desire for Improvement Resilience  ADL's:  Intact  Cognition:  WNL  Sleep:  Number of Hours: 4   Assessment:  58 year old male, history of alcohol use disorder, depression, anxiety.  Presented on 11[place]6 [place].  Admission BAL was 378.  In ED reported depression ,suicidal ideations, auditory hallucinations.  He is known to our unit from prior psychiatric admission in September 2020 at which time was diagnosed with polysubstance dependence, substance-induced mood disorder.  Admission BAL at that time was 401.  Patient reports partial improvement but describes lingering depression and anxiety.  Attributes anxiety in part to concerns regarding disposition options.  He states he is unsure whether he can return to his prior place of residence and needs to clear this with roommates.  He denies suicidal or self-injurious ideations at this time, presents future oriented and contracts for safety, staff reports that he has endorsed passive SI recently.  Tolerating medications well. Based on persistent depression/lingering insomnia we discussed adding Remeron to further address symptoms.  Side effects discussed.  Patient agrees. Treatment Plan Summary: Daily contact with patient to assess and evaluate symptoms and progress in treatment, Medication management, Plan inpatient treatment and medications as below  Treatment plan reviewed as below today 11/12 Encourage group and milieu participation Encourage efforts to work on sobriety/relapse prevention Treatment team working on disposition planning options Continue Librium as needed for alcohol withdrawal as needed Continue thiamine and multivitamin supplementation Continue Neurontin 300 mgrs 3 times daily for anxiety, pain, alcohol use disorder Continue Prozac  20 mg daily for depression/anxiety Continue Campral 666 mgrs TID for alcohol use disorder Discontinue Trazodone  Start Remeron 7.5 mgrs QHS for depression, anxiety, insomnia   Jenne Campus, MD 04/03/2019, 1:22 PM   Patient ID: Leodis Binet, male   DOB: 05-01-1961, 58 y.o.   MRN: [id number] Patient ID: TIGE MEAS, male   DOB: 08-14-1960, 58 y.o.   MRN: [id number]

## 2019-04-04 MED ORDER — HYDROXYZINE HCL 25 MG PO TABS
25.0000 mg | ORAL_TABLET | Freq: Three times a day (TID) | ORAL | Status: DC | PRN
  Administered 2019-04-04 – 2019-04-05 (×2): 25 mg via ORAL
  Filled 2019-04-04 (×2): qty 1
  Filled 2019-04-04: qty 10

## 2019-04-04 NOTE — Progress Notes (Signed)
Patient ID: Andrew Rivas, male   DOB: Oct 28, 1960, 58 y.o.   MRN: [id number] D: Assumed care patient @ 2330. Patient in bed sleeping. Respiration regular and unlabored. No sign of distress noted at this time A: 15 mins checks for safety. R: Patient remains safe.

## 2019-04-04 NOTE — Progress Notes (Addendum)
[facility] MD Progress Note  04/04/2019 9:35 AM Andrew Rivas  MRN:  [id number]   Subjective: "I am o'kay. Just my anxiety is going crazy right now."  Andrew Rivas is a 58 y.o. male with a history of alcohol use disorder, depression, anxiety.  Presented on 11[place]6 [place].  Admission BAL was 378.  In ED reported depression ,suicidal ideations, auditory hallucinations.  He is known to our unit from prior psychiatric admission in September 2020 at which time was diagnosed with polysubstance dependence, substance-induced mood disorder.  Admission BAL at that time was 401.  Patient reports that depression and anxiety have improved. States that he currently feels very anxious and is requesting hydroxyzine. States that he is having some mild alcohol cravings, but feels that the Campral is beginning to work. He reports a mild tremor. Denies diaphoresis, hallucinations, nausea, headache. He denies suicidal thouhgts and states that he can contract for safety while in the hospital. States that he slept well last night and feels that Remeron was beneficial and that he had the best sleep he has had in a while. States that his roommate has agreed for him to return home tomorrow. States that he would like residential substance abuse treatment but is interested in outpatient treatment until he can arrange residential treatment. States that he plans to start looking for a job after discharge.   On evaluation patient is alert and oriented x 4, pleasant, and cooperative. Speech is clear and coherent. Mood is anxious. Affect appears vaguely anxious. Smiles appropriately at times. He is goal directed and future oriented. Thought process is coherent, linear, and descriptions of association are intact. Denies audiovisual hallucinations. No indication that patient is responding to internal stimuli. Denies suicidal ideations. Denies homicidal ideations.  Principal Problem: Substance  induced mood disorder (HCC) Diagnosis: Principal Problem:   Substance induced mood disorder (HCC) Active Problems:   Alcohol dependence ([place])  Total Time spent with patient: 20 minutes  Past Psychiatric History:   Past Medical History:  Past Medical History:  Diagnosis Date  . Alcohol abuse   . Medical history non-contributory     Past Surgical History:  Procedure Laterality Date  . HAND SURGERY Left   . NO PAST SURGERIES     Family History: History reviewed. No pertinent family history. Family Psychiatric  History:  Social History:  Social History   Substance and Sexual Activity  Alcohol Use Yes   Comment: daily      Social History   Substance and Sexual Activity  Drug Use Yes  . Types: Marijuana, Oxycodone    Social History   Socioeconomic History  . Marital status: Single    Spouse name: Not on file  . Number of children: Not on file  . Years of education: Not on file  . Highest education level: Not on file  Occupational History  . Not on file  Social Needs  . Financial resource strain: Not on file  . Food insecurity    Worry: Not on file    Inability: Not on file  . Transportation needs    Medical: Not on file    Non-medical: Not on file  Tobacco Use  . Smoking status: Current Every Day Smoker    Packs/day: 1.00  . Smokeless tobacco: Never Used  Substance and Sexual Activity  . Alcohol use: Yes    Comment: daily   . Drug use: Yes    Types: Marijuana, Oxycodone  . Sexual activity:  Not Currently    Birth control/protection: None  Lifestyle  . Physical activity    Days per week: Not on file    Minutes per session: Not on file  . Stress: Not on file  Relationships  . Social Herbalist on phone: Not on file    Gets together: Not on file    Attends religious service: Not on file    Active member of club or organization: Not on file    Attends meetings of clubs or organizations: Not on file    Relationship status: Not on file  Other  Topics Concern  . Not on file  Social History Narrative   ** Merged History Encounter **       Additional Social History:   Sleep: Good  Appetite:  Good  Current Medications: Current Facility-Administered Medications  Medication Dose Route Frequency Provider Last Rate Last Dose  . acamprosate (CAMPRAL) tablet 666 mg  666 mg Oral TID WC Suzetta Timko, Myer Peer, MD   666 mg at 04/04/19 0807  . acetaminophen (TYLENOL) tablet 650 mg  650 mg Oral Q6H PRN Emmaline Kluver, FNP   650 mg at 04/02/19 1209  . alum & mag hydroxide-simeth (MAALOX/MYLANTA) 200-200-20 MG/5ML suspension 30 mL  30 mL Oral Q4H PRN Emmaline Kluver, FNP      . feeding supplement (ENSURE ENLIVE) (ENSURE ENLIVE) liquid 237 mL  237 mL Oral BID BM Nwoko, Agnes I, NP   237 mL at 03/30/19 1637  . FLUoxetine (PROZAC) capsule 20 mg  20 mg Oral Daily Emmaline Kluver, FNP   20 mg at 04/04/19 7564  . gabapentin (NEURONTIN) capsule 300 mg  300 mg Oral TID Emmaline Kluver, FNP   300 mg at 04/04/19 3329  . magnesium hydroxide (MILK OF MAGNESIA) suspension 30 mL  30 mL Oral Daily PRN Emmaline Kluver, FNP      . mirtazapine (REMERON) tablet 7.5 mg  7.5 mg Oral QHS Kamareon Sciandra, Myer Peer, MD   7.5 mg at 04/03/19 2128  . multivitamin with minerals tablet 1 tablet  1 tablet Oral Daily Emmaline Kluver, FNP   1 tablet at 04/04/19 5188  . pantoprazole (PROTONIX) EC tablet 40 mg  40 mg Oral Daily Emmaline Kluver, FNP   40 mg at 04/04/19 4166  . thiamine (B-1) injection 100 mg  100 mg Intramuscular Once Emmaline Kluver, FNP      . thiamine (VITAMIN B-1) tablet 100 mg  100 mg Oral Daily Emmaline Kluver, FNP   100 mg at 04/04/19 0630    Lab Results: No results found for this or any previous visit (from the past 49 hour(s)).  Blood Alcohol level:  Lab Results  Component Value Date   ETH 378 (HH) 03/28/2019   ETH 287 (H) 16/05/930    Metabolic Disorder Labs: Lab Results  Component Value Date   HGBA1C 5.6 01/23/2019   MPG 114.02 01/23/2019   No results found for:  PROLACTIN Lab Results  Component Value Date   CHOL 205 (H) 01/23/2019   TRIG 40 01/23/2019   HDL 119 01/23/2019   CHOLHDL 1.7 01/23/2019   VLDL 8 01/23/2019   LDLCALC 78 01/23/2019    Physical Findings: AIMS: Facial and Oral Movements Muscles of Facial Expression: None, normal Lips and Perioral Area: None, normal Jaw: None, normal Tongue: None, normal,Extremity Movements Upper (arms, wrists, hands, fingers): None, normal Lower (legs, knees, ankles, toes): None, normal, Trunk Movements Neck, shoulders, hips:  None, normal, Overall Severity Severity of abnormal movements (highest score from questions above): None, normal Incapacitation due to abnormal movements: None, normal Patient's awareness of abnormal movements (rate only patient's report): No Awareness, Dental Status Current problems with teeth and/or dentures?: No Does patient usually wear dentures?: No  CIWA:  CIWA-Ar Total: 0 COWS:  COWS Total Score: 1  Musculoskeletal: Strength & Muscle Tone: within normal limits no significant psychomotor agitation or restlessness, no tremors or diaphoresis noted at this time. Gait & Station: normal Patient leans: N/A  Psychiatric Specialty Exam: Physical Exam  ROS no chest pain, no shortness of breath, no vomiting, no fever, no chills  Blood pressure 103/72, pulse 74, temperature 97.9 F (36.6 C), resp. rate 18, height '6\' 1"'$  (1.854 m), weight 67.6 kg, SpO2 94 %.Body mass index is 19.66 kg/m.  General Appearance: Fairly groomed  Eye Contact:  Good  Speech:  Normal Rate  Volume:  Normal  Mood:  Partial improvement, remains depressed/anxious, does acknowledge improvement compared to admission  Affect:  Congruent  Thought Process:  Linear and Descriptions of Associations: Intact  Orientation:  Other:  Fully alert and attentive  Thought Content:  Denies hallucinations, not internally preoccupied  Suicidal Thoughts:  No at this time denies suicidal ideations and contracts for safety  on unit  Homicidal Thoughts:  No  Memory:  Recent and remote grossly intact  Judgement:  Fair/improving  Insight:  Fair  Psychomotor Activity:  Normal no current psychomotor agitation or restlessness  Concentration:  Concentration: Good and Attention Span: Good  Recall:  Good  Fund of Knowledge:  Good  Language:  Good  Akathisia:  Negative  Handed:  Right  AIMS (if indicated):     Assets:  Communication Skills Desire for Improvement Resilience  ADL's:  Intact  Cognition:  WNL  Sleep:  Number of Hours: 6.25    Treatment Plan Summary: Daily contact with patient to assess and evaluate symptoms and progress in treatment, Medication management, Plan inpatient treatment and medications as below   Treatment plan reviewed as below today 11/13 Encourage group and milieu participation Encourage efforts to work on sobriety/relapse prevention Treatment team working on disposition planning options Continue Librium as needed for alcohol withdrawal as needed Continue thiamine and multivitamin supplementation Continue Neurontin 300 mgrs 3 times daily for anxiety, pain, alcohol use disorder Continue Prozac 20 mg daily for depression/anxiety Continue Campral 666 mgrs TID for alcohol use disorder Continue Remeron 7.5 mgrs QHS for depression, anxiety, insomnia Start hydroxyzine 25 mg TID prn anxiety   Rozetta Nunnery, NP 04/04/2019, 9:35 AM   I have discussed case with treatment team, with Corene Cornea, NP, and have also met with patient. Patient describes gradual improvement and acknowledges feeling better than on admission.  Reports persistent/ongoing anxiety.  Denies suicidal or self-injurious ideations.  Denies medication side effects. Interested in hydroxyzine on a as needed basis for anxiety. He had been concerned that his roommates (with whom he had  been living prior to admission) would not allow him to return after discharge but states that he will be able to do so. Vitals are currently  stable.  Denies medication side effects Plan is to continue current medication regimen.  We will add Vistaril 25 mg every 8 hours as needed for anxiety as needed (01/20/2019 EKG NSR, QTc 454).  Gabriel Earing MD

## 2019-04-04 NOTE — Progress Notes (Signed)
   04/04/19 1200  Psych Admission Type (Psych Patients Only)  Admission Status Voluntary  Psychosocial Assessment  Patient Complaints Substance abuse  Eye Contact Fair  Facial Expression Flat  Affect Appropriate to circumstance  Speech Logical/coherent  Interaction Assertive  Motor Activity Other (Comment) (WDL)  Appearance/Hygiene Unremarkable  Behavior Characteristics Cooperative  Mood Anxious  Thought Process  Coherency WDL  Content WDL  Delusions None reported or observed  Perception WDL  Hallucination None reported or observed  Judgment Poor  Confusion None  Danger to Self  Current suicidal ideation? Denies  Danger to Others  Danger to Others None reported or observed   Pt has been out in the dayroom interacting with patients. Pt verbally denies si and hi.

## 2019-04-04 NOTE — [facility] Group Notes (Signed)
LCSW Group Therapy Note  04/04/2019 3:23 PM  Type of Therapy/Topic: Group Therapy: Feelings about Diagnosis  Participation Level: Active   Description of Group:  This group Andrew Rivas allow patients to explore their thoughts and feelings about diagnoses they have received. Patients Andrew Rivas be guided to explore their level of understanding and acceptance of these diagnoses. Facilitator Andrew Rivas encourage patients to process their thoughts and feelings about the reactions of others to their diagnosis and Andrew Rivas guide patients in identifying ways to discuss their diagnosis with significant others in their lives. This group Andrew Rivas be process-oriented, with patients participating in exploration of their own experiences, giving and receiving support, and processing challenge from other group members.  Therapeutic Goals: 1. Patient Andrew Rivas demonstrate understanding of diagnosis as evidenced by identifying two or more symptoms of the disorder 2. Patient Andrew Rivas be able to express two feelings regarding the diagnosis 3. Patient Andrew Rivas demonstrate their ability to communicate their needs through discussion and/or role play  Summary of Patient Progress: Andrew Rivas was active throughout group and offered encouragement to his peers. Andrew Rivas shared his experience in living with bipolar disorder and alcoholism; and related his experience to his father's who shared his diagnosis. He asked questions about outpatient follow up and treatment options.   Therapeutic Modalities:  Cognitive Behavioral Therapy Brief Therapy Feelings Identification   Stephanie Acre, MSW, [place] Social Worker

## 2019-04-04 NOTE — Tx Team (Signed)
Interdisciplinary Treatment and Diagnostic Plan Update  04/04/2019 Time of Session: 9:00am Andrew Rivas MRN: [id number]  Principal Diagnosis: Substance induced mood disorder ([place])  Secondary Diagnoses: Principal Problem:   Substance induced mood disorder ([place]) Active Problems:   Alcohol dependence ([place])   Current Medications:  Current Facility-Administered Medications  Medication Dose Route Frequency Provider Last Rate Last Dose  . acamprosate (CAMPRAL) tablet 666 mg  666 mg Oral TID WC Cobos, Myer Peer, MD   666 mg at 04/04/19 0807  . acetaminophen (TYLENOL) tablet 650 mg  650 mg Oral Q6H PRN Emmaline Kluver, FNP   650 mg at 04/02/19 1209  . alum & mag hydroxide-simeth (MAALOX/MYLANTA) 200-200-20 MG/5ML suspension 30 mL  30 mL Oral Q4H PRN Emmaline Kluver, FNP      . feeding supplement (ENSURE ENLIVE) (ENSURE ENLIVE) liquid 237 mL  237 mL Oral BID BM Nwoko, Agnes I, NP   237 mL at 03/30/19 1637  . FLUoxetine (PROZAC) capsule 20 mg  20 mg Oral Daily Emmaline Kluver, FNP   20 mg at 04/04/19 3382  . gabapentin (NEURONTIN) capsule 300 mg  300 mg Oral TID Emmaline Kluver, FNP   300 mg at 04/04/19 5053  . magnesium hydroxide (MILK OF MAGNESIA) suspension 30 mL  30 mL Oral Daily PRN Emmaline Kluver, FNP      . mirtazapine (REMERON) tablet 7.5 mg  7.5 mg Oral QHS Cobos, Myer Peer, MD   7.5 mg at 04/03/19 2128  . multivitamin with minerals tablet 1 tablet  1 tablet Oral Daily Emmaline Kluver, FNP   1 tablet at 04/04/19 9767  . pantoprazole (PROTONIX) EC tablet 40 mg  40 mg Oral Daily Emmaline Kluver, FNP   40 mg at 04/04/19 3419  . thiamine (B-1) injection 100 mg  100 mg Intramuscular Once Emmaline Kluver, FNP      . thiamine (VITAMIN B-1) tablet 100 mg  100 mg Oral Daily Emmaline Kluver, FNP   100 mg at 04/04/19 3790   PTA Medications: Medications Prior to Admission  Medication Sig Dispense Refill Last Dose  . FLUoxetine (PROZAC) 20 MG capsule Take 1 capsule (20 mg total) by mouth daily. 90 capsule 1   .  gabapentin (NEURONTIN) 300 MG capsule Take 1 capsule (300 mg total) by mouth 3 (three) times daily. 90 capsule 2   . hydrOXYzine (VISTARIL) 50 MG capsule Take 1 capsule (50 mg total) by mouth 3 (three) times daily as needed. 30 capsule 2   . naltrexone (DEPADE) 50 MG tablet Take 1 tablet (50 mg total) by mouth daily. 90 tablet 1   . omeprazole (PRILOSEC) 40 MG capsule Take 1 capsule (40 mg total) by mouth daily. 90 capsule 3   . polyvinyl alcohol (LIQUIFILM TEARS) 1.4 % ophthalmic solution Place 1 drop into both eyes as needed for dry eyes.       Patient Stressors: Financial difficulties Occupational concerns  Patient Strengths: Ability for insight Capable of independent living  Treatment Modalities: Medication Management, Group therapy, Case management,  1 to 1 session with clinician, Psychoeducation, Recreational therapy.   Physician Treatment Plan for Primary Diagnosis: Substance induced mood disorder ([place]) Long Term Goal(s): Improvement in symptoms so as ready for discharge Improvement in symptoms so as ready for discharge   Short Term Goals: Ability to identify changes in lifestyle to reduce recurrence of condition will improve Ability to verbalize feelings will improve Ability to disclose and discuss suicidal ideas Ability to  demonstrate self-control will improve Ability to identify and develop effective coping behaviors will improve Ability to maintain clinical measurements within normal limits will improve Compliance with prescribed medications will improve Ability to identify triggers associated with substance abuse/mental health issues will improve Ability to identify changes in lifestyle to reduce recurrence of condition will improve Ability to verbalize feelings will improve Ability to disclose and discuss suicidal ideas Ability to demonstrate self-control will improve Ability to identify and develop effective coping behaviors will improve Ability to maintain clinical  measurements within normal limits will improve Compliance with prescribed medications will improve Ability to identify triggers associated with substance abuse/mental health issues will improve  Medication Management: Evaluate patient's response, side effects, and tolerance of medication regimen.  Therapeutic Interventions: 1 to 1 sessions, Unit Group sessions and Medication administration.  Evaluation of Outcomes: Adequate for Discharge  Physician Treatment Plan for Secondary Diagnosis: Principal Problem:   Substance induced mood disorder ([facility]) Active Problems:   Alcohol dependence ([facility])  Long Term Goal(s): Improvement in symptoms so as ready for discharge Improvement in symptoms so as ready for discharge   Short Term Goals: Ability to identify changes in lifestyle to reduce recurrence of condition will improve Ability to verbalize feelings will improve Ability to disclose and discuss suicidal ideas Ability to demonstrate self-control will improve Ability to identify and develop effective coping behaviors will improve Ability to maintain clinical measurements within normal limits will improve Compliance with prescribed medications will improve Ability to identify triggers associated with substance abuse/mental health issues will improve Ability to identify changes in lifestyle to reduce recurrence of condition will improve Ability to verbalize feelings will improve Ability to disclose and discuss suicidal ideas Ability to demonstrate self-control will improve Ability to identify and develop effective coping behaviors will improve Ability to maintain clinical measurements within normal limits will improve Compliance with prescribed medications will improve Ability to identify triggers associated with substance abuse/mental health issues will improve     Medication Management: Evaluate patient's response, side effects, and tolerance of medication regimen.  Therapeutic  Interventions: 1 to 1 sessions, Unit Group sessions and Medication administration.  Evaluation of Outcomes: Adequate for Discharge   RN Treatment Plan for Primary Diagnosis: Substance induced mood disorder ([facility]) Long Term Goal(s): Knowledge of disease and therapeutic regimen to maintain health will improve  Short Term Goals: Ability to verbalize feelings will improve, Ability to identify and develop effective coping behaviors will improve and Compliance with prescribed medications will improve  Medication Management: RN will administer medications as ordered by provider, will assess and evaluate patient's response and provide education to patient for prescribed medication. RN will report any adverse and/or side effects to prescribing provider.  Therapeutic Interventions: 1 on 1 counseling sessions, Psychoeducation, Medication administration, Evaluate responses to treatment, Monitor vital signs and CBGs as ordered, Perform/monitor CIWA, COWS, AIMS and Fall Risk screenings as ordered, Perform wound care treatments as ordered.  Evaluation of Outcomes: Adequate for Discharge   LCSW Treatment Plan for Primary Diagnosis: Substance induced mood disorder ([facility]) Long Term Goal(s): Safe transition to appropriate next level of care at discharge, Engage patient in therapeutic group addressing interpersonal concerns.  Short Term Goals: Engage patient in aftercare planning with referrals and resources, Increase social support, Increase emotional regulation, Identify triggers associated with mental health/substance abuse issues and Increase skills for wellness and recovery  Therapeutic Interventions: Assess for all discharge needs, 1 to 1 time with Social worker, Explore available resources and support systems, Assess for adequacy in community  support network, Educate family and significant other(s) on suicide prevention, Complete Psychosocial Assessment, Interpersonal group therapy.  Evaluation of Outcomes:  Adequate for Discharge   Progress in Treatment: Attending groups: Yes. Participating in groups: Yes. Taking medication as prescribed: Yes. Toleration medication: Yes. Family/Significant other contact made: No, will contact:  supports if consents are granted. Patient understands diagnosis: Yes. Discussing patient identified problems/goals with staff: Yes. Medical problems stabilized or resolved: No. Denies suicidal/homicidal ideation: No. Issues/concerns per patient self-inventory: Yes.  New problem(s) identified: Yes, Describe:  financial stressors  New Short Term/Long Term Goal(s): detox, medication management for mood stabilization; elimination of SI thoughts; development of comprehensive mental wellness/sobriety plan.  Patient Goals: "Get back on my meds and level out."  Discharge Plan or Barriers:  Patient plans to return home with his roommates while he awaits an open bed for substance use treatment. [facility]   Reason for Continuation of Hospitalization: Medication stabilization   Estimated Length of Stay: 3-5 days Attendees: Patient: Andrew Rivas 04/04/2019 10:04 AM  Physician: Marguerita Merlesr.Clary; Dr. Nehemiah MassedFernando Cobos, MD 04/04/2019 10:04 AM  Nursing: Meriam SpragueBeverly, RN; Jan.W, RN 04/04/2019 10:04 AM  RN Care Manager: 04/04/2019 10:04 AM  Social Worker: Enid Cutterharlotte Hoy, LCSWA; WindsorJolan Yamel Bale, LCSW 04/04/2019 10:04 AM  Recreational Therapist:  04/04/2019 10:04 AM  Other: Marciano SequinJanet Sykes, NP 04/04/2019 10:04 AM  Other:  04/04/2019 10:04 AM  Other: 04/04/2019 10:04 AM    Scribe for Treatment Team: Maeola SarahJolan E Towanda Hornstein, LCSWA 04/04/2019 10:04 AM

## 2019-04-05 MED ORDER — GABAPENTIN 300 MG PO CAPS: 300.0000 mg | ORAL_CAPSULE | Freq: Three times a day (TID) | ORAL | 0 refills | Status: DC

## 2019-04-05 MED ORDER — HYDROXYZINE HCL 25 MG PO TABS: 25.0000 mg | ORAL_TABLET | Freq: Three times a day (TID) | ORAL | 0 refills | Status: DC | PRN

## 2019-04-05 MED ORDER — ACAMPROSATE CALCIUM 333 MG PO TBEC: 666.0000 mg | DELAYED_RELEASE_TABLET | Freq: Three times a day (TID) | ORAL | 0 refills | Status: DC

## 2019-04-05 MED ORDER — PANTOPRAZOLE SODIUM 40 MG PO TBEC: 40.0000 mg | DELAYED_RELEASE_TABLET | Freq: Every day | ORAL | 0 refills | Status: DC

## 2019-04-05 MED ORDER — FLUOXETINE HCL 20 MG PO CAPS: 20.0000 mg | ORAL_CAPSULE | Freq: Every day | ORAL | 0 refills | Status: DC

## 2019-04-05 MED ORDER — MIRTAZAPINE 7.5 MG PO TABS: 7.5000 mg | ORAL_TABLET | Freq: Every day | ORAL | 0 refills | Status: DC

## 2019-04-05 NOTE — Progress Notes (Signed)
[place] NOVEL CORONAVIRUS (COVID-19) DAILY CHECK-OFF SYMPTOMS - answer yes or no to each - every day NO YES  Have you had a fever in the past 24 hours?  . Fever (Temp > 37.80C / 100F) X   Have you had any of these symptoms in the past 24 hours? . New Cough .  Sore Throat  .  Shortness of Breath .  Difficulty Breathing .  Unexplained Body Aches   X   Have you had any one of these symptoms in the past 24 hours not related to allergies?   . Runny Nose .  Nasal Congestion .  Sneezing   X   If you have had runny nose, nasal congestion, sneezing in the past 24 hours, has it worsened?  X   EXPOSURES - check yes or no X   Have you traveled outside the state in the past 14 days?  X   Have you been in contact with someone with a confirmed diagnosis of COVID-19 or PUI in the past 14 days without wearing appropriate PPE?  X   Have you been living in the same home as a person with confirmed diagnosis of COVID-19 or a PUI (household contact)?    X   Have you been diagnosed with COVID-19?    X              What to do next: Answered NO to all: Answered YES to anything:   Proceed with unit schedule Follow the [facility] Inpatient Flowsheet.   

## 2019-04-05 NOTE — [facility] Group Notes (Signed)
LCSW Group Therapy Note  Date/Time:  04/05/2019   10:00AM-11:00AM  Type of Therapy and Topic:  Group Therapy:  Fears and Unhealthy/Healthy Coping Skills  Participation Level:  Active   Description of Group:  The focus of this group was to discuss some of the prevalent fears that patients experience, and to identify the commonalities among group members.  A fun exercise was used to initiate the discussion, followed by writing on the white board a group-generated list of unhealthy coping and healthy coping techniques to deal with each fear.    Therapeutic Goals: 1. Patient will be able to distinguish between healthy and unhealthy coping skills 2. Patient will identify and describe 3 fears they experience 3. Patient will identify one positive coping strategy for each fear they experience 4. Patient will respond empathetically to peers' statements regarding fears they experience  Summary of Patient Progress:  The patient expressed little during group except when called on directly.  He did say that he prefers to listen and take in information from other participants.  He was friendly and amenable to be used to describe examples to the group.  Therapeutic Modalities Cognitive Behavioral Therapy Motivational Interviewing  Selmer Dominion, LCSW

## 2019-04-05 NOTE — Progress Notes (Signed)
Pt discharged to lobby. Pt was stable and appreciative at that time. All papers, samples, and prescriptions were given and valuables returned. Verbal understanding expressed. Denies SI/HI and A/VH. Pt given opportunity to express concerns and ask questions. 

## 2019-04-05 NOTE — Discharge Summary (Addendum)
Physician Discharge Summary Note  Patient:  Andrew Rivas is an 58 y.o., male MRN:  [id number] DOB:  [birth year] Patient phone:  [phone] (home)  Patient address:   [address] [place] [address],  Total Time spent with patient: 15 minutes  Date of Admission:  03/30/2019 Date of Discharge: 04/05/2019  Reason for Admission: Per admission assessment note: Patient is a 58 year old male with a longstanding history significant for alcohol dependence, substance-induced mood disorder, depression unspecified, anxiety unspecified who originally presented to the [facility] emergency department on 03/28/2019 with suicidal ideation. He apparently had been seen by bystanders in a shopping center lying on the ground unresponsive. EMS brought the patient to the emergency department. He stated he was suicidal at that time and plan to overdose on medications. The patient stated that he had run out of his medicine several weeks ago, had and had not followed up with his discharge plan after he had been discharged in September of this year. His blood alcohol on admission was 378. In the emergency department he also endorsed auditory hallucinations. His last admission to our facility was on 01/22/2019. At that time the primary diagnosis was alcohol dependence, benzodiazepine dependence, opiate use disorder, substance-induced mood disorder. His blood alcohol on admission at that time was 401. He was discharged on fluoxetine, gabapentin, hydroxyzine, naltrexone and omeprazole. He stated he had forgotten about his appointment at [facility] that had been scheduled at discharge. He admitted he has intoxication charges pending, but the court date is not until January. He he stated he is attempting to get into a rehabilitation facility. Review of the electronic medical record revealed at least 10 emergency room visits and 2 hospitalizations in 12 months. As stated above, his blood alcohol was  378 but his drug screen was negative  Principal Problem: Substance induced mood disorder [facility]) Discharge Diagnoses: Principal Problem:   Substance induced mood disorder ([place]) Active Problems:   Alcohol dependence ([place])   Past Psychiatric History:   Past Medical History:  Past Medical History:  Diagnosis Date  . Alcohol abuse   . Medical history non-contributory     Past Surgical History:  Procedure Laterality Date  . HAND SURGERY Left   . NO PAST SURGERIES     Family History: History reviewed. No pertinent family history. Family Psychiatric  History:  Social History:  Social History   Substance and Sexual Activity  Alcohol Use Yes   Comment: daily      Social History   Substance and Sexual Activity  Drug Use Yes  . Types: Marijuana, Oxycodone    Social History   Socioeconomic History  . Marital status: Single    Spouse name: Not on file  . Number of children: Not on file  . Years of education: Not on file  . Highest education level: Not on file  Occupational History  . Not on file  Social Needs  . Financial resource strain: Not on file  . Food insecurity    Worry: Not on file    Inability: Not on file  . Transportation needs    Medical: Not on file    Non-medical: Not on file  Tobacco Use  . Smoking status: Current Every Day Smoker    Packs/day: 1.00  . Smokeless tobacco: Never Used  Substance and Sexual Activity  . Alcohol use: Yes    Comment: daily   . Drug use: Yes    Types: Marijuana, Oxycodone  . Sexual activity: Not Currently  Birth control/protection: None  Lifestyle  . Physical activity    Days per week: Not on file    Minutes per session: Not on file  . Stress: Not on file  Relationships  . Social Musician on phone: Not on file    Gets together: Not on file    Attends religious service: Not on file    Active member of club or organization: Not on file    Attends meetings of clubs or organizations: Not on file     Relationship status: Not on file  Other Topics Concern  . Not on file  Social History Narrative   ** Merged History [facility] Course: Andrew Rivas was admitted for Substance induced mood disorder (HCC) and crisis management.  Pt was treated discharged with the medications listed below under Medication List.  Medical problems were identified and treated as needed.  Home medications were restarted as appropriate.  Improvement was monitored by observation and Andrew Rivas 's daily report of symptom reduction.  Emotional and mental status was monitored by daily self-inventory reports completed by Andrew Rivas and clinical staff.         Andrew Rivas was evaluated by the treatment team for stability and plans for continued recovery upon discharge. Andrew Rivas 's motivation was an integral factor for scheduling further treatment. Employment, transportation, bed availability, health status, family support, and any pending legal issues were also considered during hospital stay. Pt was offered further treatment options upon discharge including but not limited to Residential, Intensive Outpatient, and Outpatient treatment.  Andrew Rivas will follow up with the services as listed below under Follow Up Information.     Upon completion of this admission the patient was both mentally and medically stable for discharge denying suicidal/homicidal ideations.     Andrew Rivas responded well to treatment with Prozac 20 mg, Campral 666 mg and Gabapentin 300 mg without adverse effects. Pt demonstrated improvement without reported or observed adverse effects to the point of stability appropriate for outpatient management. Pertinent labs include:for which outpatient follow-up is necessary for lab recheck as mentioned CMP and CBC  below. Reviewed CBC, CMP, BAL, and UDS- neg ; all unremarkable aside from noted exceptions.   Physical Findings: AIMS: Facial and Oral  Movements Muscles of Facial Expression: None, normal Lips and Perioral Area: None, normal Jaw: None, normal Tongue: None, normal,Extremity Movements Upper (arms, wrists, hands, fingers): None, normal Lower (legs, knees, ankles, toes): None, normal, Trunk Movements Neck, shoulders, hips: None, normal, Overall Severity Severity of abnormal movements (highest score from questions above): None, normal Incapacitation due to abnormal movements: None, normal Patient's awareness of abnormal movements (rate only patient's report): No Awareness, Dental Status Current problems with teeth and/or dentures?: No Does patient usually wear dentures?: No  CIWA:  CIWA-Ar Total: 0 COWS:  COWS Total Score: 1  Musculoskeletal: Strength & Muscle Tone: within normal limits Gait & Station: normal Patient leans: N/A  Psychiatric Specialty Exam: See SRA by MD  Physical Exam  Constitutional: He appears well-developed.  Skin: Skin is warm and dry.    Review of Systems  Psychiatric/Behavioral: Positive for depression. The patient is nervous/anxious.   All other systems reviewed and are negative.   Blood pressure 97/71, pulse 97, temperature 99.2 F (37.3 C), temperature source Oral, resp. rate 18, height  (1.854 m), weight 67.6 kg, SpO2 95 %.Body mass index is 19.66  kg/m.   Have you used any form of tobacco in the last 30 days? (Cigarettes, Smokeless Tobacco, Cigars, and/or Pipes): Yes  Has this patient used any form of tobacco in the last 30 days? (Cigarettes, Smokeless Tobacco, Cigars, and/or Pipes) Yes, A prescription for an FDA-approved tobacco cessation medication was offered at discharge and the patient refused  Blood Alcohol level:  Lab Results  Component Value Date   ETH 378 (HH) 03/28/2019   ETH 287 (H) 01/29/2019    Metabolic Disorder Labs:  Lab Results  Component Value Date   HGBA1C 5.6 01/23/2019   MPG 114.02 01/23/2019   No results found for: PROLACTIN Lab Results  Component  Value Date   CHOL 205 (H) 01/23/2019   TRIG 40 01/23/2019   HDL 119 01/23/2019   CHOLHDL 1.7 01/23/2019   VLDL 8 01/23/2019   LDLCALC 78 01/23/2019    See Psychiatric Specialty Exam and Suicide Risk Assessment completed by Attending Physician prior to discharge.  Discharge destination:  Home  Is patient on multiple antipsychotic therapies at discharge:  No   Has Patient had three or more failed trials of antipsychotic monotherapy by history:  No  Recommended Plan for Multiple Antipsychotic Therapies: NA  Discharge Instructions    Discharge instructions   Complete by: As directed    Patient is instructed to take all prescribed medications as recommended. Report any side effects or adverse reactions to your outpatient psychiatrist. Patient is instructed to abstain from alcohol and illegal drugs while on prescription medications. In the event of worsening symptoms, patient is instructed to call the crisis hotline, 911, or go to the nearest emergency department for evaluation and treatment.     Allergies as of 04/05/2019   No Known Allergies     Medication List    STOP taking these medications   hydrOXYzine 50 MG capsule Commonly known as: Vistaril   naltrexone 50 MG tablet Commonly known as: DEPADE   omeprazole 40 MG capsule Commonly known as: PriLOSEC Replaced by: pantoprazole 40 MG tablet   polyvinyl alcohol 1.4 % ophthalmic solution Commonly known as: LIQUIFILM TEARS     TAKE these medications     Indication  acamprosate 333 MG tablet Commonly known as: CAMPRAL Take 2 tablets (666 mg total) by mouth 3 (three) times daily with meals.  Indication: Abuse or Misuse of Alcohol   FLUoxetine 20 MG capsule Commonly known as: PROZAC Take 1 capsule (20 mg total) by mouth daily.  Indication: Depression   gabapentin 300 MG capsule Commonly known as: NEURONTIN Take 1 capsule (300 mg total) by mouth 3 (three) times daily.  Indication: Abuse or Misuse of Alcohol    hydrOXYzine 25 MG tablet Commonly known as: ATARAX/VISTARIL Take 1 tablet (25 mg total) by mouth 3 (three) times daily as needed for anxiety.  Indication: Feeling Anxious   mirtazapine 7.5 MG tablet Commonly known as: REMERON Take 1 tablet (7.5 mg total) by mouth at bedtime.  Indication: Major Depressive Disorder   pantoprazole 40 MG tablet Commonly known as: PROTONIX Take 1 tablet (40 mg total) by mouth daily. Start taking on: April 06, 2019 Replaces: omeprazole 40 MG capsule  Indication: Gastroesophageal Reflux Disease      Follow-up Information    [facility] Follow up.   Specialty: Addiction Medicine Why: Referral made. Currently there are no beds available for residential treatment services. Please be sure to call DAILY to inquire about open beds and treatment.  Contact information: [address]  [place] [address] [phone]        [facility] Follow up.   Why: Referral made. Currently there are no beds available for residential treatment services. Please be sure to call DAILY to inquire about open beds and treatment.  Contact information: [address] [place] [address] [phone]        [facility] Follow up.   Why: Referral made. Currently there are no beds available for residential treatment services. Please be sure to call DAILY to inquire about open beds and treatment.  Contact information: [address] [place] [address] [phone]        [facility].   Specialty: Behavioral Health Why: Walk-in hours for substance use therapy/services is Monday, Wednesday & Friday from 8:00am-12:00pm and 1:00pm-3:00pm. Please be sure to call and establish services if interested at discharge.  Contact information: [address] [place] [address] [phone]        [facility]. Call on 04/07/2019.   Why:  Appointment for medication management services is Monday, 04/07/19 at 1:00pm. This appointment will be on the telephone. Please expect a phone call from agency on day of appointment. Be sure to provide any discharge information given.  Contact information: [address] [place] [address] [phone]           Follow-up recommendations:  Activity:  as tolerated Diet:  heart healthy  Comments:  Take all medications as prescribed. Keep all follow-up appointments as scheduled.  Do not consume alcohol or use illegal drugs while on prescription medications. Report any adverse effects from your medications to your primary care provider promptly.  In the event of recurrent symptoms or worsening symptoms, call 911, a crisis hotline, or go to the nearest emergency department for evaluation.   Signed: Oneta Rack, NP 04/05/2019, 10:25 AM   Patient seen, Suicide Assessment Completed.  Disposition Plan Reviewed

## 2019-04-05 NOTE — BHH Suicide Risk Assessment (Signed)
[facility] Discharge Suicide Risk Assessment   Principal Problem: Substance induced mood disorder ([place]) Discharge Diagnoses: Principal Problem:   Substance induced mood disorder ([place]) Active Problems:   Alcohol dependence ([place])   Total Time spent with patient: 30 minutes  Musculoskeletal: Strength & Muscle Tone: within normal limits Gait & Station: normal Patient leans: N/A  Psychiatric Specialty Exam: ROS denies chest pain or shortness of breath, no vomiting  Blood pressure 102/72, pulse 75, temperature 99.2 F (37.3 C), temperature source Oral, resp. rate 18, height 6\' 1"  (1.854 m), weight 67.6 kg, SpO2 95 %.Body mass index is 19.66 kg/m.  General Appearance: Improved grooming  Eye Contact::  Good  Speech:  Normal Rate409  Volume:  Normal  Mood:  Improving mood, states he feels significantly better than on admission  Affect:  More reactive, less anxious  Thought Process:  Linear and Descriptions of Associations: Intact  Orientation:  Full (Time, Place, and Person)  Thought Content:  No hallucinations, no delusions  Suicidal Thoughts:  No denies suicidal or self-injurious ideations, denies homicidal or violent ideations  Homicidal Thoughts:  No  Memory:  Recent and remote grossly intact  Judgement:  Other:  Improving  Insight:  Fair/improving  Psychomotor Activity:  Normal  Concentration:  Good  Recall:  Good  Fund of Knowledge:Good  Language: Good  Akathisia:  Negative  Handed:  Right  AIMS (if indicated):     Assets:  Communication Skills Desire for Improvement Resilience  Sleep:  Number of Hours: 6.25  Cognition: WNL  ADL's:  Intact   Mental Status Per Nursing Assessment::   On Admission:  Suicidal ideation indicated by patient, Self-harm behaviors  Demographic Factors:  58 year old male  Loss Factors: Alcohol abuse, limited support network  Historical Factors: History of depression, anxiety, alcohol use disorder  Risk Reduction Factors:   Living with  another person, especially a relative and Positive coping skills or problem solving skills  Continued Clinical Symptoms:  Today patient presents alert, attentive, polite on approach, no psychomotor agitation or restlessness, no current symptoms of withdrawal, vitals are stable (BP 102/72, pulse 75).  Mood improved.  Acknowledges feeling better than on admission.  Affect more reactive and less anxious today.  No thought disorder.  Denies SI or self-injurious ideations, denies HI or violent ideations. Denies medication side effects, currently on Campral, Prozac, Remeron. Side effects reviewed Behavior on unit in good control, interactive with peers, polite on approach  Cognitive Features That Contribute To Risk:  No gross cognitive deficits noted upon discharge. Is alert , attentive, and oriented x 3   Suicide Risk:  Mild:  Suicidal ideation of limited frequency, intensity, duration, and specificity.  There are no identifiable plans, no associated intent, mild dysphoria and related symptoms, good self-control (both objective and subjective assessment), few other risk factors, and identifiable protective factors, including available and accessible social support.  Follow-up [place] Follow up.   Specialty: Addiction Medicine Why: Referral made. Currently there are no beds available for residential treatment services. Please be sure to call DAILY to inquire about open beds and treatment.  Contact information: [place] [address] [phone]        [facility] Follow up.   Why: Referral made. Currently there are no beds available for residential treatment services. Please be sure to call DAILY to inquire about open beds and treatment.  Contact information: [address] [place] [address] [phone]  [facility] Follow up.   Why: Referral made.  Currently there are no beds available for residential treatment services. Please be sure to call DAILY to inquire about open beds and treatment.  Contact information: [address] [place] [address] [phone]        [facility].   Specialty: Behavioral Health Why: Walk-in hours for substance use therapy/services is Monday, Wednesday & Friday from 8:00am-12:00pm and 1:00pm-3:00pm. Please be sure to call and establish services if interested at discharge.  Contact information: [address] [place] [address] [phone]        [facility]er. Call on 04/07/2019.   Why: Appointment for medication management services is Monday, 04/07/19 at 1:00pm. This appointment will be on the telephone. Please expect a phone call from agency on day of appointment. Be sure to provide any discharge information given.  Contact information: [address] [place] [address] [phone]           Plan Of Care/Follow-up recommendations:  Activity:  As tolerated Diet:  Regular Tests:  NA Other:  See below  Patient expressing readiness for discharge, reports he plans to return home after he found out that his roommate will allow him to return.  Plans to follow-up as above.  Craige Cotta, MD 04/05/2019, 1:26 PM

## 2019-04-05 NOTE — Progress Notes (Addendum)
D. Pt is calm and cooperative, friendly upon intital approach- visible in the milieu interacting appropriately with peers Pt currently denies SI/HI and AVH  A. Labs and vitals monitored. Pt compliant with medications. Pt supported emotionally and encouraged to express concerns and ask questions.   R. Pt remains safe with 15 minute checks. Will continue POC.

## 2019-04-05 NOTE — Progress Notes (Signed)
   04/05/19 0000  Psych Admission Type (Psych Patients Only)  Admission Status Voluntary  Psychosocial Assessment  Patient Complaints Substance abuse  Eye Contact Fair  Facial Expression Flat  Affect Appropriate to circumstance  Speech Logical/coherent  Interaction Assertive  Motor Activity Other (Comment) (WDL)  Appearance/Hygiene Unremarkable  Behavior Characteristics Cooperative  Mood Anxious  Thought Process  Coherency WDL  Content WDL  Delusions None reported or observed  Perception WDL  Hallucination None reported or observed  Judgment Poor  Confusion None  Danger to Self  Current suicidal ideation? Passive  Self-Injurious Behavior Some self-injurious ideation observed or expressed.  No lethal plan expressed   Agreement Not to Harm Self Yes  Description of Agreement pt verbally contracts for safety  Danger to Others  Danger to Others None reported or observed   Pt stated he was anxious through the day. Pt visible on the uni this evening

## 2019-04-05 NOTE — Progress Notes (Signed)
  [facility] Adult Case Management Discharge Plan :  Will you be returning to the same living situation after discharge:  Yes,  will temporarily go home with roommates while he awaits and calls daily for a bed at rehab At discharge, do you have transportation home?: Yes,  bus Do you have the ability to pay for your medications: No.  No income, no insurance, referred to a local agency for follow-up and help in obtaining meds  Release of information consent forms completed and emailed to Medical Records, then turned in.  Patient to Follow up at: Follow-up [place] Follow up.   Specialty: Addiction Medicine Why: Referral made. Currently there are no beds available for residential treatment services. Please be sure to call DAILY to inquire about open beds and treatment.  Contact information: [place] [address] [phone]        [facility] Follow up.   Why: Referral made. Currently there are no beds available for residential treatment services. Please be sure to call DAILY to inquire about open beds and treatment.  Contact information: [address] [place] [address] [phone]        [facility] Follow up.   Why: Referral made. Currently there are no beds available for residential treatment services. Please be sure to call DAILY to inquire about open beds and treatment.  Contact information: [place] [address] [phone]        [facility].   Specialty: Behavioral Health Why: Walk-in hours for substance use therapy/services is Monday, Wednesday & Friday from 8:00am-12:00pm and 1:00pm-3:00pm. Please be sure to call and establish services if interested at discharge.  Contact information: [address] [place] [address] [phone]        [facility]. Call on 04/07/2019.   Why: Appointment  for medication management services is Monday, 04/07/19 at 1:00pm. This appointment will be on the telephone. Please expect a phone call from agency on day of appointment. Be sure to provide any discharge information given.  Contact information: [address] [place] [address] [phone]           Next level of care provider has access to [place] and Suicide Prevention discussed: No.  Declined by patient  Have you used any form of tobacco in the last 30 days? (Cigarettes, Smokeless Tobacco, Cigars, and/or Pipes): Yes  Has patient been referred to the Quitline?: Patient refused referral  Patient has been referred for addiction treatment: Yes  Maretta Los, LCSW 04/05/2019, 9:51 AM

## 2019-04-11 ENCOUNTER — Encounter (HOSPITAL_COMMUNITY): Payer: Self-pay

## 2019-04-11 ENCOUNTER — Emergency Department (HOSPITAL_COMMUNITY)
Admission: EM | Admit: 2019-04-11 | Discharge: 2019-04-12 | Disposition: A | Discharge status: Home / Self Care | Payer: Self-pay | Attending: Emergency Medicine | Admitting: Emergency Medicine

## 2019-04-11 ENCOUNTER — Other Ambulatory Visit: Payer: Self-pay

## 2019-04-11 DIAGNOSIS — R45851 Suicidal ideations: Secondary | ICD-10-CM | POA: Insufficient documentation

## 2019-04-11 DIAGNOSIS — F10929 Alcohol use, unspecified with intoxication, unspecified: Secondary | ICD-10-CM

## 2019-04-11 DIAGNOSIS — F1014 Alcohol abuse with alcohol-induced mood disorder: Secondary | ICD-10-CM | POA: Insufficient documentation

## 2019-04-11 DIAGNOSIS — F17200 Nicotine dependence, unspecified, uncomplicated: Secondary | ICD-10-CM | POA: Insufficient documentation

## 2019-04-11 DIAGNOSIS — F1010 Alcohol abuse, uncomplicated: Secondary | ICD-10-CM | POA: Diagnosis present

## 2019-04-11 LAB — COMPREHENSIVE METABOLIC PANEL
ALT: 26 U/L (ref 0–44)
AST: 24 U/L (ref 15–41)
Albumin: 4.6 g/dL (ref 3.5–5.0)
Alkaline Phosphatase: 68 U/L (ref 38–126)
Anion gap: 12 (ref 5–15)
BUN: 9 mg/dL (ref 6–20)
CO2: 23 mmol/L (ref 22–32)
Calcium: 8.9 mg/dL (ref 8.9–10.3)
Chloride: 107 mmol/L (ref 98–111)
Creatinine, Ser: 0.65 mg/dL (ref 0.61–1.24)
GFR calc Af Amer: >60 mL/min (ref >60)
GFR calc non Af Amer: >60 mL/min (ref >60)
Glucose, Bld: 89 mg/dL (ref 70–99)
Potassium: 3.8 mmol/L (ref 3.5–5.1)
Sodium: 142 mmol/L (ref 135–145)
Total Bilirubin: 0.7 mg/dL (ref 0.3–1.2)
Total Protein: 8.1 g/dL (ref 6.5–8.1)

## 2019-04-11 LAB — CBC
HCT: 46.5 % (ref 39.0–52.0)
Hemoglobin: 15.8 g/dL (ref 13.0–17.0)
MCH: 31.2 pg (ref 26.0–34.0)
MCHC: 34 g/dL (ref 30.0–36.0)
MCV: 91.9 fL (ref 80.0–100.0)
Platelets: 294 10*3/uL (ref 150–400)
RBC: 5.06 MIL/uL (ref 4.22–5.81)
RDW: 13.7 % (ref 11.5–15.5)
WBC: 8.1 10*3/uL (ref 4.0–10.5)
nRBC: 0 % (ref 0.0–0.2)

## 2019-04-11 LAB — SALICYLATE LEVEL: Salicylate Lvl: <7 mg/dL (ref 2.8–30.0)

## 2019-04-11 LAB — ACETAMINOPHEN LEVEL: Acetaminophen (Tylenol), Serum: <10 ug/mL — ABNORMAL LOW (ref 10–30)

## 2019-04-11 LAB — ETHANOL: Alcohol, Ethyl (B): 321 mg/dL (ref <10)

## 2019-04-11 NOTE — ED Triage Notes (Signed)
Patient arriving by EMS from [facility] with complaints of alcohol intoxication and SI. Patient states that he was drinking and took 4 oxycodone to kill himself. Unknown amount of alcohol ingested. Denies HI. Patient is alert but drowsy at this time.
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
## 2019-04-11 NOTE — ED Notes (Signed)
Date and time results received: 04/11/19 2151 (use smartphrase ".now" to insert current time)  Test: Etoh Critical Value: 326  Name of Provider Notified: Long Md  Orders Received? Or Actions Taken?: no new orders

## 2019-04-12 LAB — RAPID URINE DRUG SCREEN, HOSP PERFORMED
Amphetamines: NOT DETECTED
Barbiturates: NOT DETECTED
Benzodiazepines: NOT DETECTED
Cocaine: NOT DETECTED
Opiates: NOT DETECTED
Tetrahydrocannabinol: NOT DETECTED

## 2019-04-12 MED ORDER — HYDROXYZINE HCL 25 MG PO TABS: 25.0000 mg | ORAL_TABLET | Freq: Three times a day (TID) | ORAL | Status: DC | PRN

## 2019-04-12 MED ORDER — GABAPENTIN 300 MG PO CAPS: 300.0000 mg | ORAL_CAPSULE | Freq: Three times a day (TID) | ORAL | Status: DC

## 2019-04-12 MED ORDER — FLUOXETINE HCL 20 MG PO CAPS: 20.0000 mg | ORAL_CAPSULE | Freq: Every day | ORAL | Status: DC

## 2019-04-12 NOTE — BH Assessment (Signed)
Received TTS consult request. Spoke to RN who said Pt is currently intoxicated and too somnolent to participate in assessment at this time. Pt will be assessed when he is alert.   Evelena Peat, [facility], [facility] Triage Specialist [phone]

## 2019-04-12 NOTE — Patient Outreach (Signed)
CPSS met with the patient in order to provide substance use recovery support and provide information for substance use recovery resources. Patient reports a history of alcohol use. Patient recently was discharged [facility] on 04/05/19. Patient is interested in residential substance use treatment. Patient already has an admission assessment scheduled at [facility] in [place], [place] in a couple weeks. Patient plans to attend AA meetings for his substance use recovery maintenance until he can attend his [facility] assessment. CPSS provided the patient with information for several different recovery resources. Some of these resources include residential/outpatient substance use treatment center list, AA meeting list, and CPSS contact information. CPSS strongly encouraged the patient to stay in contact with CPSS for further help with [facility]PSS substance use recovery support services.

## 2019-04-12 NOTE — Consult Note (Signed)
[facility] Psych ED Discharge  04/12/2019 10:33 AM Andrew Rivas  MRN:  [id number] Principal Problem: Alcohol abuse Discharge Diagnoses: Principal Problem:   Alcohol abuse   Subjective:  Patient seen and evaluated by NP.  He is currently denying suicidal or homicidal ideation.  Denies auditory or visual hallucinations.  Patient reports  "I got a little drunk" patient lives with roommates denies access to weapons denies use of any substance aside from alcohol.  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 Patient denies history of seizures or blacking out with intact alcohol use. Patient states "I would like to go home and relax."  Patient reports home medications including including Prozac and gabapentin. Peers support consult ordered for patient.  Patient reports I would like help with my alcohol use.  Patient reports negative consequences with alcohol including "I spend all my money, I have hangovers, and I have lost relationships." Case discussed with Dr. Jola Babinski, who agrees with recommendation for discharge.  Total Time spent with patient: 30 minutes  Past Psychiatric History: Alcohol Use Disorder  Past Medical History:  Past Medical History:  Diagnosis Date  . Alcohol abuse   . Medical history non-contributory     Past Surgical History:  Procedure Laterality Date  . HAND SURGERY Left   . NO PAST SURGERIES     Family History: No family history on file. Family Psychiatric  History: Denies Social History:  Social History   Substance and Sexual Activity  Alcohol Use Yes   Comment: daily      Social History   Substance and Sexual Activity  Drug Use Yes  . Types: Marijuana, Oxycodone    Social History   Socioeconomic History  . Marital status: Single    Spouse name: Not on file  . Number of children: Not on file  . Years of education: Not on file  . Highest education level: Not on file  Occupational History  . Not on file  Social Needs  . Financial resource  strain: Not on file  . Food insecurity    Worry: Not on file    Inability: Not on file  . Transportation needs    Medical: Not on file    Non-medical: Not on file  Tobacco Use  . Smoking status: Current Every Day Smoker    Packs/day: 1.00  . Smokeless tobacco: Never Used  Substance and Sexual Activity  . Alcohol use: Yes    Comment: daily   . Drug use: Yes    Types: Marijuana, Oxycodone  . Sexual activity: Not Currently    Birth control/protection: None  Lifestyle  . Physical activity    Days per week: Not on file    Minutes per session: Not on file  . Stress: Not on file  Relationships  . Social Musician on phone: Not on file    Gets together: Not on file    Attends religious service: Not on file    Active member of club or organization: Not on file    Attends meetings of clubs or organizations: Not on file    Relationship status: Not on file  Other Topics Concern  . Not on file  Social History Narrative   ** Merged History Encounter **        Has this patient used any form of tobacco in the last 30 days? (Cigarettes, Smokeless Tobacco, Cigars, and/or Pipes) A prescription for an FDA-approved tobacco cessation medication was offered at discharge and the patient refused  Current Medications: Current Facility-Administered Medications  Medication Dose Route Frequency Provider Last Rate Last Dose  . [START ON 04/13/2019] FLUoxetine (PROZAC) capsule 20 mg  20 mg Oral Daily Long, Arlyss RepressJoshua G, MD      . Melene Muller[START ON 04/13/2019] gabapentin (NEURONTIN) capsule 300 mg  300 mg Oral TID Long, Arlyss RepressJoshua G, MD      . hydrOXYzine (ATARAX/VISTARIL) tablet 25 mg  25 mg Oral TID PRN Long, Arlyss RepressJoshua G, MD       Current Outpatient Medications  Medication Sig Dispense Refill  . acamprosate (CAMPRAL) 333 MG tablet Take 2 tablets (666 mg total) by mouth 3 (three) times daily with meals. 180 tablet 0  . FLUoxetine (PROZAC) 20 MG capsule Take 1 capsule (20 mg total) by mouth daily. 30  capsule 0  . gabapentin (NEURONTIN) 300 MG capsule Take 1 capsule (300 mg total) by mouth 3 (three) times daily. 90 capsule 0  . hydrOXYzine (ATARAX/VISTARIL) 25 MG tablet Take 1 tablet (25 mg total) by mouth 3 (three) times daily as needed for anxiety. 30 tablet 0  . mirtazapine (REMERON) 7.5 MG tablet Take 1 tablet (7.5 mg total) by mouth at bedtime. 30 tablet 0  . pantoprazole (PROTONIX) 40 MG tablet Take 1 tablet (40 mg total) by mouth daily. 30 tablet 0   PTA Medications: (Not in a hospital admission)   Musculoskeletal: Strength & Muscle Tone: within normal limits Gait & Station: normal Patient leans: N/A  Psychiatric Specialty Exam: Physical Exam  Nursing note and vitals reviewed. Constitutional: He is oriented to person, place, and time. He appears well-developed.  HENT:  Head: Normocephalic.  Cardiovascular: Normal rate.  Respiratory: Effort normal.  Neurological: He is alert and oriented to person, place, and time.  Psychiatric: He has a normal mood and affect. His behavior is normal. Judgment and thought content normal.    Review of Systems  Constitutional: Negative.   HENT: Negative.   Eyes: Negative.   Respiratory: Negative.   Cardiovascular: Negative.   Gastrointestinal: Negative.   Genitourinary: Negative.   Musculoskeletal: Negative.   Skin: Negative.   Neurological: Negative.   Endo/Heme/Allergies: Negative.   Psychiatric/Behavioral: Positive for substance abuse.    Blood pressure 101/73, pulse 85, temperature 97.9 F (36.6 C), temperature source Oral, resp. rate 16, SpO2 95 %.There is no height or weight on file to calculate BMI.  General Appearance: Casual  Eye Contact:  Good  Speech:  Clear and Coherent and Normal Rate  Volume:  Normal  Mood:  Euthymic  Affect:  Appropriate and Congruent  Thought Process:  Coherent, Goal Directed and Descriptions of Associations: Intact  Orientation:  Full (Time, Place, and Person)  Thought Content:  WDL and  Logical  Suicidal Thoughts:  No  Homicidal Thoughts:  No  Memory:  Immediate;   Good Recent;   Good Remote;   Good  Judgement:  Good  Insight:  Fair  Psychomotor Activity:  Normal  Concentration:  Concentration: Good and Attention Span: Good  Recall:  Good  Fund of Knowledge:  Good  Language:  Good  Akathisia:  No  Handed:  Right  AIMS (if indicated):     Assets:  Communication Skills Desire for Improvement Financial Resources/Insurance Housing Social Support  ADL's:  Intact  Cognition:  WNL  Sleep:        Demographic Factors:  Male  Loss Factors: NA  Historical Factors: NA  Risk Reduction Factors:   Employed, Living with another person, especially a relative, Positive social support, Positive  therapeutic relationship and Positive coping skills or problem solving skills  Continued Clinical Symptoms:  Alcohol/Substance Abuse/Dependencies  Cognitive Features That Contribute To Risk:  None    Suicide Risk:  Minimal: No identifiable suicidal ideation.  Patients presenting with no risk factors but with morbid ruminations; may be classified as minimal risk based on the severity of the depressive symptoms    Plan Of Care/Follow-up recommendations:  Other:  Plan to discharge home, follow up with outpatient psychiatry and substance use treatment options.   Disposition: Discharge home with outpatient resources.  Emmaline Kluver, FNP 04/12/2019, 10:33 AM

## 2019-04-12 NOTE — BH Assessment (Addendum)
Assessment Note  Andrew Rivas is an 58 y.o. male that presents after being found in the community actively impaired and was transported to [facility] for evaluation. Patient denies any S/I, H/I or AVH at the time of assessment. Patient has a history of bipolar disorder, alcohol abuse and abuse of narcotic pain medications. Patient reports using various amounts of alcohol daily with last use on 04/11/19 when patient states he consumed a unknown amount of alcohol prior to arrival.  He reports feeling depressed and acknowledges symptoms including social withdrawal, loss of interest in usual pleasures and decreased concentration. Patient was last seen on 03/29/19 when he presented to [facility] with similar symptoms. He reports he has attempted suicide twice in the past by overdose. He denies visual hallucinations. He denies homicidal ideation or history of violence. Per notes patient presented to the emergency department for evaluation of alcohol intoxication with suicidal statements. Patient was found [place] [employer] [place] and appeared to be intoxicated and reported EMS that he had taken four Roxicodone in an attempt to kill himself. Patient states has been feeling suicidal since yesterday. Andrew Pouch NP notes this date, "Patient seen and evaluated by NP. He is currently denying suicidal or homicidal ideation. Denies auditory or visual hallucinations. Patient reports  "I got a little drunk" patient lives with roommates denies access to weapons denies use of any substance aside from alcohol. 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 Patient denies history of seizures or blacking out with intact alcohol use.  Patient states "I would like to go home and relax." Patient reports home medications including Prozac and gabapentin. Peers support consult ordered for patient. Patient reports I would like help with my alcohol use. Patient reports negative consequences with alcohol including "I spend all my money, I have  hangovers, and I have lost relationships." Per Andrew Pouch NP case discussed with Dr. Jola Babinski, who agrees with recommendation for discharge.  Diagnosis: Substance induced mood disorder  Past Medical History:  Past Medical History:  Diagnosis Date  . Alcohol abuse   . Medical history non-contributory     Past Surgical History:  Procedure Laterality Date  . HAND SURGERY Left   . NO PAST SURGERIES      Family History: No family history on file.  Social History:  reports that he has been smoking. He has been smoking about 1.00 pack per day. He has never used smokeless tobacco. He reports current alcohol use. He reports current drug use. Drugs: Marijuana and Oxycodone.  Additional Social History:  Alcohol / Drug Use Pain Medications: See MAR Prescriptions: See MAR Over the Counter: See MAR History of alcohol / drug use?: Yes Longest period of sobriety (when/how long): Unknown Negative Consequences of Use: (Denies) Withdrawal Symptoms: (Denies) Substance #1 Name of Substance 1: Alcohol 1 - Age of First Use: 22 1 - Amount (size/oz): Varies 1 - Frequency: Varies 1 - Duration: Ongoing 1 - Last Use / Amount: 04/11/19 Unknown amount  CIWA: CIWA-Ar BP: 101/73 Pulse Rate: 85 COWS:    Allergies: No Known Allergies  Home Medications: (Not in a hospital admission)   OB/GYN Status:  No LMP for male patient.  General Assessment Data Location of Assessment: [facility] ED TTS Assessment: In system Is this a Tele or Face-to-Face Assessment?: Face-to-Face Is this an Initial Assessment or a Re-assessment for this encounter?: Initial Assessment Patient Accompanied by:: N/A Language Other than English: No Living Arrangements: Other (Comment) What gender do you identify as?: Male Marital status: Single Living Arrangements:  Non-relatives/Friends(roommates) Can pt return to current living arrangement?: Yes Admission Status: Voluntary Is patient capable of signing voluntary admission?: Yes Referral  Source: Self/Family/Friend Insurance type: Self pay  Medical Screening Exam [facility] Walk-in ONLY) Medical Exam completed: Yes  Crisis Care Plan Living Arrangements: Non-relatives/Friends(roommates) Legal Guardian: (NA) Name of Psychiatrist: None Name of Therapist: None  Education Status Is patient currently in school?: No Is the patient employed, unemployed or receiving disability?: Employed  Risk to self with the past 6 months Suicidal Ideation: No Has patient been a risk to self within the past 6 months prior to admission? : Yes Suicidal Intent: No Has patient had any suicidal intent within the past 6 months prior to admission? : Yes Is patient at risk for suicide?: No Suicidal Plan?: No Has patient had any suicidal plan within the past 6 months prior to admission? : No Specify Current Suicidal Plan: NA Access to Means: No Specify Access to Suicidal Means: NA What has been your use of drugs/alcohol within the last 12 months?: Current use Previous Attempts/Gestures: No How many times?: 0 Other Self Harm Risks: (NA) Triggers for Past Attempts: Unknown Intentional Self Injurious Behavior: None Family Suicide History: No Recent stressful life event(s): Other (Comment)(Family issues) Persecutory voices/beliefs?: No Depression: No Depression Symptoms: (Denies) Substance abuse history and/or treatment for substance abuse?: No Suicide prevention information given to non-admitted patients: Not applicable  Risk to Others within the past 6 months Homicidal Ideation: No Does patient have any lifetime risk of violence toward others beyond the six months prior to admission? : No Thoughts of Harm to Others: No Current Homicidal Intent: No Current Homicidal Plan: No Access to Homicidal Means: No Identified Victim: NA History of harm to others?: No Assessment of Violence: None Noted Violent Behavior Description: NA Does patient have access to weapons?: No Criminal Charges Pending?:  No Describe Pending Criminal Charges: NA Does patient have a court date: No Court Date: (NA) Is patient on probation?: No  Psychosis Hallucinations: None noted Delusions: None noted  Mental Status Report Appearance/Hygiene: Unremarkable Eye Contact: Fair Motor Activity: Freedom of movement Speech: Logical/coherent Level of Consciousness: Quiet/awake Mood: Pleasant Affect: Appropriate to circumstance Anxiety Level: Minimal Thought Processes: Coherent, Relevant Judgement: Partial Orientation: Person, Place, Time Obsessive Compulsive Thoughts/Behaviors: None  Cognitive Functioning Concentration: Normal Memory: Recent Intact, Remote Intact Is patient IDD: No Insight: Fair Impulse Control: Fair Appetite: Good Have you had any weight changes? : No Change Amount of the weight change? (lbs): (NA) Sleep: No Change Total Hours of Sleep: 6 Vegetative Symptoms: None  ADLScreening [facility] Assessment Services) Patient's cognitive ability adequate to safely complete daily activities?: Yes Patient able to express need for assistance with ADLs?: Yes Independently performs ADLs?: Yes (appropriate for developmental age)  Prior Inpatient Therapy Prior Inpatient Therapy: Yes Prior Therapy Dates: 2019, 2018 [facility] Reason for Treatment: MH issues  Prior Outpatient Therapy Prior Outpatient Therapy: No Does patient have an ACCT team?: No Does patient have Intensive In-House Services?  : No Does patient have [facility] services? : No Does patient have P4CC services?: No  ADL Screening (condition at time of admission) Patient's cognitive ability adequate to safely complete daily activities?: Yes Is the patient deaf or have difficulty hearing?: No Does the patient have difficulty seeing, even when wearing glasses/contacts?: No Does the patient have difficulty concentrating, remembering, or making decisions?: No Patient able to express need for  assistance with ADLs?: Yes Does the patient have difficulty dressing or bathing?: No Independently  performs ADLs?: Yes (appropriate for developmental age) Does the patient have difficulty walking or climbing stairs?: No Weakness of Legs: None Weakness of Arms/Hands: None  Home Assistive Devices/Equipment Home Assistive Devices/Equipment: None  Therapy Consults (therapy consults require a physician order) PT Evaluation Needed: No OT Evalulation Needed: No SLP Evaluation Needed: No Abuse/Neglect Assessment (Assessment to be complete while patient is alone) Physical Abuse: Denies Verbal Abuse: Denies Sexual Abuse: Denies Exploitation of patient/patient's resources: Denies Self-Neglect: Denies Values / Beliefs Cultural Requests During Hospitalization: None Spiritual Requests During Hospitalization: None Consults Spiritual Care Consult Needed: No Social Work Consult Needed: No Regulatory affairs officer (For Healthcare) Does Patient Have a Medical Advance Directive?: No Would patient like information on creating a medical advance directive?: No - Patient declined Nutrition Screen- [facility] Patient's home diet: Regular        Disposition: Per Hall Busing NP case discussed with Dr. Mallie Darting, who agrees with recommendation for discharge. Disposition Initial Assessment Completed for this Encounter: Yes Disposition of Patient: Discharge Patient refused recommended treatment: No Mode of transportation if patient is discharged/movement?: Car Patient referred to: Outpatient clinic referral  On Site Evaluation by:   Reviewed with Physician:    Mamie Nick 04/12/2019 11:19 AM

## 2019-04-16 ENCOUNTER — Other Ambulatory Visit: Payer: Self-pay

## 2019-04-16 ENCOUNTER — Encounter (HOSPITAL_COMMUNITY): Payer: Self-pay | Admitting: Obstetrics and Gynecology

## 2019-04-16 ENCOUNTER — Emergency Department (HOSPITAL_COMMUNITY)
Admission: EM | Admit: 2019-04-16 | Discharge: 2019-04-17 | Disposition: A | Discharge status: Home / Self Care | Payer: Self-pay | Attending: Emergency Medicine | Admitting: Emergency Medicine

## 2019-04-16 DIAGNOSIS — F1110 Opioid abuse, uncomplicated: Secondary | ICD-10-CM | POA: Insufficient documentation

## 2019-04-16 DIAGNOSIS — Z79899 Other long term (current) drug therapy: Secondary | ICD-10-CM | POA: Insufficient documentation

## 2019-04-16 DIAGNOSIS — F1010 Alcohol abuse, uncomplicated: Secondary | ICD-10-CM | POA: Insufficient documentation

## 2019-04-16 DIAGNOSIS — F17200 Nicotine dependence, unspecified, uncomplicated: Secondary | ICD-10-CM | POA: Insufficient documentation

## 2019-04-16 LAB — COMPREHENSIVE METABOLIC PANEL
ALT: 27 U/L (ref 0–44)
AST: 30 U/L (ref 15–41)
Albumin: 4.5 g/dL (ref 3.5–5.0)
Alkaline Phosphatase: 69 U/L (ref 38–126)
Anion gap: 11 (ref 5–15)
BUN: 5 mg/dL — ABNORMAL LOW (ref 6–20)
CO2: 25 mmol/L (ref 22–32)
Calcium: 8.8 mg/dL — ABNORMAL LOW (ref 8.9–10.3)
Chloride: 105 mmol/L (ref 98–111)
Creatinine, Ser: 0.54 mg/dL — ABNORMAL LOW (ref 0.61–1.24)
GFR calc Af Amer: >60 mL/min (ref >60)
GFR calc non Af Amer: >60 mL/min (ref >60)
Glucose, Bld: 87 mg/dL (ref 70–99)
Potassium: 3.8 mmol/L (ref 3.5–5.1)
Sodium: 141 mmol/L (ref 135–145)
Total Bilirubin: 0.6 mg/dL (ref 0.3–1.2)
Total Protein: 7.7 g/dL (ref 6.5–8.1)

## 2019-04-16 LAB — CBC WITH DIFFERENTIAL/PLATELET
Abs Immature Granulocytes: 0.01 10*3/uL (ref 0.00–0.07)
Basophils Absolute: 0.1 10*3/uL (ref 0.0–0.1)
Basophils Relative: 1 %
Eosinophils Absolute: 0.2 10*3/uL (ref 0.0–0.5)
Eosinophils Relative: 2 %
HCT: 44.4 % (ref 39.0–52.0)
Hemoglobin: 15.3 g/dL (ref 13.0–17.0)
Immature Granulocytes: 0 %
Lymphocytes Relative: 42 %
Lymphs Abs: 2.8 10*3/uL (ref 0.7–4.0)
MCH: 31.2 pg (ref 26.0–34.0)
MCHC: 34.5 g/dL (ref 30.0–36.0)
MCV: 90.4 fL (ref 80.0–100.0)
Monocytes Absolute: 0.5 10*3/uL (ref 0.1–1.0)
Monocytes Relative: 7 %
Neutro Abs: 3.1 10*3/uL (ref 1.7–7.7)
Neutrophils Relative %: 48 %
Platelets: 283 10*3/uL (ref 150–400)
RBC: 4.91 MIL/uL (ref 4.22–5.81)
RDW: 14 % (ref 11.5–15.5)
WBC: 6.5 10*3/uL (ref 4.0–10.5)
nRBC: 0 % (ref 0.0–0.2)

## 2019-04-16 LAB — URINALYSIS, ROUTINE W REFLEX MICROSCOPIC
Bilirubin Urine: NEGATIVE
Glucose, UA: NEGATIVE mg/dL
Hgb urine dipstick: NEGATIVE
Ketones, ur: NEGATIVE mg/dL
Leukocytes,Ua: NEGATIVE
Nitrite: NEGATIVE
Protein, ur: NEGATIVE mg/dL
Specific Gravity, Urine: 1.002 — ABNORMAL LOW (ref 1.005–1.030)
pH: 5 (ref 5.0–8.0)

## 2019-04-16 LAB — RAPID URINE DRUG SCREEN, HOSP PERFORMED
Amphetamines: NOT DETECTED
Barbiturates: NOT DETECTED
Benzodiazepines: NOT DETECTED
Cocaine: NOT DETECTED
Opiates: NOT DETECTED
Tetrahydrocannabinol: NOT DETECTED

## 2019-04-16 LAB — ETHANOL: Alcohol, Ethyl (B): 335 mg/dL (ref <10)

## 2019-04-16 LAB — TROPONIN I (HIGH SENSITIVITY): Troponin I (High Sensitivity): <2 ng/L (ref <18)

## 2019-04-16 MED ORDER — SODIUM CHLORIDE 0.9 % IV BOLUS
1000.0000 mL | Freq: Once | INTRAVENOUS | Status: AC
  Administered 2019-04-16: 1000 mL via INTRAVENOUS
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
## 2019-04-17 NOTE — Discharge Instructions (Addendum)
You were evaluated in the Emergency Department and after careful evaluation, we did not find any emergent condition requiring admission or further testing in the hospital.  Your exam/testing today is overall reassuring.  Please return to the Emergency Department if you experience any worsening of your condition.  We encourage you to follow up with a primary care provider.  Thank you for allowing us to be a part of your care. 

## 2019-04-17 NOTE — ED Provider Notes (Addendum)
  Provider Note MRN:  [id number]  Arrival date & time: 04/17/19    ED Course and Medical Decision Making  Assumed care from Dr. Alvino Chapel at shift change.  Will allow for further metabolization and then reassess.  Anticipating discharge.  3 AM update: Upon reassessment patient is clinically sober and appropriate for discharge.  Procedures  Final Clinical Impressions(s) / ED Diagnoses     ICD-10-CM   1. Alcohol abuse  F10.10   2. Opioid abuse [facility])  F11.10     ED Discharge Orders    None      Discharge Instructions   None     Barth Kirks. [place] Small, [place] [email or web address]    Maudie Flakes, MD 04/17/19 0018    Maudie Flakes, MD 04/17/19 [phone]

## 2019-05-06 IMAGING — CT CT CERVICAL SPINE W/O CM
3 of 7 series · 10 of 33 positions shown, 11 images · non-contrast
Comparison: 09/23/2017, 11/22/2012

CLINICAL DATA: Patient reports drinking normally alcohol for the
past 2 days and taking 3 OxyContin at 0235 hours.

EXAM:
CT HEAD WITHOUT CONTRAST
CT CERVICAL SPINE WITHOUT CONTRAST
TECHNIQUE: Multidetector CT imaging of the head and cervical spine was
performed following the standard protocol without intravenous
contrast. Multiplanar CT image reconstructions of the cervical spine
were also generated.

[Series 11: orthogonal bone · axial · 0.23mm/px · z∈[+1016,+1175]mm · 4 of 125 slices shown, 5 images]
[im 21/125  soft-tissue]
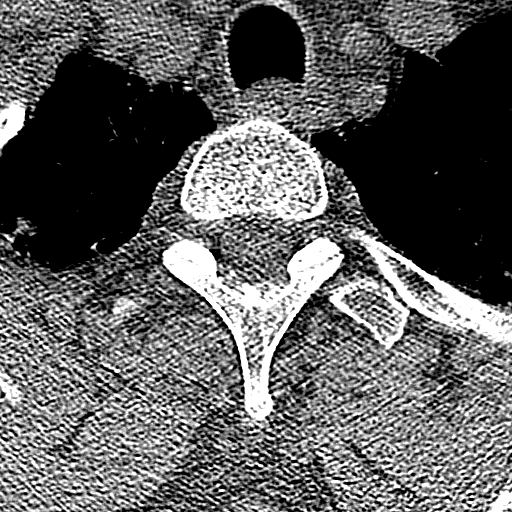
[im 21/125  bone]
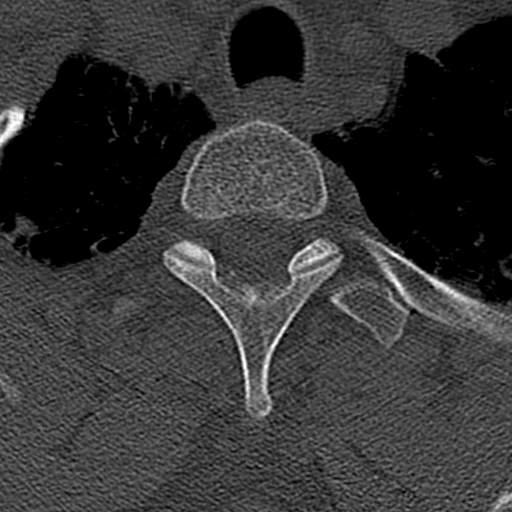
[im 42/125  bone]
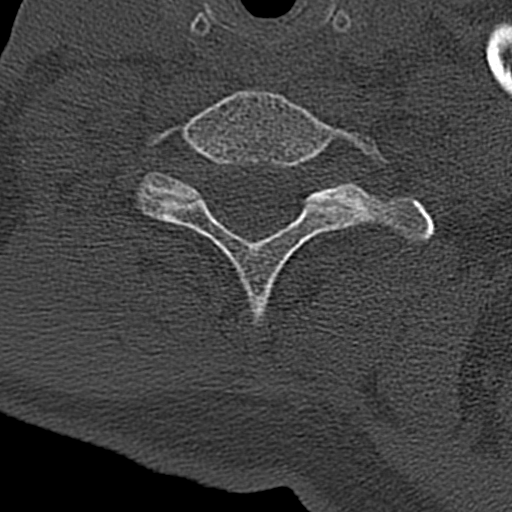
[im 83/125  bone]
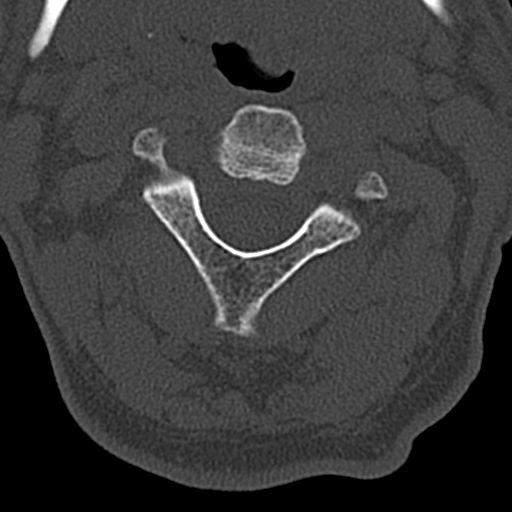
[im 104/125  bone]
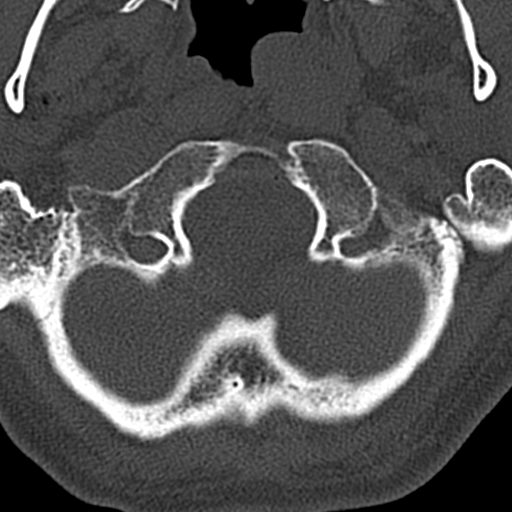

[Series 12: coronal bone · coronal · 0.23mm/px · 1 of 61 slices shown]
[im 31/61  bone]
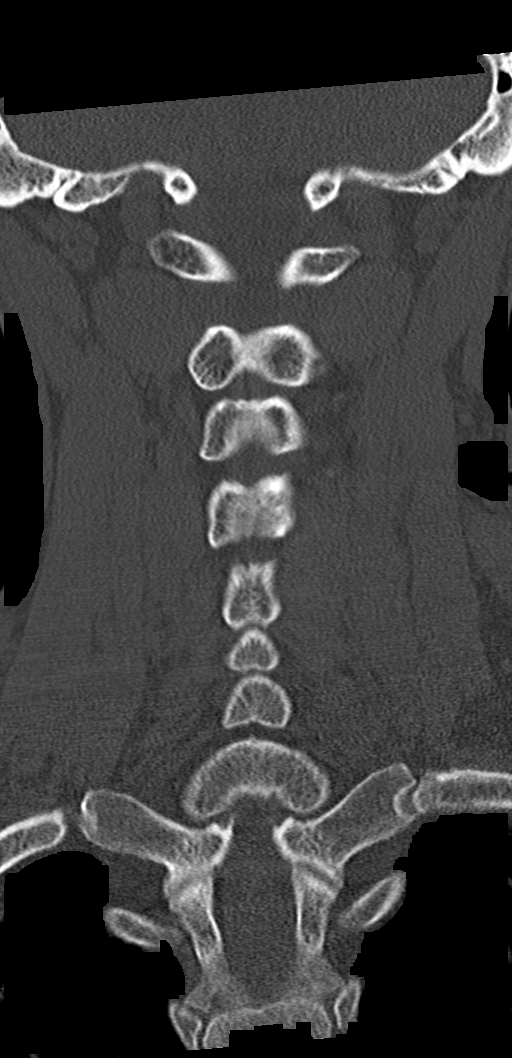

[Series 13: sagittal bone · sagittal · 0.23mm/px · 5 of 61 slices shown]
[im 11/61  bone]
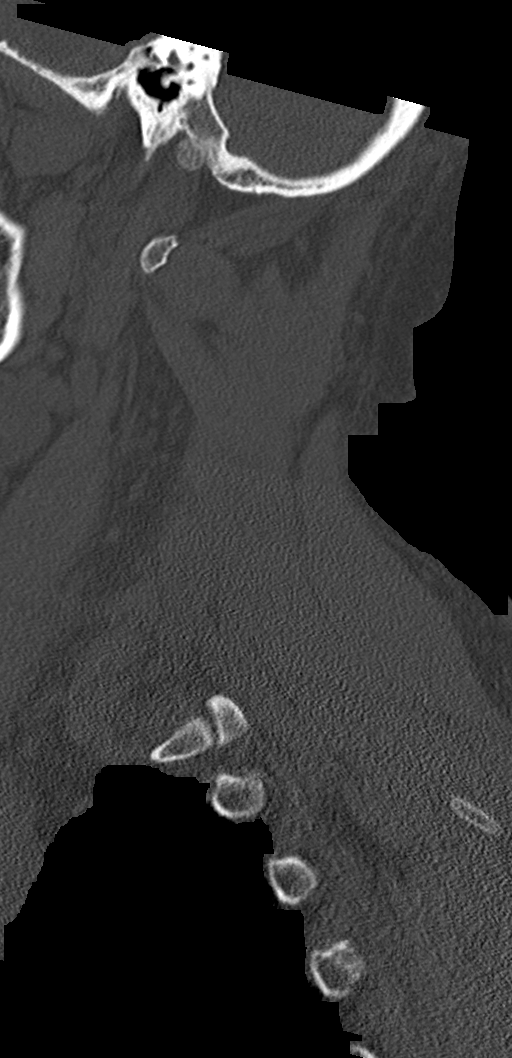
[im 21/61  bone]
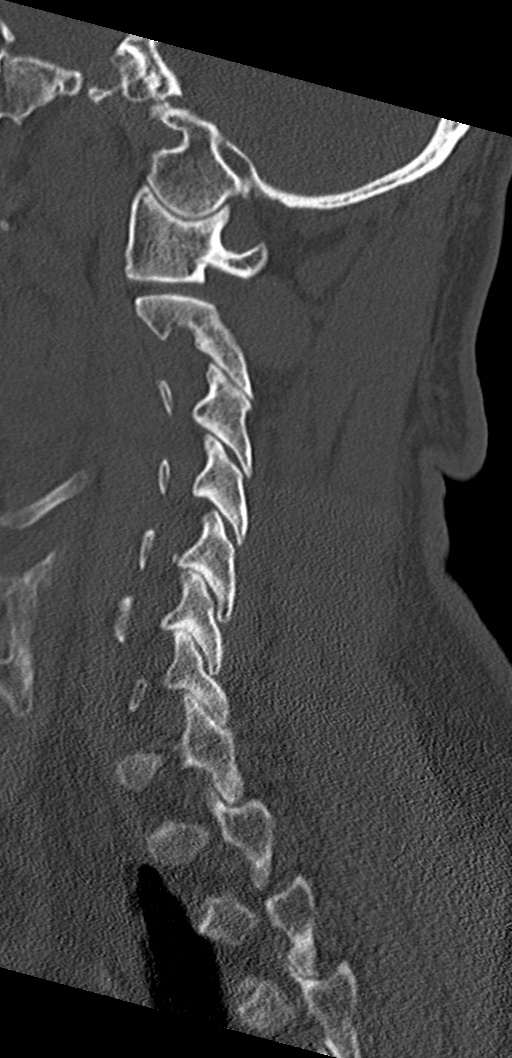
[im 31/61  bone]
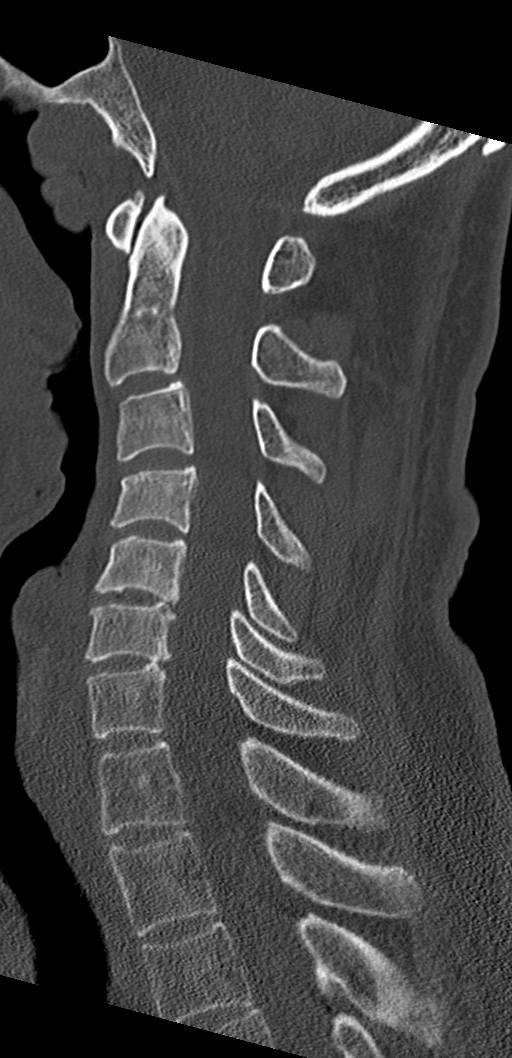
[im 41/61  bone]
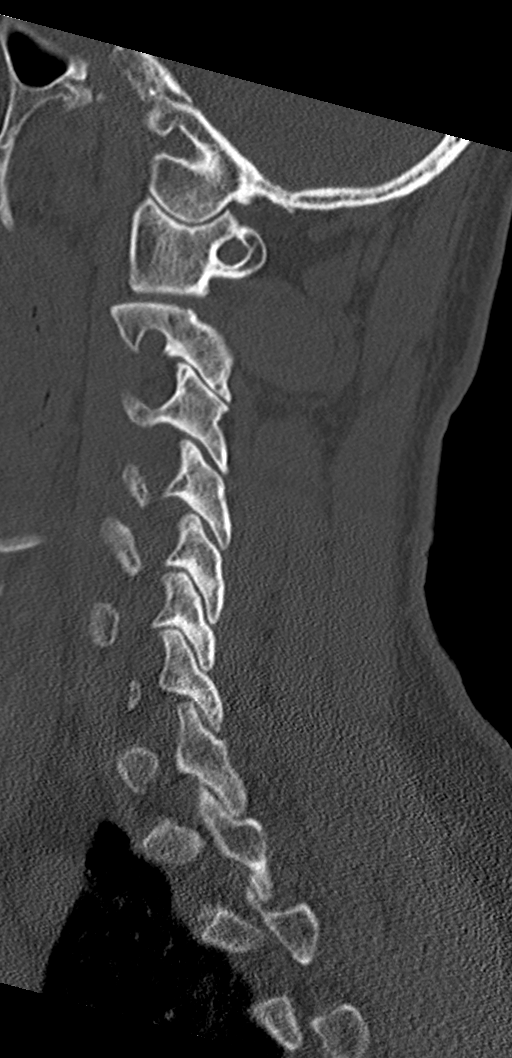
[im 51/61  bone]
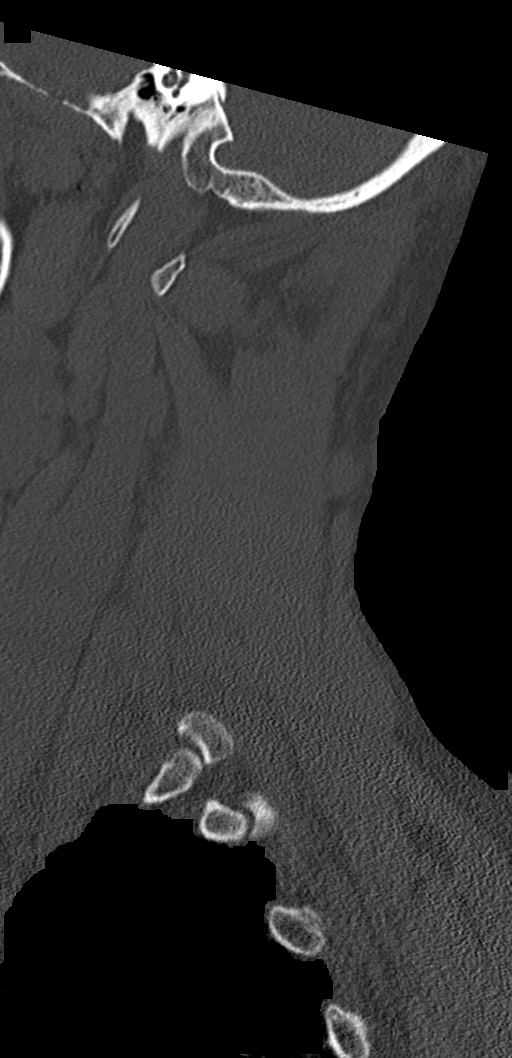

[10 of 33 positions shown; findings below may reference images not displayed]

FINDINGS: CT HEAD FINDINGS

BRAIN: The ventricles and sulci are normal. No intraparenchymal
hemorrhage, mass effect nor midline shift. No acute large vascular
territory infarcts. No abnormal extra-axial fluid collections. Basal
cisterns are midline and not effaced. No acute cerebellar
abnormality.

VASCULAR: Unremarkable.

SKULL/SOFT TISSUES: No skull fracture. No significant soft tissue
swelling.

ORBITS/SINUSES: The included ocular globes and orbital contents are
normal.Chronic thickening of the included left maxillary sinus walls
compatible with stigmata chronic sinusitis. Moderate ethmoid sinus
mucosal thickening is noted. No air-fluid levels are visualized.

OTHER: Asymmetric prominence of the left adenoidal soft tissues,
chronic in appearance dating back to 9501, chronic and likely
reactive.

CT CERVICAL SPINE FINDINGS

ALIGNMENT: Vertebral bodies in alignment. Reversal of cervical
lordosis.

SKULL BASE AND VERTEBRAE: Cervical vertebral bodies and posterior
elements are intact. Intervertebral disc heights preserved. No
destructive bony lesions. C1-2 articulation maintained.

SOFT TISSUES AND SPINAL CANAL: Normal.

DISC LEVELS: No significant osseous canal stenosis or neural
foraminal narrowing. Mild degenerative disc narrowing mainly at C5-6
and C6-7.

UPPER CHEST: Chronic stable biapical pleural and pulmonary scarring.

OTHER: Mild atherosclerosis of the carotid bifurcations.
IMPRESSION: CT head:

1. No acute intracranial abnormality.
2. Stigmata of chronic left maxillary sinusitis.

CT cervical spine:

1. Asymmetric prominence of the left adenoidal soft tissues likely
representing lymphoid tissue. This may be reactive in etiology and
has been seen dating back to 9501. ENT correlation [DATE]. No acute cervical spine fracture or static listhesis.
3. Mild degenerative disc disease C5-6 and C6-7

## 2019-06-05 ENCOUNTER — Emergency Department (HOSPITAL_COMMUNITY)
Admission: EM | Admit: 2019-06-05 | Discharge: 2019-06-06 | Disposition: A | Discharge status: Home / Self Care | Payer: Self-pay | Attending: Emergency Medicine | Admitting: Emergency Medicine

## 2019-06-05 ENCOUNTER — Encounter (HOSPITAL_COMMUNITY): Payer: Self-pay | Admitting: Emergency Medicine

## 2019-06-05 ENCOUNTER — Other Ambulatory Visit: Payer: Self-pay

## 2019-06-05 ENCOUNTER — Emergency Department (HOSPITAL_COMMUNITY): Payer: Self-pay

## 2019-06-05 DIAGNOSIS — Y906 Blood alcohol level of 120-199 mg/100 ml: Secondary | ICD-10-CM | POA: Insufficient documentation

## 2019-06-05 DIAGNOSIS — K4091 Unilateral inguinal hernia, without obstruction or gangrene, recurrent: Secondary | ICD-10-CM | POA: Insufficient documentation

## 2019-06-05 DIAGNOSIS — R1032 Left lower quadrant pain: Secondary | ICD-10-CM

## 2019-06-05 DIAGNOSIS — F10920 Alcohol use, unspecified with intoxication, uncomplicated: Secondary | ICD-10-CM | POA: Insufficient documentation

## 2019-06-05 DIAGNOSIS — F17210 Nicotine dependence, cigarettes, uncomplicated: Secondary | ICD-10-CM | POA: Insufficient documentation

## 2019-06-05 DIAGNOSIS — Z79899 Other long term (current) drug therapy: Secondary | ICD-10-CM | POA: Insufficient documentation

## 2019-06-05 LAB — COMPREHENSIVE METABOLIC PANEL
ALT: 24 U/L (ref 0–44)
AST: 24 U/L (ref 15–41)
Albumin: 4.7 g/dL (ref 3.5–5.0)
Alkaline Phosphatase: 62 U/L (ref 38–126)
Anion gap: 13 (ref 5–15)
BUN: 9 mg/dL (ref 6–20)
CO2: 21 mmol/L — ABNORMAL LOW (ref 22–32)
Calcium: 8.9 mg/dL (ref 8.9–10.3)
Chloride: 104 mmol/L (ref 98–111)
Creatinine, Ser: 0.6 mg/dL — ABNORMAL LOW (ref 0.61–1.24)
GFR calc Af Amer: >60 mL/min (ref >60)
GFR calc non Af Amer: >60 mL/min (ref >60)
Glucose, Bld: 90 mg/dL (ref 70–99)
Potassium: 4.4 mmol/L (ref 3.5–5.1)
Sodium: 138 mmol/L (ref 135–145)
Total Bilirubin: 0.8 mg/dL (ref 0.3–1.2)
Total Protein: 8 g/dL (ref 6.5–8.1)

## 2019-06-05 LAB — CBC
HCT: 48.5 % (ref 39.0–52.0)
Hemoglobin: 16.4 g/dL (ref 13.0–17.0)
MCH: 31.2 pg (ref 26.0–34.0)
MCHC: 33.8 g/dL (ref 30.0–36.0)
MCV: 92.2 fL (ref 80.0–100.0)
Platelets: 350 10*3/uL (ref 150–400)
RBC: 5.26 MIL/uL (ref 4.22–5.81)
RDW: 14.3 % (ref 11.5–15.5)
WBC: 8 10*3/uL (ref 4.0–10.5)
nRBC: 0 % (ref 0.0–0.2)

## 2019-06-05 LAB — LIPASE, BLOOD: Lipase: 28 U/L (ref 11–51)

## 2019-06-05 MED ORDER — SODIUM CHLORIDE (PF) 0.9 % IJ SOLN
INTRAMUSCULAR | Status: AC
  Filled 2019-06-05: qty 50

## 2019-06-05 MED ORDER — SODIUM CHLORIDE 0.9% FLUSH
3.0000 mL | Freq: Once | INTRAVENOUS | Status: AC
  Administered 2019-06-05: 3 mL via INTRAVENOUS

## 2019-06-05 MED ORDER — ONDANSETRON HCL 4 MG/2ML IJ SOLN
4.0000 mg | Freq: Once | INTRAMUSCULAR | Status: AC
  Administered 2019-06-05: 4 mg via INTRAVENOUS
  Filled 2019-06-05: qty 2

## 2019-06-05 MED ORDER — SODIUM CHLORIDE 0.9 % IV BOLUS
1000.0000 mL | Freq: Once | INTRAVENOUS | Status: AC
  Administered 2019-06-05: 1000 mL via INTRAVENOUS

## 2019-06-05 MED ORDER — MORPHINE SULFATE (PF) 4 MG/ML IV SOLN
4.0000 mg | Freq: Once | INTRAVENOUS | Status: AC
  Administered 2019-06-05: 4 mg via INTRAVENOUS
  Filled 2019-06-05: qty 1

## 2019-06-05 MED ORDER — IOHEXOL 300 MG/ML  SOLN
100.0000 mL | Freq: Once | INTRAMUSCULAR | Status: AC | PRN
  Administered 2019-06-05: 100 mL via INTRAVENOUS

## 2019-06-05 NOTE — ED Notes (Signed)
Pt reminded of need for urine sample, fluid bolus initiated as well to help.

## 2019-06-05 NOTE — ED Triage Notes (Signed)
Per [facility] pt reports having LLQ pains that started about hour ago. Hx hernia.

## 2019-06-05 NOTE — ED Notes (Signed)
PA at bedside.
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
## 2019-06-06 LAB — ETHANOL: Alcohol, Ethyl (B): 194 mg/dL — ABNORMAL HIGH (ref <10)

## 2019-06-06 NOTE — ED Provider Notes (Signed)
59 year old male received at signout from [facility] [place] pending metabolization of ethanol.  "Andrew Rivas is a 59 y.o. male with PMHx alcohol abuse who presents to the ED today complaining of sudden onset, constant, waxing and waning, left lower quadrant abdominal pain x 2 days. Pt also complains of nausea. He reports he has a hernia that popped out and has been causing his pain. Pt does have hx of alcohol abuse and states he last drank 2-3 beers earlier today. Pt has not taken anything for the pain. Denies fever, chills, chest pain, shortness of breath, vomiting, diarrhea, constipation, blood in stool, testicular pain, or any other associated symptoms.   The history is provided by the patient and medical records."  Physical Exam  BP 115/82   Pulse 91   Temp (!) 97.4 F (36.3 C) (Oral)   Resp 15   SpO2 91%   Physical Exam Vitals and nursing note reviewed.  Constitutional:      General: He is not in acute distress.    Appearance: He is well-developed. He is not ill-appearing, toxic-appearing or diaphoretic.     Comments: NAD.   HENT:     Head: Normocephalic and atraumatic.  Pulmonary:     Effort: Pulmonary effort is normal.     Comments: Speaks in complete, fluent sentences. Musculoskeletal:     Cervical back: Neck supple.  Neurological:     Mental Status: He is alert and oriented to person, place, and time.     Cranial Nerves: No cranial nerve deficit.     Comments: No slurred speech.   Psychiatric:        Behavior: Behavior normal.     ED Course/Procedures     Procedures  MDM  59 year old male received in signout from [facility] [place] pending metabolization of ethanol.  Please see her note for further work-up and medical decision making.  He was seen for a hernia that is able to be reduced on exam.  CT abdomen pelvis is unremarkable.  Labs are notable for ethanol level of 194 when drawn 2 hours ago.  Labs are otherwise reassuring.  Nursing staff reports that the patient has  ambulated in the department without ataxia.  He has no slurred speech on my exam.  He appears clinically sober.  No evidence of withdrawal.  Will discharge with follow-up to [facility] surgery.  All questions answered.  He is hemodynamically stable and in no acute distress.  Safe for discharge home with outpatient follow-up as indicated.      Frederik Pear A, PA-C 06/06/19 0058    Paula Libra, MD 06/06/19 [phone]

## 2019-06-06 NOTE — Discharge Instructions (Addendum)
Please follow up with [facility] regarding your hernia to discuss surgical options.  Their contact information is listed above.  Return to the emergency department if you become unable to reduce your hernia, if you develop high fevers, severe abdominal pain, or persistent vomiting when your hernia cannot be pushed back in.

## 2019-06-06 NOTE — ED Notes (Signed)
Pt states he still cannot provide a urine sample at this time.

## 2019-06-13 ENCOUNTER — Emergency Department (HOSPITAL_COMMUNITY)
Admission: EM | Admit: 2019-06-13 | Discharge: 2019-06-14 | Disposition: A | Discharge status: Home / Self Care | Payer: Self-pay | Attending: Emergency Medicine | Admitting: Emergency Medicine

## 2019-06-13 ENCOUNTER — Encounter (HOSPITAL_COMMUNITY): Payer: Self-pay | Admitting: *Deleted

## 2019-06-13 DIAGNOSIS — K4090 Unilateral inguinal hernia, without obstruction or gangrene, not specified as recurrent: Secondary | ICD-10-CM

## 2019-06-13 DIAGNOSIS — F10920 Alcohol use, unspecified with intoxication, uncomplicated: Secondary | ICD-10-CM

## 2019-06-13 DIAGNOSIS — F10129 Alcohol abuse with intoxication, unspecified: Secondary | ICD-10-CM | POA: Insufficient documentation

## 2019-06-13 DIAGNOSIS — F17210 Nicotine dependence, cigarettes, uncomplicated: Secondary | ICD-10-CM | POA: Insufficient documentation

## 2019-06-13 HISTORY — DX: Depression, unspecified: F32.A
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
## 2019-06-13 NOTE — ED Triage Notes (Signed)
EMS states they picked pt up at [place], ETOH, LLQ pain, has hernia in location. 140/90-104-98% -18

## 2019-06-14 MED ORDER — IBUPROFEN 200 MG PO TABS
600.0000 mg | ORAL_TABLET | Freq: Once | ORAL | Status: AC
  Administered 2019-06-14: 600 mg via ORAL
  Filled 2019-06-14: qty 3

## 2019-06-14 NOTE — ED Notes (Signed)
Pt told NT Myriam Jacobson that he was late and left AMA. She asked if he wanted to wait to speak with the provider, he said that he needed to leave now because he was late.

## 2019-06-14 NOTE — ED Notes (Signed)
Pt given water for the fluid challenge. Pt able to drink water with no reported nausea.

## 2019-06-14 NOTE — Discharge Instructions (Signed)
Thank you for allowing me to care for you today in the Emergency Department.   Follow-up with general surgery.  I have placed a consult for social work to call you tomorrow regarding your financial concerns.  Take 650 mg of Tylenol or 600 mg of ibuprofen with food every 6 hours for pain.  You can alternate between these 2 medications every 3 hours if your pain returns.  For instance, you can take Tylenol at noon, followed by a dose of ibuprofen at 3, followed by second dose of Tylenol and 6.  Avoid Tylenol if you are drinking alcohol as this is harmful to your liver.  Return to the emergency department if you are unable to push the hernia back into place, if you develop high fevers, vomiting, if you become unable to urinate, or or if you develop severe constipation and stop passing gas or develop other new, concerning symptoms.

## 2019-06-20 NOTE — Progress Notes (Signed)
06/20/2019 Chart reviewed to address referral, noted pt left AMA. Isidoro Donning RN CCM, [facility] ED TOC CM [phone]

## 2019-10-03 ENCOUNTER — Emergency Department (HOSPITAL_COMMUNITY): Payer: Self-pay

## 2019-10-03 ENCOUNTER — Encounter (HOSPITAL_COMMUNITY): Payer: Self-pay

## 2019-10-03 ENCOUNTER — Other Ambulatory Visit: Payer: Self-pay

## 2019-10-03 ENCOUNTER — Emergency Department (HOSPITAL_COMMUNITY)
Admission: EM | Admit: 2019-10-03 | Discharge: 2019-10-05 | Disposition: A | Discharge status: Home / Self Care | Payer: Self-pay | Attending: Emergency Medicine | Admitting: Emergency Medicine

## 2019-10-03 DIAGNOSIS — Z20822 Contact with and (suspected) exposure to covid-19: Secondary | ICD-10-CM | POA: Insufficient documentation

## 2019-10-03 DIAGNOSIS — F329 Major depressive disorder, single episode, unspecified: Secondary | ICD-10-CM | POA: Insufficient documentation

## 2019-10-03 DIAGNOSIS — R0789 Other chest pain: Secondary | ICD-10-CM | POA: Insufficient documentation

## 2019-10-03 DIAGNOSIS — F17210 Nicotine dependence, cigarettes, uncomplicated: Secondary | ICD-10-CM | POA: Insufficient documentation

## 2019-10-03 DIAGNOSIS — Y908 Blood alcohol level of 240 mg/100 ml or more: Secondary | ICD-10-CM | POA: Insufficient documentation

## 2019-10-03 DIAGNOSIS — F1020 Alcohol dependence, uncomplicated: Secondary | ICD-10-CM | POA: Insufficient documentation

## 2019-10-03 DIAGNOSIS — F1024 Alcohol dependence with alcohol-induced mood disorder: Secondary | ICD-10-CM | POA: Insufficient documentation

## 2019-10-03 DIAGNOSIS — R45851 Suicidal ideations: Secondary | ICD-10-CM | POA: Insufficient documentation

## 2019-10-03 DIAGNOSIS — F10920 Alcohol use, unspecified with intoxication, uncomplicated: Secondary | ICD-10-CM

## 2019-10-03 LAB — COMPREHENSIVE METABOLIC PANEL
ALT: 215 U/L — ABNORMAL HIGH (ref 0–44)
AST: 162 U/L — ABNORMAL HIGH (ref 15–41)
Albumin: 4 g/dL (ref 3.5–5.0)
Alkaline Phosphatase: 68 U/L (ref 38–126)
Anion gap: 14 (ref 5–15)
BUN: 6 mg/dL (ref 6–20)
CO2: 20 mmol/L — ABNORMAL LOW (ref 22–32)
Calcium: 8.3 mg/dL — ABNORMAL LOW (ref 8.9–10.3)
Chloride: 95 mmol/L — ABNORMAL LOW (ref 98–111)
Creatinine, Ser: 0.57 mg/dL — ABNORMAL LOW (ref 0.61–1.24)
GFR calc Af Amer: >60 mL/min (ref >60)
GFR calc non Af Amer: >60 mL/min (ref >60)
Glucose, Bld: 100 mg/dL — ABNORMAL HIGH (ref 70–99)
Potassium: 4.1 mmol/L (ref 3.5–5.1)
Sodium: 129 mmol/L — ABNORMAL LOW (ref 135–145)
Total Bilirubin: 0.7 mg/dL (ref 0.3–1.2)
Total Protein: 6.8 g/dL (ref 6.5–8.1)

## 2019-10-03 LAB — RAPID URINE DRUG SCREEN, HOSP PERFORMED
Amphetamines: NOT DETECTED
Barbiturates: NOT DETECTED
Benzodiazepines: NOT DETECTED
Cocaine: NOT DETECTED
Opiates: NOT DETECTED
Tetrahydrocannabinol: NOT DETECTED

## 2019-10-03 LAB — ETHANOL: Alcohol, Ethyl (B): 401 mg/dL (ref <10)

## 2019-10-03 LAB — CBC
HCT: 43.9 % (ref 39.0–52.0)
Hemoglobin: 14.5 g/dL (ref 13.0–17.0)
MCH: 31.3 pg (ref 26.0–34.0)
MCHC: 33 g/dL (ref 30.0–36.0)
MCV: 94.6 fL (ref 80.0–100.0)
Platelets: 211 10*3/uL (ref 150–400)
RBC: 4.64 MIL/uL (ref 4.22–5.81)
RDW: 13.4 % (ref 11.5–15.5)
WBC: 6 10*3/uL (ref 4.0–10.5)
nRBC: 0 % (ref 0.0–0.2)

## 2019-10-03 LAB — TROPONIN I (HIGH SENSITIVITY)
Troponin I (High Sensitivity): 3 ng/L (ref <18)
Troponin I (High Sensitivity): 3 ng/L (ref <18)

## 2019-10-03 LAB — ACETAMINOPHEN LEVEL: Acetaminophen (Tylenol), Serum: <10 ug/mL — ABNORMAL LOW (ref 10–30)

## 2019-10-03 LAB — SARS CORONAVIRUS 2 BY RT PCR (HOSPITAL ORDER, PERFORMED IN ~~LOC~~ HOSPITAL LAB): SARS Coronavirus 2: NEGATIVE

## 2019-10-03 LAB — SALICYLATE LEVEL: Salicylate Lvl: <7 mg/dL — ABNORMAL LOW (ref 7.0–30.0)

## 2019-10-03 MED ORDER — LORAZEPAM 1 MG PO TABS: 0.0000 mg | ORAL_TABLET | Freq: Two times a day (BID) | ORAL | Status: DC

## 2019-10-03 MED ORDER — THIAMINE HCL 100 MG/ML IJ SOLN
100.0000 mg | Freq: Every day | INTRAMUSCULAR | Status: DC
  Administered 2019-10-03: 100 mg via INTRAVENOUS
  Filled 2019-10-03: qty 2

## 2019-10-03 MED ORDER — THIAMINE HCL 100 MG PO TABS
100.0000 mg | ORAL_TABLET | Freq: Every day | ORAL | Status: DC
  Administered 2019-10-04 – 2019-10-05 (×2): 100 mg via ORAL
  Filled 2019-10-03 (×2): qty 1

## 2019-10-03 MED ORDER — ONDANSETRON 4 MG PO TBDP: 4.0000 mg | ORAL_TABLET | Freq: Once | ORAL | Status: DC

## 2019-10-03 MED ORDER — LORAZEPAM 2 MG/ML IJ SOLN: 0.0000 mg | Freq: Four times a day (QID) | INTRAMUSCULAR | Status: DC

## 2019-10-03 MED ORDER — LORAZEPAM 1 MG PO TABS
0.0000 mg | ORAL_TABLET | Freq: Four times a day (QID) | ORAL | Status: DC
  Administered 2019-10-04 (×2): 1 mg via ORAL
  Filled 2019-10-03 (×3): qty 1

## 2019-10-03 MED ORDER — ONDANSETRON HCL 4 MG/2ML IJ SOLN: 4.0000 mg | Freq: Once | INTRAMUSCULAR | Status: DC

## 2019-10-03 MED ORDER — LORAZEPAM 2 MG/ML IJ SOLN: 0.0000 mg | Freq: Two times a day (BID) | INTRAMUSCULAR | Status: DC
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
## 2019-10-03 NOTE — ED Notes (Signed)
Patient transferred to imaging dept via stretcher
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
## 2019-10-03 NOTE — ED Notes (Signed)
In GREEN 12 for TTS

## 2019-10-03 NOTE — ED Triage Notes (Signed)
Pt bib [facility] w/ c/o SI. Pt found outside [employer] obvious ETOH use. Pt c/o chest pain. EMS VSS.

## 2019-10-04 MED ORDER — LORAZEPAM 1 MG PO TABS
1.0000 mg | ORAL_TABLET | Freq: Once | ORAL | Status: AC
  Administered 2019-10-04: 1 mg via ORAL
  Filled 2019-10-04: qty 1

## 2019-10-04 NOTE — ED Notes (Signed)
Woke pt so Telepsych may be performed.

## 2019-10-04 NOTE — Progress Notes (Addendum)
Patient ID: Andrew Rivas, male   DOB: 09-Feb-1961, 59 y.o.   MRN: [id number]   Chrissie Noa seen and evaluated by nurse practitioner and TTS counselor .  Continues to report suicidal ideation due to substance abuse addiction. BAL 401 on admmisson. Patient stated that " I feel crappy."  Continue to recommend overnight observation for ETOH withdrawal symptoms. Patient presents with similar concerns 03/2019. NP will restart home medication where appropriate. staff to continue to monitor for safety.  Support, encouragement and reassurance was provided

## 2019-10-04 NOTE — ED Notes (Signed)
Pts belongings placed in Locker #1 in purple zone. Pts cell phone has been locked up by security at this time.

## 2019-10-04 NOTE — ED Notes (Signed)
Lying on bed watching tv. Ate dinner.

## 2019-10-04 NOTE — ED Notes (Signed)
Pt sleeping, breathing unlabored, no sweating or tremors noted in sleep

## 2019-10-04 NOTE — ED Notes (Signed)
Pt arrived to [facility] - wearing burgundy scrubs - ambulatory to bed. Pt noted to be alert, oriented, calm, cooperative. Pt laid down on bed - lying w/eyes closed. Respirations even, unlabored. No Sitter available - Suzy Bouchard, Consulting civil engineer, aware.

## 2019-10-04 NOTE — ED Notes (Signed)
Breakfast Ordered 

## 2019-10-05 DIAGNOSIS — R45851 Suicidal ideations: Secondary | ICD-10-CM

## 2019-10-05 NOTE — ED Notes (Addendum)
D/C instructions given and questions answered to satisfaction - Resources discussed and given - ALL Belongings - 1 labeled belongings and 1 valuables envelope - returned to pt - Pt signed verifying all items present.  IV removed from Left [facility] - cath intact.

## 2019-10-05 NOTE — Consult Note (Signed)
Telepsych Consultation   Reason for Consult:  Suicidal ideation/ [facility] Referring Physician:  EPD Location of Patient: [id number] Location of Provider: [facility]  Patient Identification: Andrew Rivas MRN:  [id number] Principal Diagnosis: <principal problem not specified> Diagnosis:  Active Problems:   * No active hospital problems. *   Total Time spent with patient: 15 minutes  Subjective:   Andrew Rivas is a 60 y.o. male was seen and evaluated via teleassessment.  He is awake, alert and oriented x3.  Patient presents with a brighter affect during this assessment.  Denying suicidal or homicidal ideations.  Denies auditory or visual hallucinations.  Reports well needed rest.  Denies any withdrawal symptoms i.e. headaches, nausea, vomiting or hallucinations.  Patient reports he is ready to discharge. "  I will eat real food when I go home" patient to keep follow-up appointment with [facility]. Case staffed during treatment team.  Attending psychiatrist made aware of discharge disposition.  CSW to provide additional outpatient resources for substance abuse.  Support, encouragement and reassurance was provided.   HPI:   Per admission assessment note:Level 5 caveat due to intoxication.  Reportedly brought in by EMS for suicidal ideation.  Found outside [employer].  States has been using pills and alcohol.  States all kinds of pills.  Really will not quantify how much he has been drinking.  Also states he is suicidal.  Somewhat difficult to get history from.  Also reportedly complained of some anterior chest pain.  It is sharp and worse with certain movements.   Past Psychiatric History:   Risk to Self: Suicidal Ideation: Yes-Currently Present Suicidal Intent: Yes-Currently Present Is patient at risk for suicide?: Yes Suicidal Plan?: Yes-Currently Present Specify Current Suicidal Plan: Pt reports intentional overdose on Oxycontin and alcohol in suicide attempt Access to Means:  Yes Specify Access to Suicidal Means: Ingested Oxycontin and alcohol What has been your use of drugs/alcohol within the last 12 months?: Pt reports daily alcohol use How many times?: 2 Other Self Harm Risks: None Triggers for Past Attempts: None known Intentional Self Injurious Behavior: None Risk to Others: Homicidal Ideation: No Thoughts of Harm to Others: No Current Homicidal Intent: No Current Homicidal Plan: No Access to Homicidal Means: No Identified Victim: None History of harm to others?: No Assessment of Violence: None Noted Violent Behavior Description: Pt denies history of violence Does patient have access to weapons?: No Criminal Charges Pending?: No Does patient have a court date: No Prior Inpatient Therapy: Prior Inpatient Therapy: Yes Prior Therapy Dates: 03/2019, Multiple admits Prior Therapy Facilty/Provider(s): [facility], [place] Reason for Treatment: Depression, alcohol use Prior Outpatient Therapy: Prior Outpatient Therapy: No Does patient have an ACCT team?: No Does patient have Intensive In-House Services?  : No Does patient have [facility] services? : No Does patient have P4CC services?: No  Past Medical History:  Past Medical History:  Diagnosis Date  . Alcohol abuse   . Depression   . Medical history non-contributory     Past Surgical History:  Procedure Laterality Date  . HAND SURGERY Left   . NO PAST SURGERIES     Family History: No family history on file. Family Psychiatric  History:  Social History:  Social History   Substance and Sexual Activity  Alcohol Use Yes   Comment: daily      Social History   Substance and Sexual Activity  Drug Use Yes  . Types: Marijuana, Oxycodone    Social History   Socioeconomic History  .  Marital status: Single    Spouse name: Not on file  . Number of children: Not on file  . Years of education: Not on file  . Highest education level: Not on file  Occupational History  . Not on file  Tobacco  Use  . Smoking status: Current Every Day Smoker    Packs/day: 1.00  . Smokeless tobacco: Never Used  Substance and Sexual Activity  . Alcohol use: Yes    Comment: daily   . Drug use: Yes    Types: Marijuana, Oxycodone  . Sexual activity: Not Currently    Birth control/protection: None  Other Topics Concern  . Not on file  Social History Narrative   ** Merged History Encounter **       Social Determinants of Health   Financial Resource Strain:   . Difficulty of Paying Living Expenses:   Food Insecurity:   . Worried About Programme researcher, broadcasting/film/video in the Last Year:   . Barista in the Last Year:   Transportation Needs:   . Freight forwarder (Medical):   Marland Kitchen Lack of Transportation (Non-Medical):   Physical Activity:   . Days of Exercise per Week:   . Minutes of Exercise per Session:   Stress:   . Feeling of Stress :   Social Connections:   . Frequency of Communication with Friends and Family:   . Frequency of Social Gatherings with Friends and Family:   . Attends Religious Services:   . Active Member of Clubs or Organizations:   . Attends Banker Meetings:   Marland Kitchen Marital Status:    Additional Social History:    Allergies:  No Known Allergies  Labs:  Results for orders placed or performed during the hospital encounter of 10/03/19 (from the past 48 hour(s))  Rapid urine drug screen (hospital performed)     Status: None   Collection Time: 10/03/19  4:15 PM  Result Value Ref Range   Opiates NONE DETECTED NONE DETECTED   Cocaine NONE DETECTED NONE DETECTED   Benzodiazepines NONE DETECTED NONE DETECTED   Amphetamines NONE DETECTED NONE DETECTED   Tetrahydrocannabinol NONE DETECTED NONE DETECTED   Barbiturates NONE DETECTED NONE DETECTED    Comment: (NOTE) DRUG SCREEN FOR MEDICAL PURPOSES ONLY.  IF CONFIRMATION IS NEEDED FOR ANY PURPOSE, NOTIFY LAB WITHIN 5 DAYS. LOWEST DETECTABLE LIMITS FOR URINE DRUG SCREEN Drug Class                     Cutoff  (ng/mL) Amphetamine and metabolites    1000 Barbiturate and metabolites    200 Benzodiazepine                 200 Tricyclics and metabolites     300 Opiates and metabolites        300 Cocaine and metabolites        300 THC                            50 Performed at [facility] Lab, [address], [place], [place] [address]   Comprehensive metabolic panel     Status: Abnormal   Collection Time: 10/03/19  4:22 PM  Result Value Ref Range   Sodium 129 (L) 135 - 145 mmol/L   Potassium 4.1 3.5 - 5.1 mmol/L   Chloride 95 (L) 98 - 111 mmol/L   CO2 20 (L) 22 - 32 mmol/L  Glucose, Bld 100 (H) 70 - 99 mg/dL    Comment: Glucose reference range applies only to samples taken after fasting for at least 8 hours.   BUN 6 6 - 20 mg/dL   Creatinine, Ser 7.02 (L) 0.61 - 1.24 mg/dL   Calcium 8.3 (L) 8.9 - 10.3 mg/dL   Total Protein 6.8 6.5 - 8.1 g/dL   Albumin 4.0 3.5 - 5.0 g/dL   AST 637 (H) 15 - 41 U/L   ALT 215 (H) 0 - 44 U/L   Alkaline Phosphatase 68 38 - 126 U/L   Total Bilirubin 0.7 0.3 - 1.2 mg/dL   GFR calc non Af Amer >60 >60 mL/min   GFR calc Af Amer >60 >60 mL/min   Anion gap 14 5 - 15    Comment: Performed at [facility], [address] [place], [place] [address]  Ethanol     Status: Abnormal   Collection Time: 10/03/19  4:22 PM  Result Value Ref Range   Alcohol, Ethyl (B) 401 (HH) <10 mg/dL    Comment: CRITICAL RESULT CALLED TO, READ BACK BY AND VERIFIED WITH: W.PAYAN RN 1800 10/03/19 MCCORMICK K (NOTE) Lowest detectable limit for serum alcohol is 10 mg/dL. For medical purposes only. Performed at [facility], [address] [place], [place] [address]   Salicylate level     Status: Abnormal   Collection Time: 10/03/19  4:22 PM  Result Value Ref Range   Salicylate Lvl <7.0 (L) 7.0 - 30.0 mg/dL    Comment: Performed at [facility], [address], [place], [place] [address]  Acetaminophen level     Status: Abnormal   Collection Time: 10/03/19  4:22  PM  Result Value Ref Range   Acetaminophen (Tylenol), Serum <10 (L) 10 - 30 ug/mL    Comment: (NOTE) Therapeutic concentrations vary significantly. A range of 10-30 ug/mL  may be an effective concentration for many patients. However, some  are best treated at concentrations outside of this range. Acetaminophen concentrations >150 ug/mL at 4 hours after ingestion  and >50 ug/mL at 12 hours after ingestion are often associated with  toxic reactions. Performed at [facility], [address] [place], [place] [address]   cbc     Status: None   Collection Time: 10/03/19  4:22 PM  Result Value Ref Range   WBC 6.0 4.0 - 10.5 K/uL   RBC 4.64 4.22 - 5.81 MIL/uL   Hemoglobin 14.5 13.0 - 17.0 g/dL   HCT 09.4 70.9 - 62.8 %   MCV 94.6 80.0 - 100.0 fL   MCH 31.3 26.0 - 34.0 pg   MCHC 33.0 30.0 - 36.0 g/dL   RDW 36.6 29.4 - 76.5 %   Platelets 211 150 - 400 K/uL   nRBC 0.0 0.0 - 0.2 %    Comment: Performed at [facility], [address] [place], [place] [address]  Troponin I (High Sensitivity)     Status: None   Collection Time: 10/03/19  4:22 PM  Result Value Ref Range   Troponin I (High Sensitivity) 3 <18 ng/L    Comment: (NOTE) Elevated high sensitivity troponin I (hsTnI) values and significant  changes across serial measurements may suggest ACS but many other  chronic and acute conditions are known to elevate hsTnI results.  Refer to the "Links" section for chest pain algorithms and additional  guidance. Performed at [facility], [address], [place], [place] [address]  SARS Coronavirus 2 by RT PCR (hospital order, performed in [facility] hospital lab) Nasopharyngeal Urine, Random     Status: None   Collection Time: 10/03/19  4:34 PM   Specimen: Urine, Random; Nasopharyngeal  Result Value Ref Range   SARS Coronavirus 2 NEGATIVE NEGATIVE    Comment: (NOTE) SARS-CoV-2 target nucleic acids are NOT DETECTED. The SARS-CoV-2 RNA is generally detectable in upper  and lower respiratory specimens during the acute phase of infection. The lowest concentration of SARS-CoV-2 viral copies this assay can detect is 250 copies / mL. A negative result does not preclude SARS-CoV-2 infection and should not be used as the sole basis for treatment or other patient management decisions.  A negative result may occur with improper specimen collection / handling, submission of specimen other than nasopharyngeal swab, presence of viral mutation(s) within the areas targeted by this assay, and inadequate number of viral copies (<250 copies / mL). A negative result must be combined with clinical observations, patient history, and epidemiological information. Fact Sheet for Patients:   BoilerBrush.com.cyhttps://www.fda.gov/media/136312/download Fact Sheet for Healthcare Providers: https://pope.com/https://www.fda.gov/media/136313/download This test is not yet approved or cleared  by the [place] FDA and has been authorized for detection and/or diagnosis of SARS-CoV-2 by FDA under an Emergency Use Authorization (EUA).  This EUA will remain in effect (meaning this test can be used) for the duration of the COVID-19 declaration under Section 564(b)(1) of the Act, 21 U.S.C. section 360bbb-3(b)(1), unless the authorization is terminated or revoked sooner. Performed at [facility], [address] [place], [place] [address]   Troponin I (High Sensitivity)     Status: None   Collection Time: 10/03/19  6:47 PM  Result Value Ref Range   Troponin I (High Sensitivity) 3 <18 ng/L    Comment: (NOTE) Elevated high sensitivity troponin I (hsTnI) values and significant  changes across serial measurements may suggest ACS but many other  chronic and acute conditions are known to elevate hsTnI results.  Refer to the "Links" section for chest pain algorithms and additional  guidance. Performed at [facility], [address] [place], [place] [address]     Medications:  Current  Facility-Administered Medications  Medication Dose Route Frequency Provider Last Rate Last Admin  . LORazepam (ATIVAN) injection 0-4 mg  0-4 mg Intravenous Q6H Benjiman CorePickering, Nathan, MD       Or  . LORazepam (ATIVAN) tablet 0-4 mg  0-4 mg Oral Q6H Benjiman CorePickering, Nathan, MD   1 mg at 10/04/19 2228  . [START ON 10/06/2019] LORazepam (ATIVAN) injection 0-4 mg  0-4 mg Intravenous Orma RenderQ12H Pickering, Nathan, MD       Or  . Melene Muller[START ON 10/06/2019] LORazepam (ATIVAN) tablet 0-4 mg  0-4 mg Oral Q12H Benjiman CorePickering, Nathan, MD      . ondansetron [facility](ZOFRAN) injection 4 mg  4 mg Intravenous Once Benjiman CorePickering, Nathan, MD   Stopped at 10/03/19 2130  . thiamine tablet 100 mg  100 mg Oral Daily Benjiman CorePickering, Nathan, MD   100 mg at 10/04/19 1038   Or  . thiamine (B-1) injection 100 mg  100 mg Intravenous Daily Benjiman CorePickering, Nathan, MD   100 mg at 10/03/19 1959   No current outpatient medications on file.    Musculoskeletal: Strength & Muscle Tone: within normal limits Gait & Station: normal Patient leans: N/A  Psychiatric Specialty Exam: Physical Exam  Nursing note and vitals reviewed. Constitutional: He appears well-developed.  Psychiatric: He has a normal mood and affect. His behavior is normal.  Review of Systems  Blood pressure 113/65, pulse 68, temperature 98.2 F (36.8 C), temperature source Oral, resp. rate 16, height 6\' 1"  (1.854 m), weight 72.6 kg, SpO2 96 %.Body mass index is 21.11 kg/m.  General Appearance: Casual  Eye Contact:  Good  Speech:  Clear and Coherent  Volume:  Normal  Mood:  Euthymic  Affect:  Congruent  Thought Process:  Coherent  Orientation:  Full (Time, Place, and Person)  Thought Content:  Logical  Suicidal Thoughts:  No  Homicidal Thoughts:  No  Memory:  Immediate;   Fair Recent;   Fair  Judgement:  Fair  Insight:  Good  Psychomotor Activity:  Normal  Concentration:  Concentration: Fair  Recall:  of Knowledge:  Fair  Language:  Fair  Akathisia:  No  Handed:  Right  AIMS  (if indicated):     Assets:  Communication Skills Desire for Improvement Social Support  ADL's:  Intact  Cognition:  WNL  Sleep:      NPT Sapphire Tygart spoke to EDP provider [employer] regarding discharge disposition CSW to provide additional outpatient resources   Disposition: No evidence of imminent risk to self or others at present.   Patient does not meet criteria for psychiatric inpatient admission. Supportive therapy provided about ongoing stressors. Refer to IOP. Discussed crisis plan, support from social network, calling 911, coming to the Emergency Department, and calling Suicide Hotline.  This service was provided via telemedicine using a 2-way, interactive audio and video technology.  Names of all persons participating in this telemedicine service and their role in this encounter. Name: [place] Role: patient  Name: T.Mckenleigh Tarlton Role: NP          Tommi Emery, NP 10/05/2019 9:37 AM

## 2019-10-05 NOTE — ED Notes (Signed)
Breakfast Ordered 

## 2019-10-05 NOTE — ED Notes (Signed)
Telepsych completed.  

## 2019-10-05 NOTE — ED Provider Notes (Addendum)
Emergency Medicine Observation Re-evaluation Note  Andrew Rivas is a 59 y.o. male, seen on rounds today.  Pt initially presented to the ED for complaints of Suicidal Currently, the patient is sleeping.  Equal rise and fall of chest  Physical Exam  BP 113/65   Pulse 68   Temp 98.2 F (36.8 C) (Oral)   Resp 16   Ht 6\' 1"  (1.854 m)   Wt 72.6 kg   SpO2 96%   BMI 21.11 kg/m  Physical Exam Vitals and nursing note reviewed.  Constitutional:      General: He is not in acute distress. HENT:     Head: Atraumatic.  Eyes:     General: Vision grossly intact.  Cardiovascular:     Rate and Rhythm: Normal rate and regular rhythm.  Pulmonary:     Effort: Pulmonary effort is normal. No respiratory distress.  Abdominal:     General: There is no distension.     Palpations: Abdomen is soft.  Musculoskeletal:        General: Normal range of motion.     Cervical back: Normal range of motion and neck supple.  Skin:    General: Skin is warm and dry.    ED Course / MDM  EKG:EKG Interpretation  Date/Time:  Friday Oct 03 2019 16:11:15 EDT Ventricular Rate:  80 PR Interval:  142 QRS Duration: 94 QT Interval:  382 QTC Calculation: 440 R Axis:   79 Text Interpretation: Normal sinus rhythm Normal ECG Confirmed by 04-08-1999 [phone]) on 10/03/2019 4:30:04 PM    I have reviewed the labs performed to date as well as medications administered while in observation.  Recent changes in the last 24 hours include served by psychiatry yesterday.  Continues to report SI which psychiatry feels is likely due to substance abuse.  Recommended overnight Obs and plan to reassess today 10/05/2019. Does not appear to be in WD from alcohol at this time. Last ativan 5/15 at 1052 approximately 23 hours ago. Plan  Current plan is for reassessment by psychiatry today 10/05/2019 Patient is not under full IVC at this time.  1015: Patient reassessed by psychiatry.  Patient denies any SI, HI, AVH.  No withdrawal  symptoms.  He appears appropriate.  Psychiatry has cleared patient for discharge.  Will DC home with outpatient resources for psychiatry.  He will return for any new or worsening symptoms.  Patient does not want Librium taper at this time.  The patient has been appropriately medically screened and/or stabilized in the ED. I have low suspicion for any other emergent medical condition which would require further screening, evaluation or treatment in the ED or require inpatient management.  Patient is hemodynamically stable and in no acute distress.  Patient able to ambulate in department prior to ED.  Evaluation does not show acute pathology that would require ongoing or additional emergent interventions while in the emergency department or further inpatient treatment.  I have discussed the diagnosis with the patient and answered all questions.  Pain is been managed while in the emergency department and patient has no further complaints prior to discharge.  Patient is comfortable with plan discussed in room and is stable for discharge at this time.  I have discussed strict return precautions for returning to the emergency department.  Patient was encouraged to follow-up with PCP/specialist refer to at discharge.   Sue Fernicola A, PA-C 10/05/19 0854    Larri Yehle A, PA-C 10/05/19 1017    10/07/19, MD  10/06/19 1506  

## 2019-10-07 ENCOUNTER — Emergency Department (HOSPITAL_COMMUNITY)
Admission: EM | Admit: 2019-10-07 | Discharge: 2019-10-09 | Disposition: A | Discharge status: Home / Self Care | Payer: Self-pay | Attending: Emergency Medicine | Admitting: Emergency Medicine

## 2019-10-07 ENCOUNTER — Other Ambulatory Visit: Payer: Self-pay

## 2019-10-07 ENCOUNTER — Encounter (HOSPITAL_COMMUNITY): Payer: Self-pay

## 2019-10-07 DIAGNOSIS — F339 Major depressive disorder, recurrent, unspecified: Secondary | ICD-10-CM | POA: Diagnosis present

## 2019-10-07 DIAGNOSIS — Z20822 Contact with and (suspected) exposure to covid-19: Secondary | ICD-10-CM | POA: Insufficient documentation

## 2019-10-07 DIAGNOSIS — F1924 Other psychoactive substance dependence with psychoactive substance-induced mood disorder: Secondary | ICD-10-CM | POA: Insufficient documentation

## 2019-10-07 DIAGNOSIS — R45851 Suicidal ideations: Secondary | ICD-10-CM | POA: Insufficient documentation

## 2019-10-07 DIAGNOSIS — F1020 Alcohol dependence, uncomplicated: Secondary | ICD-10-CM | POA: Diagnosis present

## 2019-10-07 DIAGNOSIS — Y908 Blood alcohol level of 240 mg/100 ml or more: Secondary | ICD-10-CM | POA: Insufficient documentation

## 2019-10-07 DIAGNOSIS — R45850 Homicidal ideations: Secondary | ICD-10-CM | POA: Insufficient documentation

## 2019-10-07 DIAGNOSIS — F10920 Alcohol use, unspecified with intoxication, uncomplicated: Secondary | ICD-10-CM

## 2019-10-07 DIAGNOSIS — F1994 Other psychoactive substance use, unspecified with psychoactive substance-induced mood disorder: Secondary | ICD-10-CM | POA: Diagnosis present

## 2019-10-07 LAB — BASIC METABOLIC PANEL
Anion gap: 10 (ref 5–15)
BUN: 6 mg/dL (ref 6–20)
CO2: 23 mmol/L (ref 22–32)
Calcium: 8.5 mg/dL — ABNORMAL LOW (ref 8.9–10.3)
Chloride: 98 mmol/L (ref 98–111)
Creatinine, Ser: 0.69 mg/dL (ref 0.61–1.24)
GFR calc Af Amer: >60 mL/min (ref >60)
GFR calc non Af Amer: >60 mL/min (ref >60)
Glucose, Bld: 91 mg/dL (ref 70–99)
Potassium: 4.3 mmol/L (ref 3.5–5.1)
Sodium: 131 mmol/L — ABNORMAL LOW (ref 135–145)

## 2019-10-07 LAB — CBC WITH DIFFERENTIAL/PLATELET
Abs Immature Granulocytes: 0.03 10*3/uL (ref 0.00–0.07)
Basophils Absolute: 0.1 10*3/uL (ref 0.0–0.1)
Basophils Relative: 1 %
Eosinophils Absolute: 0.2 10*3/uL (ref 0.0–0.5)
Eosinophils Relative: 3 %
HCT: 43.3 % (ref 39.0–52.0)
Hemoglobin: 14.8 g/dL (ref 13.0–17.0)
Immature Granulocytes: 1 %
Lymphocytes Relative: 46 %
Lymphs Abs: 2.9 10*3/uL (ref 0.7–4.0)
MCH: 31.2 pg (ref 26.0–34.0)
MCHC: 34.2 g/dL (ref 30.0–36.0)
MCV: 91.4 fL (ref 80.0–100.0)
Monocytes Absolute: 0.5 10*3/uL (ref 0.1–1.0)
Monocytes Relative: 8 %
Neutro Abs: 2.5 10*3/uL (ref 1.7–7.7)
Neutrophils Relative %: 41 %
Platelets: 292 10*3/uL (ref 150–400)
RBC: 4.74 MIL/uL (ref 4.22–5.81)
RDW: 13.2 % (ref 11.5–15.5)
WBC: 6.2 10*3/uL (ref 4.0–10.5)
nRBC: 0 % (ref 0.0–0.2)

## 2019-10-07 LAB — RAPID URINE DRUG SCREEN, HOSP PERFORMED
Amphetamines: NOT DETECTED
Barbiturates: NOT DETECTED
Benzodiazepines: NOT DETECTED
Cocaine: NOT DETECTED
Opiates: NOT DETECTED
Tetrahydrocannabinol: NOT DETECTED

## 2019-10-07 LAB — ACETAMINOPHEN LEVEL: Acetaminophen (Tylenol), Serum: <10 ug/mL — ABNORMAL LOW (ref 10–30)

## 2019-10-07 LAB — ETHANOL: Alcohol, Ethyl (B): 365 mg/dL (ref <10)

## 2019-10-07 LAB — SALICYLATE LEVEL: Salicylate Lvl: <7 mg/dL — ABNORMAL LOW (ref 7.0–30.0)

## 2019-10-07 MED ORDER — LORAZEPAM 2 MG/ML IJ SOLN: 0.0000 mg | Freq: Two times a day (BID) | INTRAMUSCULAR | Status: DC

## 2019-10-07 MED ORDER — LORAZEPAM 2 MG/ML IJ SOLN: 0.0000 mg | Freq: Four times a day (QID) | INTRAMUSCULAR | Status: DC

## 2019-10-07 MED ORDER — THIAMINE HCL 100 MG PO TABS
100.0000 mg | ORAL_TABLET | Freq: Every day | ORAL | Status: DC
  Administered 2019-10-07 – 2019-10-09 (×3): 100 mg via ORAL
  Filled 2019-10-07 (×3): qty 1

## 2019-10-07 MED ORDER — LORAZEPAM 1 MG PO TABS: 0.0000 mg | ORAL_TABLET | Freq: Two times a day (BID) | ORAL | Status: DC

## 2019-10-07 MED ORDER — THIAMINE HCL 100 MG/ML IJ SOLN: 100.0000 mg | Freq: Every day | INTRAMUSCULAR | Status: DC

## 2019-10-07 MED ORDER — LORAZEPAM 1 MG PO TABS
0.0000 mg | ORAL_TABLET | Freq: Four times a day (QID) | ORAL | Status: DC
  Administered 2019-10-07 – 2019-10-08 (×3): 1 mg via ORAL
  Administered 2019-10-08: 2 mg via ORAL
  Filled 2019-10-07: qty 2
  Filled 2019-10-07 (×3): qty 1

## 2019-10-07 NOTE — ED Provider Notes (Signed)
Care assumed from [facility], PA-C at shift change pending metabolism of alcohol and TTS evaluation. See her note for full HPI.  In short, patient is a 59 year old male with a past medical history significant for alcohol abuse, depression who presents to the ED due to homicidal ideations.  Per [employer] patient severely intoxicated and observed walking down the street wanting to "kill someone" patient also endorses SI.  10:56 PM reassessed patient at bedside.  He is resting comfortably in bed.  TTS evaluated patient at 9 PM who notes that patient is too intoxicated for further evaluation.  We will continue to allow the tablets of alcohol prior to reconsulting TTS.  Patient handed off to [employer], PA-C at shift change pending metabolism of alcohol and TTS evaluation. Patient has po fluids at bedside.      Jesusita Oka 10/07/19 2318    Lorre Nick, MD 10/09/19 1158

## 2019-10-07 NOTE — [facility] Counselor (Signed)
Per chart pt's BAL was 365 at 2014. Clinician spoke to [facility] [place] to express pt will be assessed once he's medically cleared (BAL too high). Kandee Keen, RN to tell Maralyn Sago, Charity fundraiser.    Redmond Pulling, MS, [facility], [facility] Triage Specialist [phone].

## 2019-10-07 NOTE — ED Notes (Signed)
Date and time results received: 10/07/19 8:14 PM   Test: ETOH  Critical Value:365  Name of Provider Notified: courtni, pa
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
## 2019-10-07 NOTE — ED Triage Notes (Signed)
Pt arrives via police. Per Police: Pt is severely intoxicated and homeless. Police was called due to pt walking in the street. When approached pt states "Take to me to [facility]" "I want to kill someone" Pt additionally endorses SI however pt is not oriented to surroundings at this time due to ETOH.

## 2019-10-08 ENCOUNTER — Encounter (HOSPITAL_COMMUNITY): Payer: Self-pay | Admitting: Registered Nurse

## 2019-10-08 LAB — SARS CORONAVIRUS 2 BY RT PCR (HOSPITAL ORDER, PERFORMED IN ~~LOC~~ HOSPITAL LAB): SARS Coronavirus 2: NEGATIVE

## 2019-10-08 MED ORDER — GABAPENTIN 300 MG PO CAPS
300.0000 mg | ORAL_CAPSULE | Freq: Three times a day (TID) | ORAL | Status: DC
  Administered 2019-10-08 – 2019-10-09 (×3): 300 mg via ORAL
  Filled 2019-10-08 (×3): qty 1

## 2019-10-08 MED ORDER — MIRTAZAPINE 7.5 MG PO TABS
7.5000 mg | ORAL_TABLET | Freq: Every day | ORAL | Status: DC
  Administered 2019-10-08: 7.5 mg via ORAL
  Filled 2019-10-08: qty 1

## 2019-10-08 MED ORDER — FLUOXETINE HCL 20 MG PO CAPS
20.0000 mg | ORAL_CAPSULE | Freq: Every day | ORAL | Status: DC
  Administered 2019-10-08 – 2019-10-09 (×2): 20 mg via ORAL
  Filled 2019-10-08 (×2): qty 1

## 2019-10-08 NOTE — ED Notes (Signed)
TTS assessment complete. 

## 2019-10-08 NOTE — ED Notes (Signed)
Spoke with TTS at [facility] and made aware patient ate breakfast and is sitting up in bed ready to talk. Tele psych machine at bedside.

## 2019-10-08 NOTE — H&P (Signed)
BH Observation Unit Provider Admission PAA/H&P  Patient Identification: Andrew Rivas MRN:  [id number] Date of Evaluation:  10/08/2019 Chief Complaint:  ETOH, HI Principal Diagnosis: Substance induced mood disorder (HCC) Diagnosis:  Principal Problem:   Substance induced mood disorder (HCC) Active Problems:   Alcohol dependence (HCC)   Major depressive disorder, recurrent episode (HCC)  History of Present Illness: Andrew Rivas, 59 y.o., male patient seen via tele psych by this provider, Dr. Lucianne Muss; and chart reviewed on 10/08/19.  On evaluation Andrew Rivas reports he came to the hospital after trying to kill himself via overdose.  Patient states that he has a lot of problems and doesn't want to live anymore.  States that he lives in [place] with a roommate who is supportive.  States one of stressors is having a hard time paying his bills. Patient states he was taking medications but not currently.  States that he was prescribed medications at his last psychiatric hospitalization and took them until they ran out and did not follow up with any resources that were given for outpatient psychiatric services.  Patient states that he would like to get back on medications and get his life together.  Admits to polysubstance use and alcohol use disorder.   During evaluation Andrew Rivas is alert/oriented x 4; calm/cooperative; and mood is congruent with affect.  He does not appear to be responding to internal/external stimuli or delusional thoughts.  Patient denies suicidal/self-harm/homicidal ideation, psychosis, and paranoia.  Patient answered question appropriately.    Associated Signs/Symptoms: Depression Symptoms:  depressed mood, insomnia, suicidal attempt, anxiety, (Hypo) Manic Symptoms:  Irritable Mood, Anxiety Symptoms:  Excessive Worry, Psychotic Symptoms:  Denies PTSD Symptoms: NA Total Time spent with patient: 30 minutes  Past Psychiatric History: Depression, anxiety,  polysubstance abuse  Is the patient at risk to self? Yes.    Has the patient been a risk to self in the past 6 months? Yes.    Has the patient been a risk to self within the distant past? Yes.    Is the patient a risk to others? No.  Has the patient been a risk to others in the past 6 months? No.  Has the patient been a risk to others within the distant past? No.   Prior Inpatient Therapy:  No Prior Outpatient Therapy:  No  Alcohol Screening:   Substance Abuse History in the last 12 months:  Yes.   Consequences of Substance Abuse: Family Consequences:  Famy discord Withdrawal Symptoms:   Diaphoresis Diarrhea Headaches Nausea Tremors Previous Psychotropic Medications: Yes  Psychological Evaluations: Yes  Past Medical History:  Past Medical History:  Diagnosis Date  . Alcohol abuse   . Depression   . Medical history non-contributory     Past Surgical History:  Procedure Laterality Date  . HAND SURGERY Left   . NO PAST SURGERIES     Family History: History reviewed. No pertinent family history. Family Psychiatric History: Unaware Tobacco Screening:   Social History:  Social History   Substance and Sexual Activity  Alcohol Use Yes   Comment: daily      Social History   Substance and Sexual Activity  Drug Use Yes  . Types: Marijuana, Oxycodone    Additional Social History:                           Allergies:  No Known Allergies Lab Results:  Results for orders placed or performed during the  hospital encounter of 10/07/19 (from the past 48 hour(s))  CBC with Differential     Status: None   Collection Time: 10/07/19  7:26 PM  Result Value Ref Range   WBC 6.2 4.0 - 10.5 K/uL   RBC 4.74 4.22 - 5.81 MIL/uL   Hemoglobin 14.8 13.0 - 17.0 g/dL   HCT 42.6 83.4 - 19.6 %   MCV 91.4 80.0 - 100.0 fL   MCH 31.2 26.0 - 34.0 pg   MCHC 34.2 30.0 - 36.0 g/dL   RDW 22.2 97.9 - 89.2 %   Platelets 292 150 - 400 K/uL   nRBC 0.0 0.0 - 0.2 %   Neutrophils Relative  % 41 %   Neutro Abs 2.5 1.7 - 7.7 K/uL   Lymphocytes Relative 46 %   Lymphs Abs 2.9 0.7 - 4.0 K/uL   Monocytes Relative 8 %   Monocytes Absolute 0.5 0.1 - 1.0 K/uL   Eosinophils Relative 3 %   Eosinophils Absolute 0.2 0.0 - 0.5 K/uL   Basophils Relative 1 %   Basophils Absolute 0.1 0.0 - 0.1 K/uL   Immature Granulocytes 1 %   Abs Immature Granulocytes 0.03 0.00 - 0.07 K/uL    Comment: Performed at [facility], [address], [place], [place] [address]  Basic metabolic panel     Status: Abnormal   Collection Time: 10/07/19  7:26 PM  Result Value Ref Range   Sodium 131 (L) 135 - 145 mmol/L   Potassium 4.3 3.5 - 5.1 mmol/L   Chloride 98 98 - 111 mmol/L   CO2 23 22 - 32 mmol/L   Glucose, Bld 91 70 - 99 mg/dL    Comment: Glucose reference range applies only to samples taken after fasting for at least 8 hours.   BUN 6 6 - 20 mg/dL   Creatinine, Ser 7.40 0.61 - 1.24 mg/dL   Calcium 8.5 (L) 8.9 - 10.3 mg/dL   GFR calc non Af Amer >60 >60 mL/min   GFR calc Af Amer >60 >60 mL/min   Anion gap 10 5 - 15    Comment: Performed at [facility], [address], [place], [place] [address]  Ethanol     Status: Abnormal   Collection Time: 10/07/19  7:26 PM  Result Value Ref Range   Alcohol, Ethyl (B) 365 (HH) <10 mg/dL    Comment: CRITICAL RESULT CALLED TO, READ BACK BY AND VERIFIED WITH: S.FARRAR AT 2014 ON 10/07/19 BY N.THOMPSON (NOTE) Lowest detectable limit for serum alcohol is 10 mg/dL. For medical purposes only. Performed at [facility], [address] [place], [place] [address]   Salicylate level     Status: Abnormal   Collection Time: 10/07/19  7:26 PM  Result Value Ref Range   Salicylate Lvl <7.0 (L) 7.0 - 30.0 mg/dL    Comment: Performed at [facility], [address], [place], [place] [address]  Acetaminophen level     Status: Abnormal   Collection Time: 10/07/19  7:26 PM  Result Value Ref Range    Acetaminophen (Tylenol), Serum <10 (L) 10 - 30 ug/mL    Comment: (NOTE) Therapeutic concentrations vary significantly. A range of 10-30 ug/mL  may be an effective concentration for many patients. However, some  are best treated at concentrations outside of this range. Acetaminophen concentrations >150 ug/mL at 4 hours after ingestion  and >50 ug/mL at 12 hours after ingestion are often associated with  toxic reactions. Performed at  [facility], [address], [place], [place] [address]   Rapid urine drug screen (hospital performed)     Status: None   Collection Time: 10/07/19  7:34 PM  Result Value Ref Range   Opiates NONE DETECTED NONE DETECTED   Cocaine NONE DETECTED NONE DETECTED   Benzodiazepines NONE DETECTED NONE DETECTED   Amphetamines NONE DETECTED NONE DETECTED   Tetrahydrocannabinol NONE DETECTED NONE DETECTED   Barbiturates NONE DETECTED NONE DETECTED    Comment: (NOTE) DRUG SCREEN FOR MEDICAL PURPOSES ONLY.  IF CONFIRMATION IS NEEDED FOR ANY PURPOSE, NOTIFY LAB WITHIN 5 DAYS. LOWEST DETECTABLE LIMITS FOR URINE DRUG SCREEN Drug Class                     Cutoff (ng/mL) Amphetamine and metabolites    1000 Barbiturate and metabolites    200 Benzodiazepine                 200 Tricyclics and metabolites     300 Opiates and metabolites        300 Cocaine and metabolites        300 THC                            50 Performed at [facility], [address] [place], [place] [address]     Blood Alcohol level:  Lab Results  Component Value Date   ETH 365 [facility]) 10/07/2019   ETH 401 (HH) 10/03/2019    Metabolic Disorder Labs:  Lab Results  Component Value Date   HGBA1C 5.6 01/23/2019   MPG 114.02 01/23/2019   No results found for: PROLACTIN Lab Results  Component Value Date   CHOL 205 (H) 01/23/2019   TRIG 40 01/23/2019   HDL 119 01/23/2019   CHOLHDL 1.7 01/23/2019   VLDL 8 01/23/2019   LDLCALC 78 01/23/2019     Current Medications: Current Facility-Administered Medications  Medication Dose Route Frequency Provider Last Rate Last Admin  . FLUoxetine (PROZAC) capsule 20 mg  20 mg Oral Daily Caryn Gienger B, NP      . gabapentin (NEURONTIN) tablet 300 mg  300 mg Oral TID Takiera Mayo B, NP      . LORazepam (ATIVAN) injection 0-4 mg  0-4 mg Intravenous Q6H Couture, Cortni S, PA-C       Or  . LORazepam (ATIVAN) tablet 0-4 mg  0-4 mg Oral Q6H Couture, Cortni S, PA-C   2 mg at 10/08/19 1436  . [START ON 10/10/2019] LORazepam (ATIVAN) injection 0-4 mg  0-4 mg Intravenous Q12H Couture, Cortni S, PA-C       Or  . [START ON 10/10/2019] LORazepam (ATIVAN) tablet 0-4 mg  0-4 mg Oral Q12H Couture, Cortni S, PA-C      . mirtazapine (REMERON) tablet 7.5 mg  7.5 mg Oral QHS Marializ Ferrebee B, NP      . thiamine tablet 100 mg  100 mg Oral Daily Couture, Cortni S, PA-C   100 mg at 10/08/19 1050   Or  . thiamine (B-1) injection 100 mg  100 mg Intravenous Daily Couture, Cortni S, PA-C       No current outpatient medications on file.   PTA Medications: (Not in a hospital admission)   Musculoskeletal: Strength & Muscle Tone: within normal limits Gait & Station: normal Patient leans: N/A  Psychiatric Specialty Exam: Physical Exam  Nursing note and vitals reviewed. Constitutional: He is  oriented to person, place, and time. Respiratory: Effort normal. Neurological: He is alert and oriented to person, place, and time.  Psychiatric: His speech is normal and behavior is normal. His mood appears anxious. Thought content is not paranoid and not delusional. Cognition and memory are normal. He expresses impulsivity. He exhibits a depressed mood. He expresses suicidal ideation. He expresses no homicidal ideation. He expresses suicidal plans.   Review of Systems  Psychiatric/Behavioral: Positive for suicidal ideas. Negative for hallucinations. The patient is nervous/anxious.        Patient stating that he took an  overdose of OxyContin in a suicide attempt.  States he got the OxyContin from a friend and took 8 tablets.  Patient unable to contract for safety  ____ All other systems reviewed and are negative.__  Blood pressure 115/74, pulse 82, temperature (!) 97.5 F (36.4 C), temperature source Axillary, resp. rate 18, SpO2 96 %.There is no height or weight on file to calculate BMI.  General Appearance: Casual  Eye Contact:  Good  Speech:  Clear and Coherent and Normal Rate  Volume:  Normal  Mood:  Anxious and Depressed  Affect:  Congruent  Thought Process:  Coherent, Goal Directed and Descriptions of Associations: Intact  Orientation:  Full (Time, Place, and Person)  Thought Content:  Logical  Suicidal Thoughts:  Yes.  with intent/plan  Homicidal Thoughts:  No  Memory:  Immediate;   Good Recent;   Good  Judgement:  Impaired  Insight:  Lacking  Psychomotor Activity:  Normal  Concentration:  Concentration: Good and Attention Span: Good  Recall:  Good  Fund of Knowledge:  Fair  Language:  Good  Akathisia:  No  Handed:  Right  AIMS (if indicated):     Assets:  Communication Skills Desire for Improvement Housing Social Support  ADL's:  Intact  Cognition:  WNL  Sleep:         Treatment Plan Summary: Medication management and Plan [facility] Observation when bed available  Observation Level/Precautions:  15 minute checks Laboratory:  CBC Chemistry Profile UDS UA Psychotherapy:   Individual Medications:  Restart Prozac, Gabapentin, and Trazodone Consultations:  As needed Discharge Concerns:  Safety Estimated LOS:  Overnight observation and reassess tomorrow Other:      Sequoyah Counterman, NP 5/19/20213:14 PM

## 2019-10-08 NOTE — ED Notes (Signed)
Spoke to TTS. Please call when patient is awake and able to talk.

## 2019-10-08 NOTE — BHH Counselor (Signed)
Attempted assessment, spoke with attending RN Whitney Post.  Per Whitney Post, Pt was still groggy, declined assessment at this time.  RN agreed to call when Pt was ready to be seen.

## 2019-10-08 NOTE — BHH Counselor (Signed)
Per Nurse, pt not able to participate at this time for assessment for TTS. Pt to be assessed when he awakes, nurse Sarah to call TTS back to let TTS know.

## 2019-10-08 NOTE — ED Provider Notes (Addendum)
Care assumed from C. Aberman PA-C at shift change pending sobriety and TTS evaluation for his suicidal ideations.  See her note for full H&P.   Patient had ethanol of 365 and was not medically clear to participate in TTS at 20:58 when [facility] tried. TTS was attempted again at 4:05 AM however patient was sleeping and per RN note not able to participate in evaluation. TTS evaluation will happen after shift change per RN. Patient sleeping but arouses to voice and participates in exam.  Will sign out to oncoming PA K. Ford to follow up on [facility] recommendations once TTS is performed.    Portions of this note were generated with Scientist, clinical (histocompatibility and immunogenetics). Dictation errors may occur despite best attempts at proofreading.        Sherene Sires, PA-C 10/08/19 0643    Sherene Sires, PA-C 10/08/19 8372    Glynn Octave, MD 10/08/19 [phone]

## 2019-10-08 NOTE — BH Assessment (Signed)
BHH Assessment Progress Note  Per Shuvon Rankin, FNP, pt is to be observed overnight.  Jasmine reports that no observation beds are currently available.  The charge nurse has been notified.  Doylene Canning, [place] Behavioral Health Coordinator [phone]

## 2019-10-08 NOTE — ED Notes (Signed)
Meal tray given 

## 2019-10-08 NOTE — ED Provider Notes (Signed)
Care assumed from PA [place] Albrizze at shift change pending sobriety and TTS evaluation for suicidal ideations.  Patient initially seen by provider could her PAC, please see their note for full H&P.  Patient with ethanol level of 365, not able to participate in TTS initially, TTS attempted at approximately 7 AM now more awake, but initially declined TTS evaluation, will provide patient breakfast and TTS will come by to reevaluate him later this morning.  Patient has now sobered up, is alert, TTS consult completed and they recommend observation at behavioral health, Covid test ordered.  As long as Covid is negative patient is medically cleared to go to behavioral health.   Dartha Lodge, PA-C 10/08/19 1410    Alvira Monday, MD 10/08/19 2215

## 2019-10-09 MED ORDER — FLUOXETINE HCL 20 MG PO CAPS: 20.0000 mg | ORAL_CAPSULE | Freq: Every day | ORAL | 0 refills | Status: DC

## 2019-10-09 MED ORDER — GABAPENTIN 300 MG PO CAPS: 300.0000 mg | ORAL_CAPSULE | Freq: Three times a day (TID) | ORAL | 0 refills | Status: DC

## 2019-10-09 MED ORDER — MIRTAZAPINE 7.5 MG PO TABS: 7.5000 mg | ORAL_TABLET | Freq: Every day | ORAL | 0 refills | Status: DC

## 2019-10-09 NOTE — Consult Note (Signed)
[facility] Psych ED Discharge  10/09/2019 1:47 PM Andrew Rivas  MRN:  [id number] Principal Problem: Substance induced mood disorder [facility]) Discharge Diagnoses: Principal Problem:   Substance induced mood disorder ([place]) Active Problems:   Alcohol dependence ([place])   Major depressive disorder, recurrent episode ([place])   Subjective: " I can keep myself safe as long as I have my medications."  Patient seen by nurse practitioner along with Dr. Dwyane Dee.  Patient reports recently feeling depressed related to alcohol use for approximately 3 months.  Patient recently relapsed on alcohol. Patient denies suicidal ideations today.  Patient denies homicidal ideations.  Patient denies auditory visual hallucinations.  Patient denies symptoms of paranoia. Patient reports he lives in [place] with a roommate.  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  Patient denies access to weapons.  Patient endorses use of alcohol.  Patient denies use of substance aside from alcohol.  Patient expresses he would like to stop using alcohol and plans to follow-up with substance use resources provided by peers support specialist.  Total Time spent with patient: 20 minutes  Past Psychiatric History: Substance induced mood disorder, alcohol abuse, MDD  Past Medical History:  Past Medical History:  Diagnosis Date  . Alcohol abuse   . Depression   . Medical history non-contributory     Past Surgical History:  Procedure Laterality Date  . HAND SURGERY Left   . NO PAST SURGERIES     Family History: History reviewed. No pertinent family history. Family Psychiatric  History: Unknown Social History:  Social History   Substance and Sexual Activity  Alcohol Use Yes   Comment: daily      Social History   Substance and Sexual Activity  Drug Use Yes  . Types: Marijuana, Oxycodone    Social History   Socioeconomic History  . Marital status: Single    Spouse name: Not on file  . Number of children:  Not on file  . Years of education: Not on file  . Highest education level: Not on file  Occupational History  . Not on file  Tobacco Use  . Smoking status: Current Every Day Smoker    Packs/day: 1.00  . Smokeless tobacco: Never Used  Substance and Sexual Activity  . Alcohol use: Yes    Comment: daily   . Drug use: Yes    Types: Marijuana, Oxycodone  . Sexual activity: Not Currently    Birth control/protection: None  Other Topics Concern  . Not on file  Social History Narrative   ** Merged History Encounter **       Social Determinants of Health   Financial Resource Strain:   . Difficulty of Paying Living Expenses:   Food Insecurity:   . Worried About Charity fundraiser in the Last Year:   . Arboriculturist in the Last Year:   Transportation Needs:   . Film/video editor (Medical):   Marland Kitchen Lack of Transportation (Non-Medical):   Physical Activity:   . Days of Exercise per Week:   . Minutes of Exercise per Session:   Stress:   . Feeling of Stress :   Social Connections:   . Frequency of Communication with Friends and Family:   . Frequency of Social Gatherings with Friends and Family:   . Attends Religious Services:   . Active Member of Clubs or Organizations:   . Attends Archivist Meetings:   Marland Kitchen Marital Status:     Has this patient used  any form of tobacco in the last 30 days? (Cigarettes, Smokeless Tobacco, Cigars, and/or Pipes) A prescription for an FDA-approved tobacco cessation medication was offered at discharge and the patient refused  Current Medications: Current Facility-Administered Medications  Medication Dose Route Frequency Provider Last Rate Last Admin  . FLUoxetine (PROZAC) capsule 20 mg  20 mg Oral Daily Rankin, Shuvon B, NP   20 mg at 10/09/19 1058  . gabapentin (NEURONTIN) capsule 300 mg  300 mg Oral TID Rankin, Shuvon B, NP   300 mg at 10/09/19 1058  . LORazepam (ATIVAN) injection 0-4 mg  0-4 mg Intravenous Q6H Couture, Cortni S, PA-C        Or  . LORazepam (ATIVAN) tablet 0-4 mg  0-4 mg Oral Q6H Couture, Cortni S, PA-C   1 mg at 10/08/19 2027  . [START ON 10/10/2019] LORazepam (ATIVAN) injection 0-4 mg  0-4 mg Intravenous Q12H Couture, Cortni S, PA-C       Or  . [START ON 10/10/2019] LORazepam (ATIVAN) tablet 0-4 mg  0-4 mg Oral Q12H Couture, Cortni S, PA-C      . mirtazapine (REMERON) tablet 7.5 mg  7.5 mg Oral QHS Rankin, Shuvon B, NP   7.5 mg at 10/08/19 2157  . thiamine tablet 100 mg  100 mg Oral Daily Couture, Cortni S, PA-C   100 mg at 10/09/19 1058   Or  . thiamine (B-1) injection 100 mg  100 mg Intravenous Daily Couture, Cortni S, PA-C       Current Outpatient Medications  Medication Sig Dispense Refill  . [START ON 10/10/2019] FLUoxetine (PROZAC) 20 MG capsule Take 1 capsule (20 mg total) by mouth daily. 30 capsule 0  . gabapentin (NEURONTIN) 300 MG capsule Take 1 capsule (300 mg total) by mouth 3 (three) times daily. 90 capsule 0  . mirtazapine (REMERON) 7.5 MG tablet Take 1 tablet (7.5 mg total) by mouth at bedtime. 30 tablet 0   PTA Medications: (Not in a hospital admission)   Musculoskeletal: Strength & Muscle Tone: within normal limits Gait & Station: normal Patient leans: N/A  Psychiatric Specialty Exam: Physical Exam Vitals and nursing note reviewed.  Constitutional:      Appearance: He is well-developed.  HENT:     Head: Normocephalic.  Cardiovascular:     Rate and Rhythm: Normal rate.  Pulmonary:     Effort: Pulmonary effort is normal.  Neurological:     Mental Status: He is alert and oriented to person, place, and time.  Psychiatric:        Mood and Affect: Mood normal.        Behavior: Behavior normal.        Thought Content: Thought content normal.        Judgment: Judgment normal.     Review of Systems  Constitutional: Negative.   HENT: Negative.   Eyes: Negative.   Respiratory: Negative.   Cardiovascular: Negative.   Gastrointestinal: Negative.   Genitourinary: Negative.    Musculoskeletal: Negative.   Skin: Negative.   Neurological: Negative.   Psychiatric/Behavioral: Negative.     Blood pressure 118/83, pulse 84, temperature (!) 97.5 F (36.4 C), temperature source Axillary, resp. rate 18, SpO2 99 %.There is no height or weight on file to calculate BMI.  General Appearance: Casual and Fairly Groomed  Eye Contact:  Good  Speech:  Clear and Coherent and Normal Rate  Volume:  Normal  Mood:  Euthymic  Affect:  Appropriate and Congruent  Thought Process:  Coherent, Goal Directed  and Descriptions of Associations: Intact  Orientation:  Full (Time, Place, and Person)  Thought Content:  WDL and Logical  Suicidal Thoughts:  No  Homicidal Thoughts:  No  Memory:  Immediate;   Good Recent;   Good Remote;   Good  Judgement:  Fair  Insight:  Good  Psychomotor Activity:  Normal  Concentration:  Concentration: Good and Attention Span: Good  Recall:  Good  Fund of Knowledge:  Good  Language:  Good  Akathisia:  No  Handed:  Right  AIMS (if indicated):     Assets:  Communication Skills Desire for Improvement Financial Resources/Insurance Housing Intimacy Leisure Time Physical Health Resilience Social Support Talents/Skills  ADL's:  Intact  Cognition:  WNL  Sleep:        Demographic Factors:  Male  Loss Factors: NA  Historical Factors: NA  Risk Reduction Factors:   Employed, Living with another person, especially a relative, Positive social support, Positive therapeutic relationship and Positive coping skills or problem solving skills  Continued Clinical Symptoms:  Alcohol/Substance Abuse/Dependencies  Cognitive Features That Contribute To Risk:  None    Suicide Risk:  Minimal: No identifiable suicidal ideation.  Patients presenting with no risk factors but with morbid ruminations; may be classified as minimal risk based on the severity of the depressive symptoms    Plan Of Care/Follow-up recommendations:  Follow-up with outpatient  psychiatry as well as substance use treatment resources provided by peers support specialist.  Disposition: Discharge Patrcia Dolly, FNP 10/09/2019, 1:47 PM

## 2019-10-09 NOTE — Discharge Instructions (Signed)
To help you maintain a sober lifestyle, a substance abuse treatment program may be beneficial to you.  Contact one of the following facilities at your earliest opportunity to ask about enrolling:  RESIDENTIAL PROGRAMS:       [facility]      [address]      [place], [place] [address]      [phone]       [facility]      [address]      [place], [place] [address]      [phone]       [facility]      [address]      [place], [place] [address]      [phone]  OUTPATIENT PROGRAMS:       [facility]      [address]      [place], [place] [address]      [phone]       New patients are seen at their walk-in clinic.  Walk-in hours are Monday - Friday from 8:30 am - 12:00 pm, and from 1:00 pm - 2:30 pm.  Walk-in patients are seen on a first come, first served basis, so try to arrive as early as possible for the best chance of being seen the same day. 

## 2019-10-09 NOTE — BH Assessment (Signed)
[facility] Assessment Progress Note  Per Berneice Heinrich, FNP, this pt does not require psychiatric hospitalization at this time.  Pt is to be discharged from [facility] with referral information for area substance abuse treatment providers.  This has been included in pt's discharge instructions.  Pt's nurse has been notified.  Doylene Canning, MA Triage Specialist [phone]

## 2019-10-09 NOTE — Patient Outreach (Signed)
CPSS met with Pt an was able to use motivational interviewing to better assist Pt. CPSS was made aware that Pt was feeling better from the medications that he had received. Pt also stated that he has resources an contact information, for when an if he feels that he needs help in the community. CPSS left contact information for Pt to use for community service.

## 2019-10-09 NOTE — ED Notes (Signed)
Pt resting comfortably on stretcher, eating meal at this time. Peer support at bedside.

## 2019-10-19 ENCOUNTER — Emergency Department (HOSPITAL_COMMUNITY)
Admission: EM | Admit: 2019-10-19 | Discharge: 2019-10-20 | Disposition: A | Discharge status: Home / Self Care | Payer: Self-pay | Attending: Emergency Medicine | Admitting: Emergency Medicine

## 2019-10-19 ENCOUNTER — Other Ambulatory Visit: Payer: Self-pay

## 2019-10-19 ENCOUNTER — Encounter (HOSPITAL_COMMUNITY): Payer: Self-pay | Admitting: Emergency Medicine

## 2019-10-19 ENCOUNTER — Emergency Department (HOSPITAL_COMMUNITY): Payer: Self-pay

## 2019-10-19 DIAGNOSIS — F10920 Alcohol use, unspecified with intoxication, uncomplicated: Secondary | ICD-10-CM | POA: Insufficient documentation

## 2019-10-19 DIAGNOSIS — F17210 Nicotine dependence, cigarettes, uncomplicated: Secondary | ICD-10-CM | POA: Insufficient documentation

## 2019-10-19 DIAGNOSIS — R0789 Other chest pain: Secondary | ICD-10-CM | POA: Insufficient documentation

## 2019-10-19 DIAGNOSIS — R0602 Shortness of breath: Secondary | ICD-10-CM | POA: Insufficient documentation

## 2019-10-19 DIAGNOSIS — R112 Nausea with vomiting, unspecified: Secondary | ICD-10-CM | POA: Insufficient documentation

## 2019-10-19 DIAGNOSIS — Y908 Blood alcohol level of 240 mg/100 ml or more: Secondary | ICD-10-CM | POA: Insufficient documentation

## 2019-10-19 LAB — BASIC METABOLIC PANEL
Anion gap: 13 (ref 5–15)
BUN: <5 mg/dL — ABNORMAL LOW (ref 6–20)
CO2: 27 mmol/L (ref 22–32)
Calcium: 8.7 mg/dL — ABNORMAL LOW (ref 8.9–10.3)
Chloride: 96 mmol/L — ABNORMAL LOW (ref 98–111)
Creatinine, Ser: 0.65 mg/dL (ref 0.61–1.24)
GFR calc Af Amer: >60 mL/min (ref >60)
GFR calc non Af Amer: >60 mL/min (ref >60)
Glucose, Bld: 89 mg/dL (ref 70–99)
Potassium: 3.9 mmol/L (ref 3.5–5.1)
Sodium: 136 mmol/L (ref 135–145)

## 2019-10-19 LAB — CBC
HCT: 42.9 % (ref 39.0–52.0)
Hemoglobin: 14.6 g/dL (ref 13.0–17.0)
MCH: 31.1 pg (ref 26.0–34.0)
MCHC: 34 g/dL (ref 30.0–36.0)
MCV: 91.3 fL (ref 80.0–100.0)
Platelets: 267 10*3/uL (ref 150–400)
RBC: 4.7 MIL/uL (ref 4.22–5.81)
RDW: 13.4 % (ref 11.5–15.5)
WBC: 6.7 10*3/uL (ref 4.0–10.5)
nRBC: 0 % (ref 0.0–0.2)

## 2019-10-19 LAB — TROPONIN I (HIGH SENSITIVITY)
Troponin I (High Sensitivity): <2 ng/L (ref <18)
Troponin I (High Sensitivity): <2 ng/L (ref <18)

## 2019-10-19 LAB — ETHANOL: Alcohol, Ethyl (B): 324 mg/dL (ref <10)

## 2019-10-19 LAB — CBG MONITORING, ED: Glucose-Capillary: 87 mg/dL (ref 70–99)

## 2019-10-19 MED ORDER — ACETAMINOPHEN ER 650 MG PO TBCR: 650.0000 mg | EXTENDED_RELEASE_TABLET | Freq: Three times a day (TID) | ORAL | 0 refills | Status: AC | PRN

## 2019-10-19 MED ORDER — SODIUM CHLORIDE 0.9 % IV BOLUS
1000.0000 mL | Freq: Once | INTRAVENOUS | Status: AC
  Administered 2019-10-19: 1000 mL via INTRAVENOUS

## 2019-10-19 MED ORDER — SODIUM CHLORIDE 0.9% FLUSH: 3.0000 mL | Freq: Once | INTRAVENOUS | Status: DC

## 2019-10-19 NOTE — ED Triage Notes (Signed)
Pt to triage via [facility].  C/o chest pain x 2-3 weeks.  Per EMS, pt's family recently left him and he has heavy ETOH use.  EMS administered 2 NTG and pain decreased from 10 to 3.  BP 180/100 down to 118/78.  CBG 117.  Pt reports SOB, nausea, and vomiting.

## 2019-10-19 NOTE — Discharge Instructions (Addendum)
As discussed, all of your labs were reassuring today. Your cardiac labs were normal. I suspect your chest pain is related to musculoskeletal pain. I am sending you home with tylenol for chest pain, but avoid taking this if you drink alcohol. Take as directed. .   Follow-up with your primary care provider if symptoms do not improve within the next week. Return to the ER for new or worsening symptoms. If you do not have a primary care provider, clall the number on your discharge paperwork to get associated with one.
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
## 2019-10-20 NOTE — ED Notes (Signed)
Pt verbalized understanding of d/c instructions, medications and followup. Pt transported to [facility] via WC. NAD

## 2019-10-20 NOTE — ED Provider Notes (Signed)
59 year old male received a signout from [place] pending metabolization of ethanol.  Per her HPI:  "Andrew Rivas is a 59 y.o. male with a past medical history significant for alcohol abuse and depression who presents to the ED due to intermittent episodes of central/left-sided chest pain that has been ongoing for the past 2 to 3 weeks.  Patient describes the pain as a sharp sensation that lasted a few minutes and then resolves.  Patient notes chest pain occurs both at rest and with exertion.  Chest pain is associated with shortness of breath, nausea, vomiting and feeling "overheated".  Chest pain is nonradiating.  Denies recent illness.  Denies worsening with change in position.  He admits to smoking 1 pack of cigarettes daily.  Admits to having a family history of early CAD.  Denies previous chest pain episodes.  He does not have a cardiologist.  He also admits to heavy alcohol consumption.  He had 3-4 40 ounce beers today and admits to drinking roughly that much daily. Patient was given 2 nitros via EMS which improved his pain. No aggravating or alleviating factors. Denies URI symptoms.  Denies lower extremity edema.  Denies history of blood clots, recent surgeries, recent long immobilizations, and hormonal treatments. Patient is currently chest pain free.   History obtained from patient and past medical records. No interpreter used during encounter."   Physical Exam  BP 123/86 (BP Location: Right Arm)   Pulse 92   Temp 98.1 F (36.7 C) (Oral)   Resp 16   SpO2 97%   Physical Exam Vitals and nursing note reviewed.  Constitutional:      Appearance: He is well-developed.     Comments: Resting comfortably in bed.  No acute distress.  HENT:     Head: Normocephalic and atraumatic.  Pulmonary:     Effort: Pulmonary effort is normal.  Musculoskeletal:     Cervical back: Neck supple.  Neurological:     Mental Status: He is alert and oriented to person, place, and time.     Cranial Nerves:  No cranial nerve deficit.     Comments: Speaks in complete, fluent sentences without slurred speech.  Psychiatric:        Behavior: Behavior normal.     ED Course/Procedures   Clinical Course as of Oct 19 929  Sun Oct 19, 2019  2159 Alcohol, Ethyl (B)(!!): 324 [CA]    Clinical Course User Index [CA] Mannie Stabile, PA-C    Procedures  MDM   59 year old male received at signout from [place].  Please see her note for further work-up and medical decision making.  In brief, this is a 59 year old male with a history of alcohol use disorder and depression presenting with reproducible left-sided chest pain for the last 2 to 3 weeks.  Patient was noted to appear clinically intoxicated on exam and ethanol level obtained, which was 324.  He has previously been medically cleared by PA Aberman regarding his chest pain.  We will plan to ensure that the patient is clinically sober, able to drink fluids, and ambulate prior to discharge from the ER.  Patient ambulated by RN without ataxia.  He has no slurred speech and has been tolerating fluids at the bedside without difficulty.  Patient appears clinically sober and is safe for discharge to home with outpatient follow-up as needed.  ER return precautions given.  He is hemodynamically stable at time of discharge.    Frederik Pear A, PA-C 10/20/19 [phone]  Mesner, Corene Cornea, MD 10/21/19 [phone]

## 2019-10-20 NOTE — ED Notes (Signed)
This tech attempted to ambulate pt, pt appeared unsteady and promptly sat down on bed. Pt stated they felt "dizzy" and that "they should stay a little bit longer, I don't want to chance anything".

## 2020-08-25 IMAGING — CT CT ABD-PELV W/ CM
2 of 5 series · 16 of 46 positions shown, 18 images · IV contrast (omnipaque)
Comparison: Ultrasound 10/04/2017

CLINICAL DATA: Left lower quadrant pain

EXAM:
CT ABDOMEN AND PELVIS WITH CONTRAST
TECHNIQUE: Multidetector CT imaging of the abdomen and pelvis was performed
using the standard protocol following bolus administration of
intravenous contrast.
CONTRAST:  100mL OMNIPAQUE IOHEXOL 300 MG/ML  SOLN

[Series 2: axial st · axial · 0.69mm/px · z∈[-562,-177]mm · 13 of 91 slices shown, 15 images]
[im 7/91  soft-tissue]
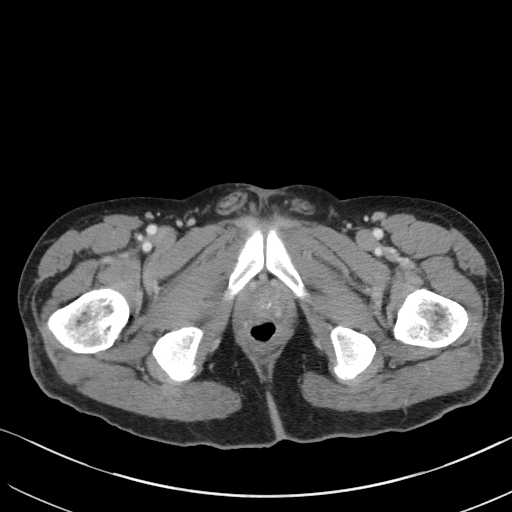
[im 7/91  bone]
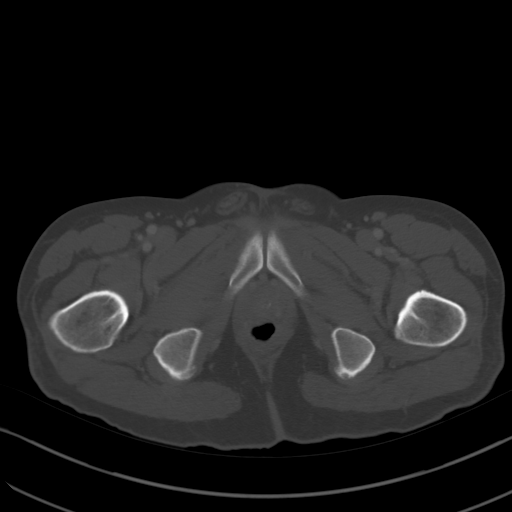
[im 13/91  soft-tissue]
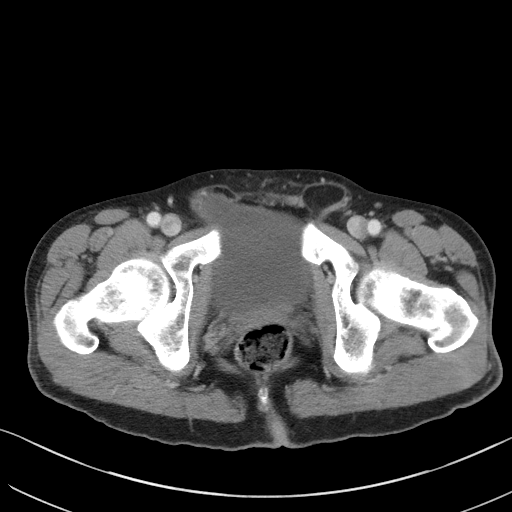
[im 20/91  soft-tissue]
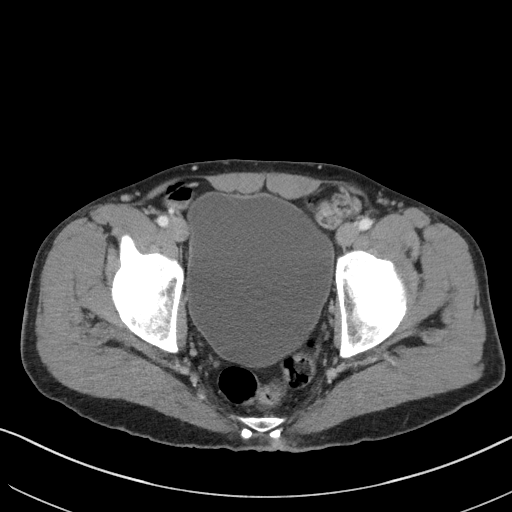
[im 26/91  soft-tissue]
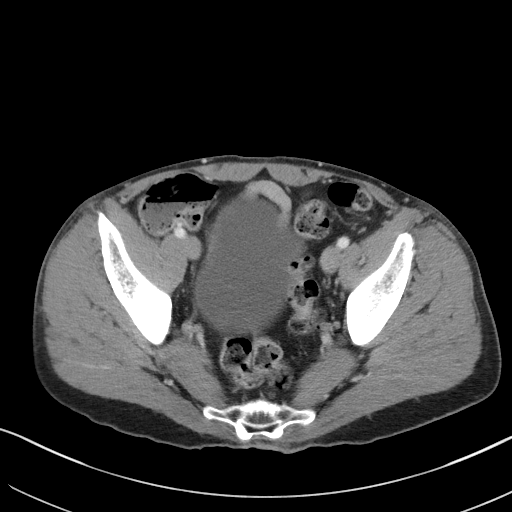
[im 33/91  soft-tissue]
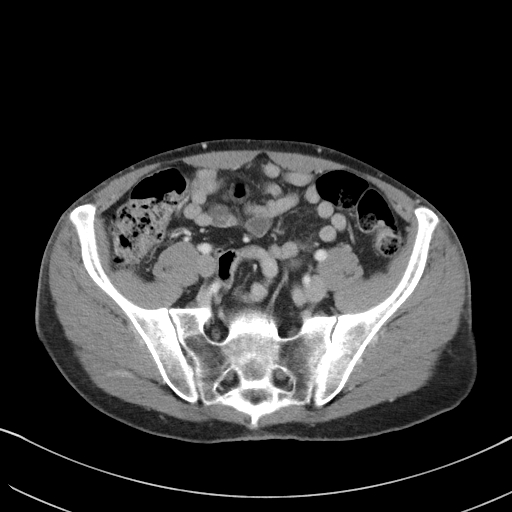
[im 39/91  soft-tissue]
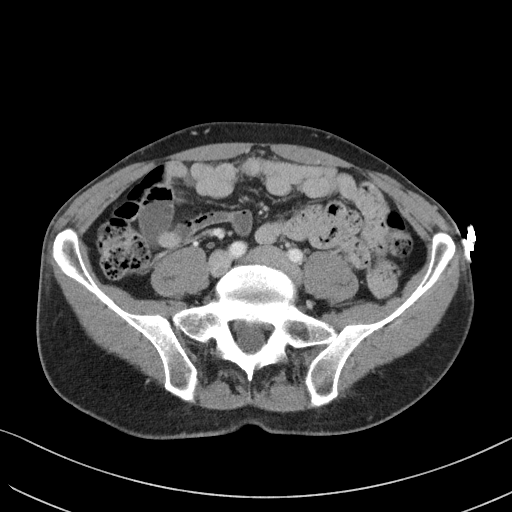
[im 46/91  soft-tissue]
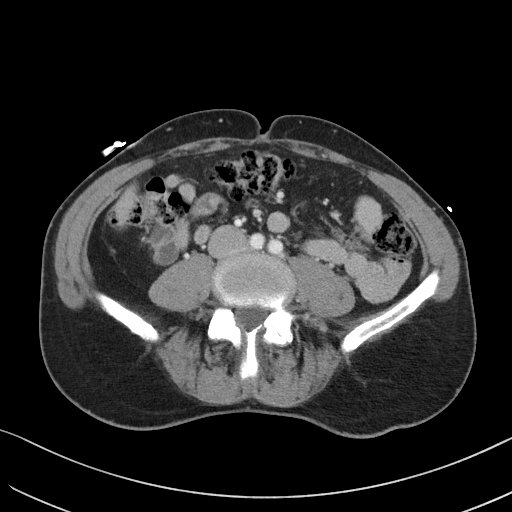
[im 52/91  soft-tissue]
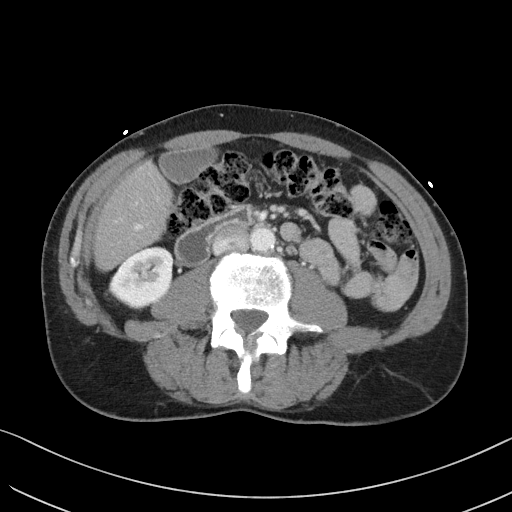
[im 58/91  soft-tissue]
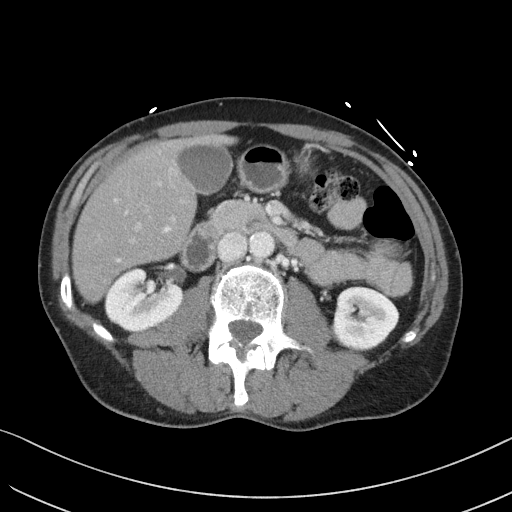
[im 58/91  bone]
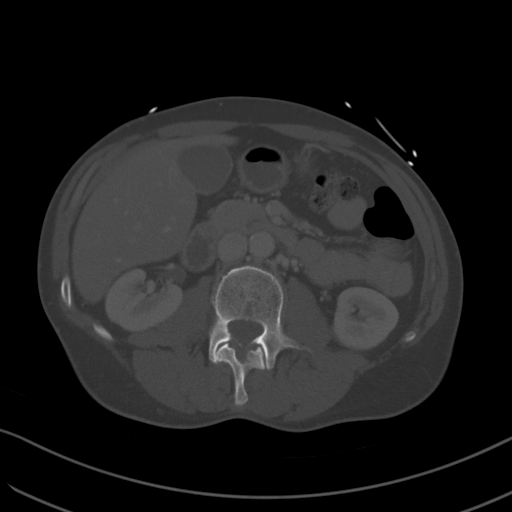
[im 65/91  soft-tissue]
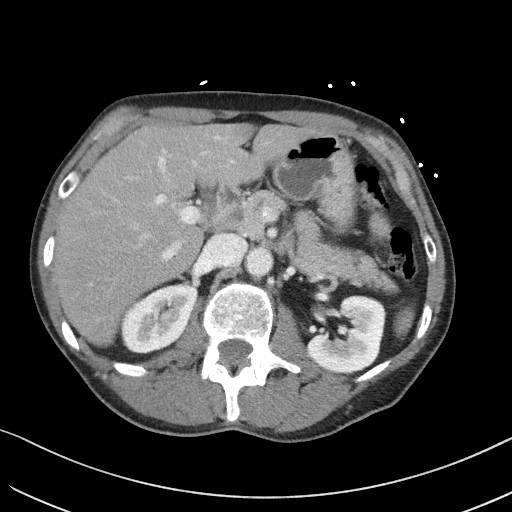
[im 71/91  soft-tissue]
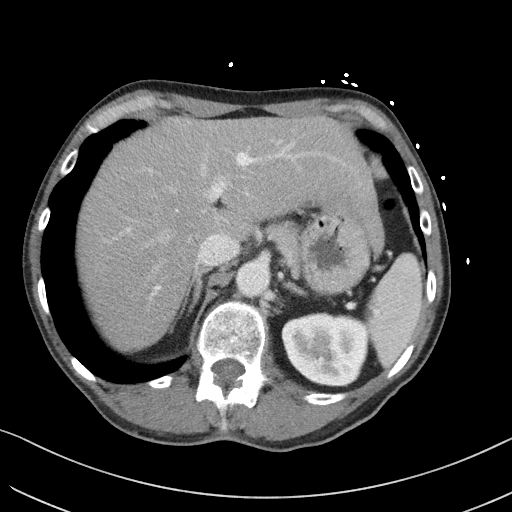
[im 78/91  soft-tissue]
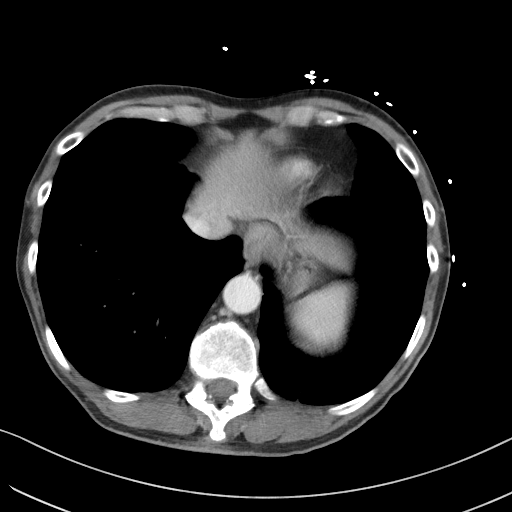
[im 84/91  soft-tissue]
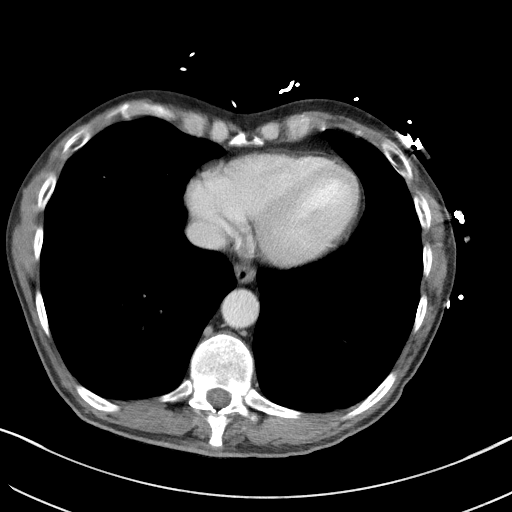

[Series 5: coronal st · coronal · 0.60mm/px · 3 of 76 slices shown]
[im 26/76  soft-tissue]
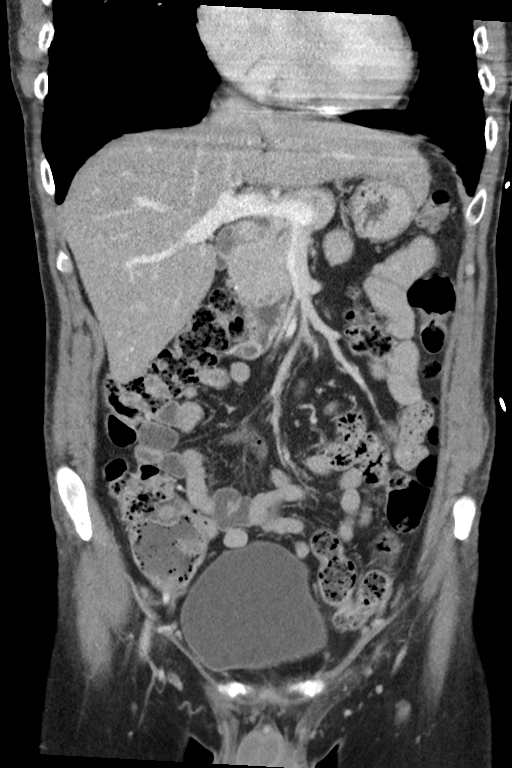
[im 34/76  soft-tissue]
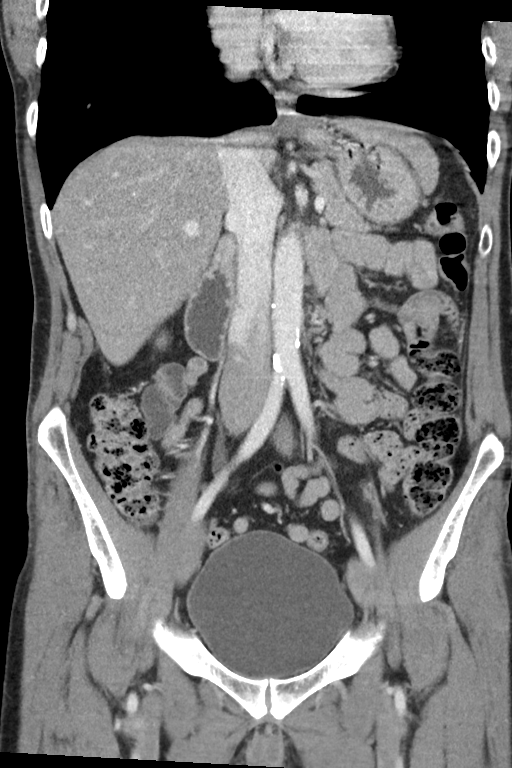
[im 42/76  soft-tissue]
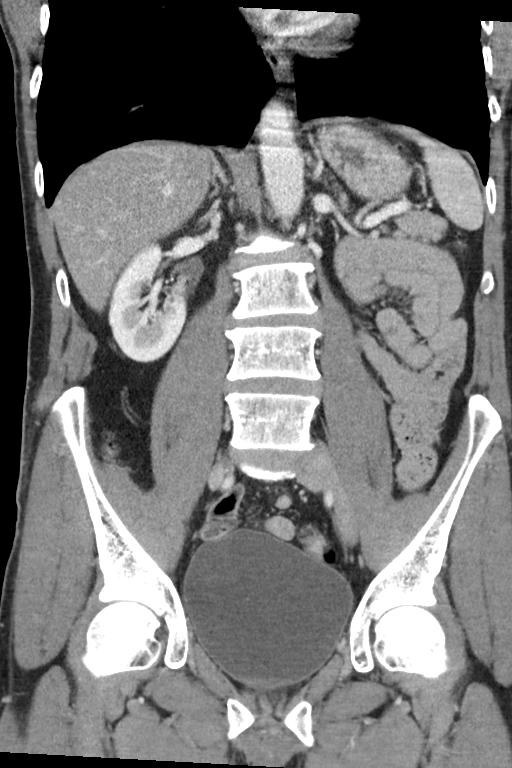

[16 of 46 positions shown; findings below may reference images not displayed]

FINDINGS: Lower chest: Lung bases are clear. No effusions. Heart is normal
size.

Hepatobiliary: Diffuse low-density throughout the liver compatible
with fatty infiltration. No focal abnormality. Gallbladder
unremarkable.

Pancreas: No focal abnormality or ductal dilatation.

Spleen: No focal abnormality.  Normal size.

Adrenals/Urinary Tract: No adrenal abnormality. No focal renal
abnormality. No stones or hydronephrosis. Urinary bladder is
unremarkable.

Stomach/Bowel: Normal appendix. Stomach, large and small bowel
grossly unremarkable.

Vascular/Lymphatic: Aortic atherosclerosis. No enlarged abdominal or
pelvic lymph nodes.

Reproductive: No visible focal abnormality.

Other: No free fluid or free air.

Musculoskeletal: No acute bony abnormality.
IMPRESSION: Fatty infiltration of the liver.

Aortic atherosclerosis.

No acute findings.

## 2021-01-08 IMAGING — CR DG CHEST 2V
2 series · 2 of 2 positions shown · non-contrast
Comparison: 10/03/2019

CLINICAL DATA: Chest pain

EXAM:
CHEST - 2 VIEW

[chest lat]
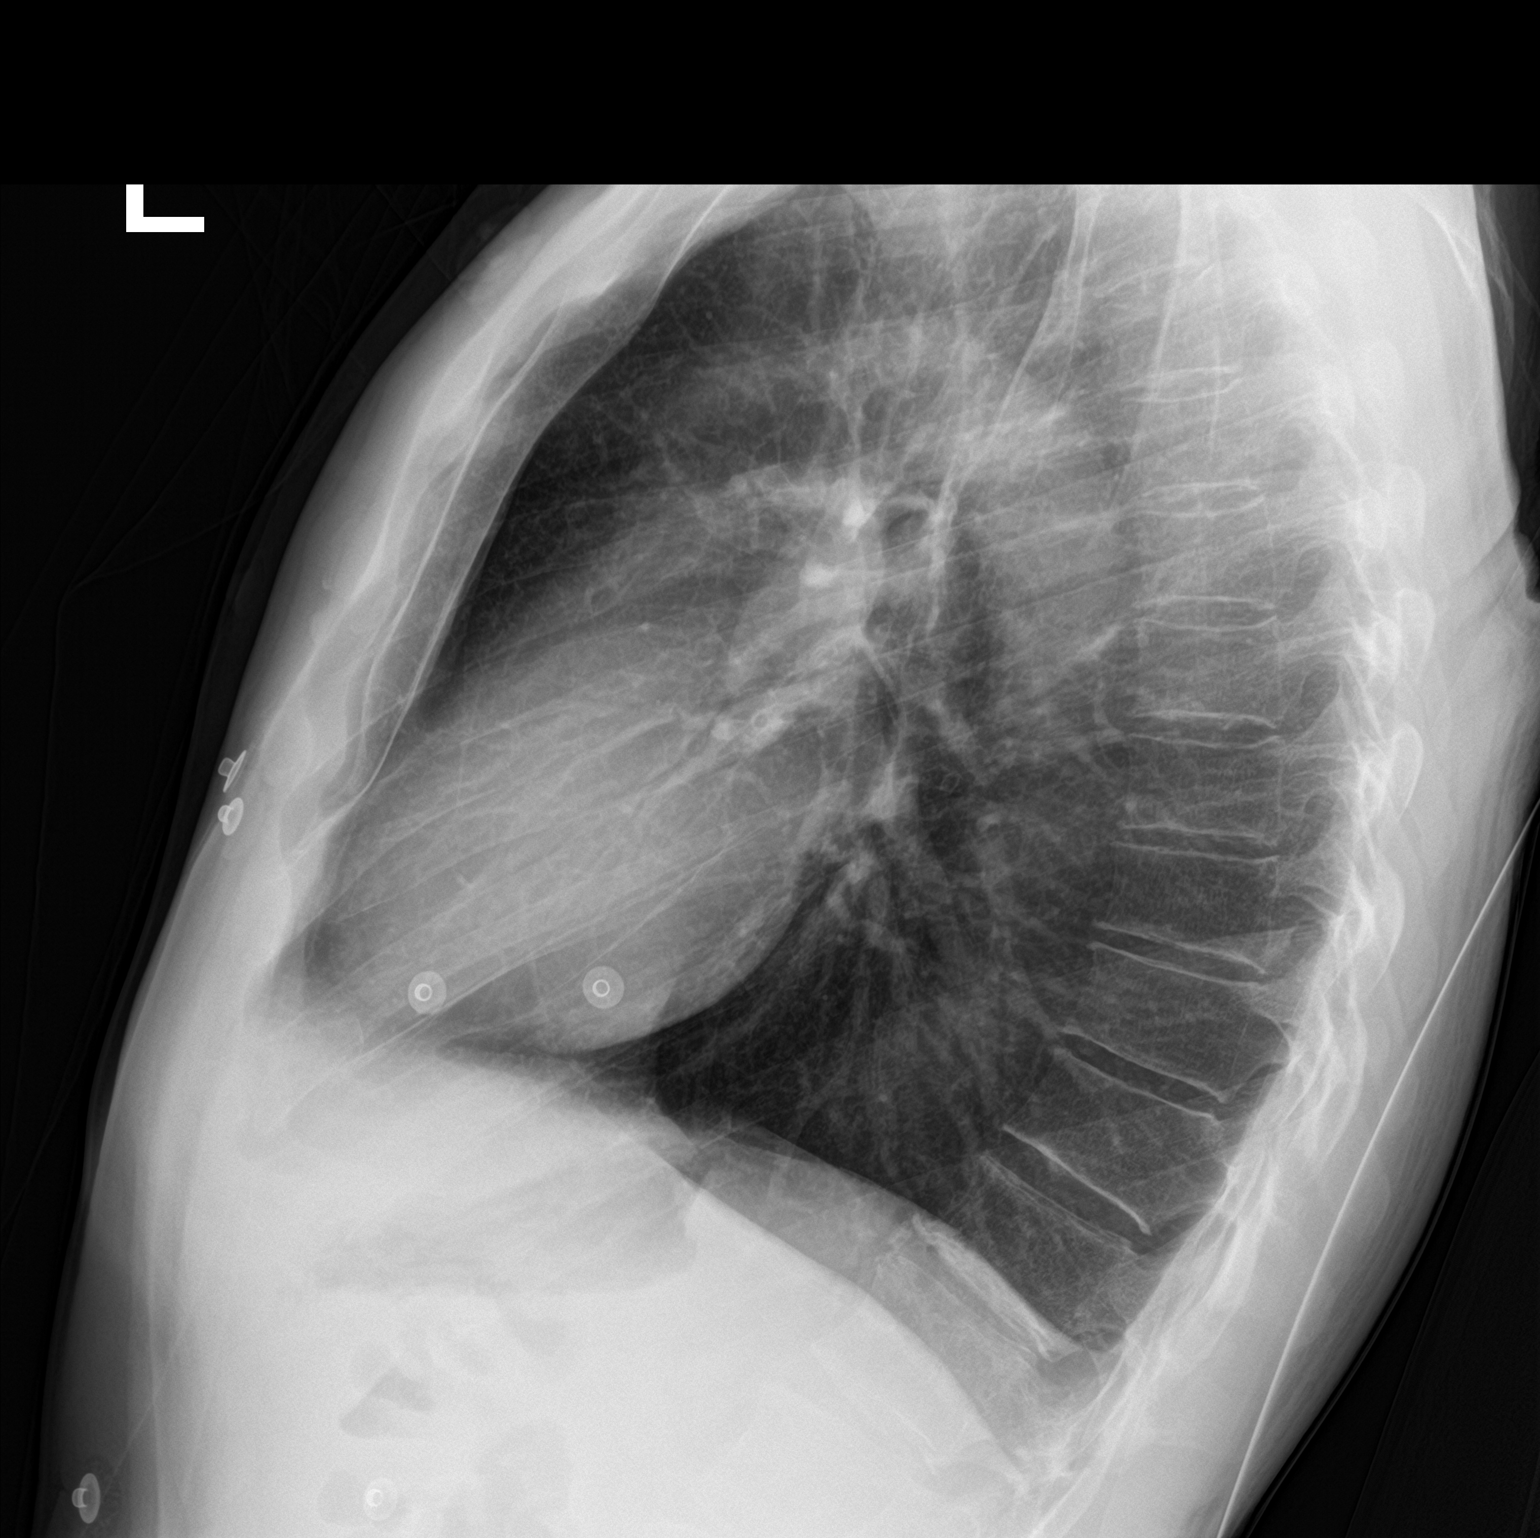

[chest pa]
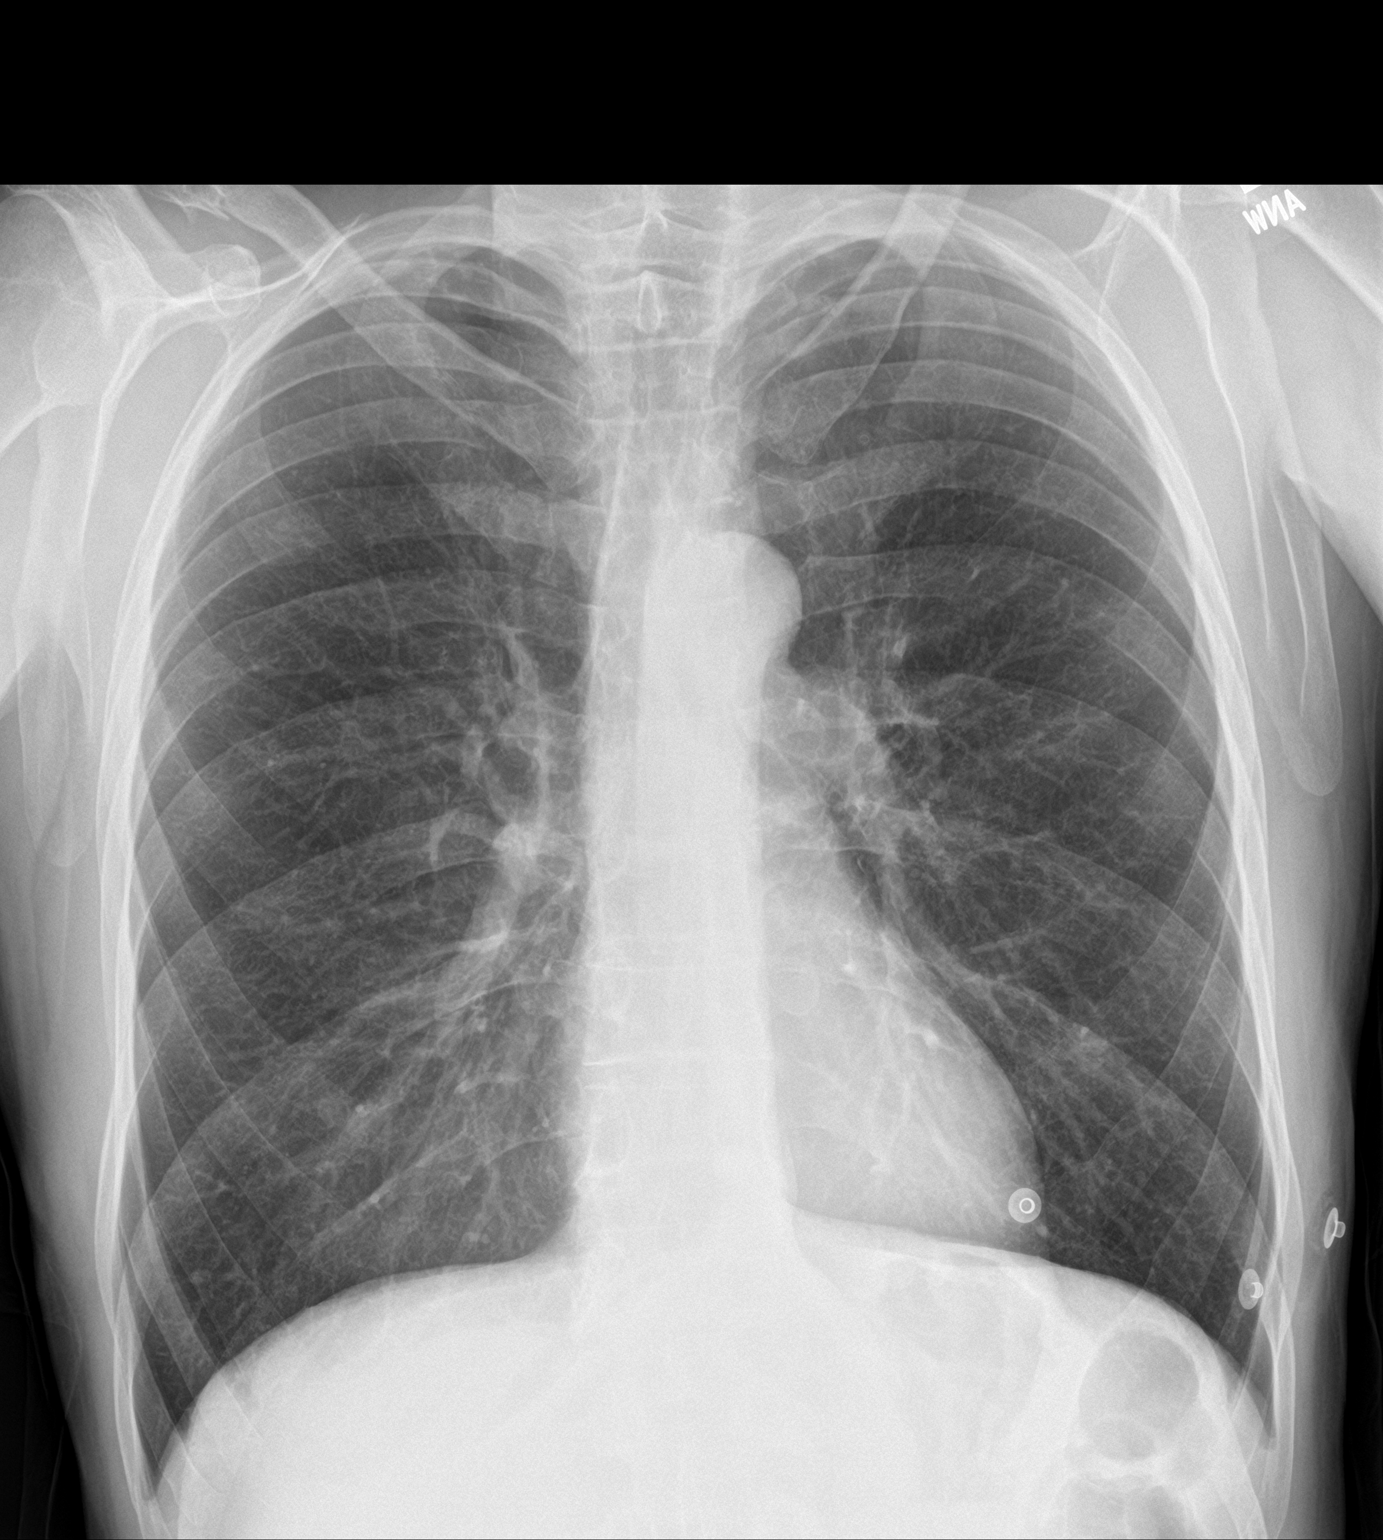

[2 of 2 positions shown; findings below may reference images not displayed]

FINDINGS: The heart size and mediastinal contours are within normal limits.
Both lungs are clear. The visualized skeletal structures are
unremarkable.
IMPRESSION: No active cardiopulmonary disease.
